# Patient Record
Sex: Male | Born: 1935 | Race: Black or African American | Hispanic: No | Marital: Married | Smoking: Never smoker
Health system: Southern US, Community
[De-identification: ages and names within clinical notes are randomized; demographics above are authoritative.]

## PROBLEM LIST (undated history)

## (undated) DIAGNOSIS — I10 Essential (primary) hypertension: Secondary | ICD-10-CM

## (undated) DIAGNOSIS — I509 Heart failure, unspecified: Secondary | ICD-10-CM

## (undated) DIAGNOSIS — N400 Enlarged prostate without lower urinary tract symptoms: Secondary | ICD-10-CM

## (undated) DIAGNOSIS — E119 Type 2 diabetes mellitus without complications: Secondary | ICD-10-CM

## (undated) DIAGNOSIS — E785 Hyperlipidemia, unspecified: Secondary | ICD-10-CM

## (undated) HISTORY — DX: Essential (primary) hypertension: I10

## (undated) HISTORY — DX: Type 2 diabetes mellitus without complications: E11.9

## (undated) HISTORY — DX: Hyperlipidemia, unspecified: E78.5

## (undated) HISTORY — PX: STOMACH SURGERY: SHX791

## (undated) HISTORY — PX: BACK SURGERY: SHX140

## (undated) HISTORY — PX: CARDIAC SURGERY: SHX584

---

## 2007-02-26 ENCOUNTER — Ambulatory Visit: Payer: Self-pay | Admitting: Internal Medicine

## 2007-03-06 ENCOUNTER — Ambulatory Visit: Payer: Self-pay | Admitting: Family Medicine

## 2007-03-08 ENCOUNTER — Ambulatory Visit: Payer: Self-pay | Admitting: Family Medicine

## 2007-03-12 ENCOUNTER — Ambulatory Visit: Payer: Self-pay | Admitting: Internal Medicine

## 2007-03-13 ENCOUNTER — Ambulatory Visit: Payer: Self-pay | Admitting: Family Medicine

## 2007-03-27 ENCOUNTER — Encounter (INDEPENDENT_AMBULATORY_CARE_PROVIDER_SITE_OTHER): Payer: Self-pay | Admitting: Neurosurgery

## 2007-03-27 ENCOUNTER — Inpatient Hospital Stay (HOSPITAL_COMMUNITY): Admission: RE | Admit: 2007-03-27 | Discharge: 2007-04-01 | Payer: Self-pay | Admitting: Neurosurgery

## 2007-03-29 ENCOUNTER — Ambulatory Visit: Payer: Self-pay | Admitting: Internal Medicine

## 2007-05-06 ENCOUNTER — Ambulatory Visit: Payer: Self-pay | Admitting: Unknown Physician Specialty

## 2007-05-06 ENCOUNTER — Encounter: Payer: Self-pay | Admitting: Gastroenterology

## 2007-06-11 ENCOUNTER — Ambulatory Visit: Payer: Self-pay | Admitting: Gastroenterology

## 2007-06-12 ENCOUNTER — Ambulatory Visit: Payer: Self-pay | Admitting: Neurosurgery

## 2007-06-14 ENCOUNTER — Ambulatory Visit: Payer: Self-pay | Admitting: Neurosurgery

## 2007-06-20 ENCOUNTER — Encounter: Payer: Self-pay | Admitting: Gastroenterology

## 2007-06-20 ENCOUNTER — Ambulatory Visit (HOSPITAL_COMMUNITY): Admission: RE | Admit: 2007-06-20 | Discharge: 2007-06-20 | Payer: Self-pay | Admitting: Gastroenterology

## 2007-06-28 ENCOUNTER — Ambulatory Visit: Payer: Self-pay | Admitting: Gastroenterology

## 2007-07-30 ENCOUNTER — Ambulatory Visit: Payer: Self-pay | Admitting: Gastroenterology

## 2007-09-18 DIAGNOSIS — I219 Acute myocardial infarction, unspecified: Secondary | ICD-10-CM | POA: Insufficient documentation

## 2007-09-18 DIAGNOSIS — I10 Essential (primary) hypertension: Secondary | ICD-10-CM | POA: Insufficient documentation

## 2007-09-18 DIAGNOSIS — D481 Neoplasm of uncertain behavior of connective and other soft tissue: Secondary | ICD-10-CM | POA: Insufficient documentation

## 2007-09-18 DIAGNOSIS — E119 Type 2 diabetes mellitus without complications: Secondary | ICD-10-CM | POA: Insufficient documentation

## 2007-09-27 ENCOUNTER — Inpatient Hospital Stay (HOSPITAL_COMMUNITY): Admission: RE | Admit: 2007-09-27 | Discharge: 2007-10-01 | Payer: Self-pay | Admitting: General Surgery

## 2007-09-27 ENCOUNTER — Encounter (INDEPENDENT_AMBULATORY_CARE_PROVIDER_SITE_OTHER): Payer: Self-pay | Admitting: General Surgery

## 2007-10-26 ENCOUNTER — Emergency Department: Payer: Self-pay | Admitting: Internal Medicine

## 2007-10-26 ENCOUNTER — Inpatient Hospital Stay (HOSPITAL_COMMUNITY): Admission: EM | Admit: 2007-10-26 | Discharge: 2007-11-07 | Payer: Self-pay | Admitting: Emergency Medicine

## 2007-10-30 ENCOUNTER — Encounter (INDEPENDENT_AMBULATORY_CARE_PROVIDER_SITE_OTHER): Payer: Self-pay | Admitting: General Surgery

## 2008-01-06 ENCOUNTER — Ambulatory Visit: Payer: Self-pay | Admitting: Neurosurgery

## 2008-03-26 ENCOUNTER — Ambulatory Visit: Payer: Self-pay

## 2008-05-13 ENCOUNTER — Encounter: Payer: Self-pay | Admitting: Gastroenterology

## 2008-10-05 ENCOUNTER — Ambulatory Visit: Payer: Self-pay

## 2008-10-13 IMAGING — CT CT CHEST-ABD-PELV W/ CM
1 of 3 series · 14 of 32 positions shown, 19 images · non-contrast
Comparison: none

REASON FOR EXAM: lumbar mass at L-3 and L-4  eval for metastatic disease
for chest
COMMENTS:

[Series 2: soft tissue · axial · 0.79mm/px · z∈[-69,+441]mm · 14 of 116 slices shown, 19 images]
[im 7/116  soft-tissue]
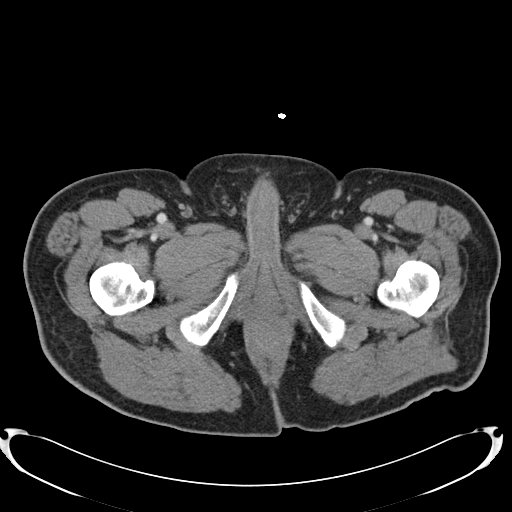
[im 7/116  bone]
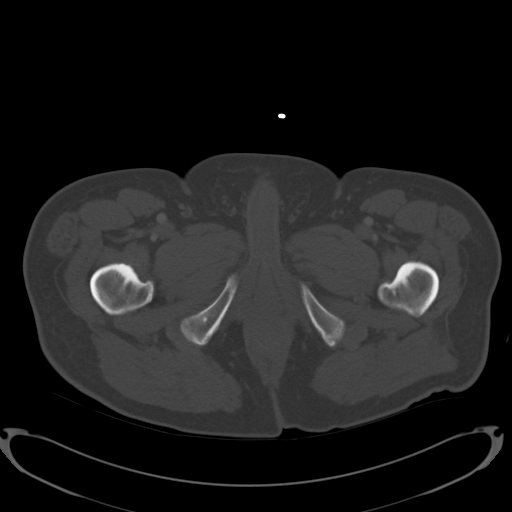
[im 19/116  soft-tissue]
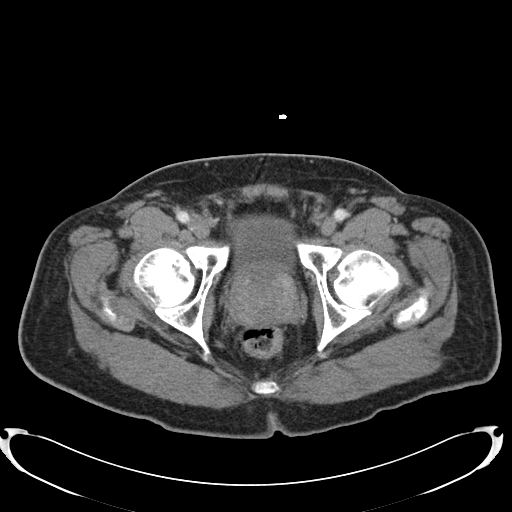
[im 25/116  soft-tissue]
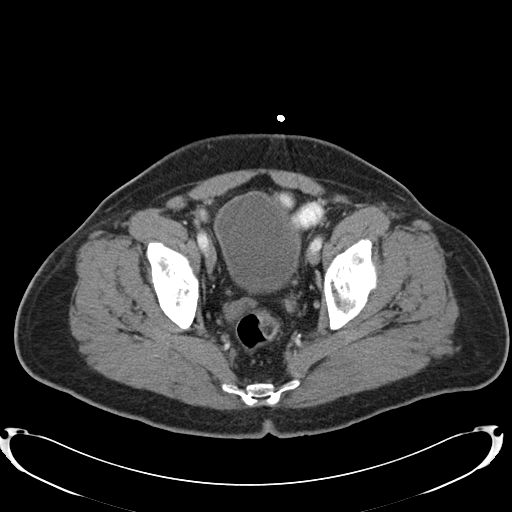
[im 31/116  soft-tissue]
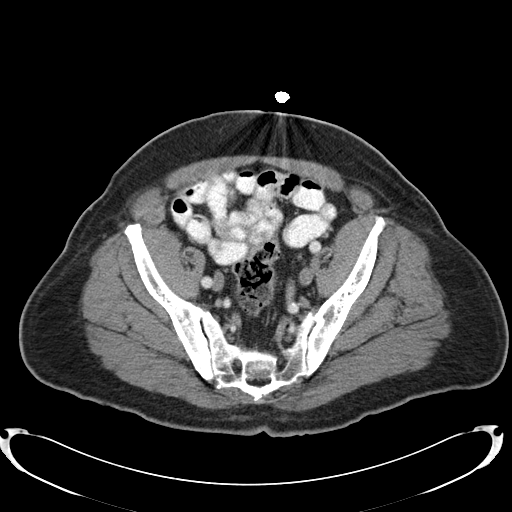
[im 43/116  soft-tissue]
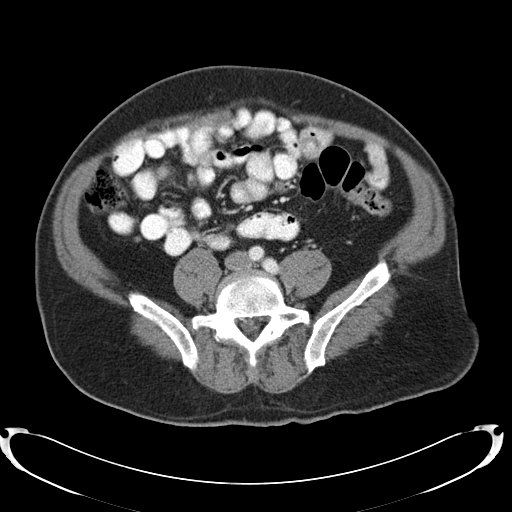
[im 49/116  soft-tissue]
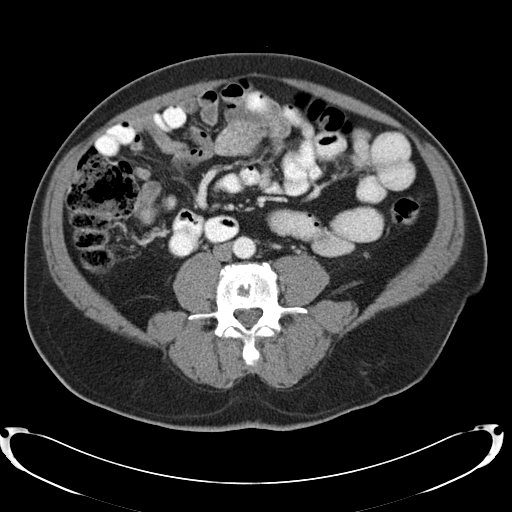
[im 61/116  soft-tissue]
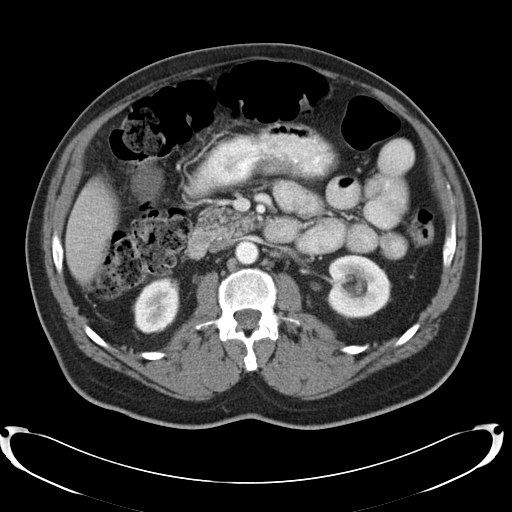
[im 67/116  soft-tissue]
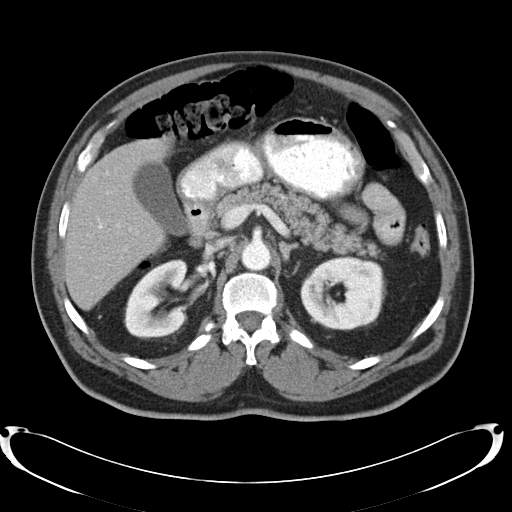
[im 73/116  soft-tissue]
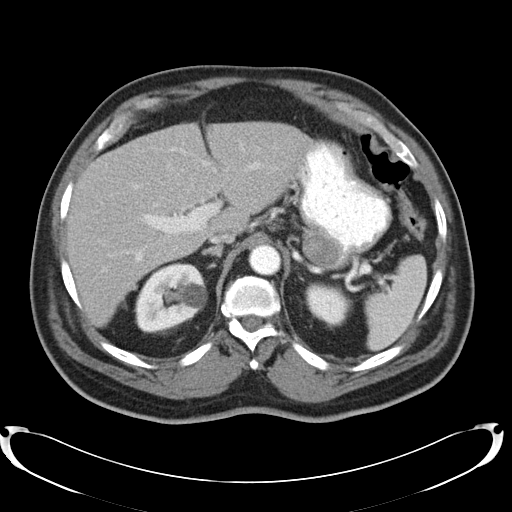
[im 73/116  bone]
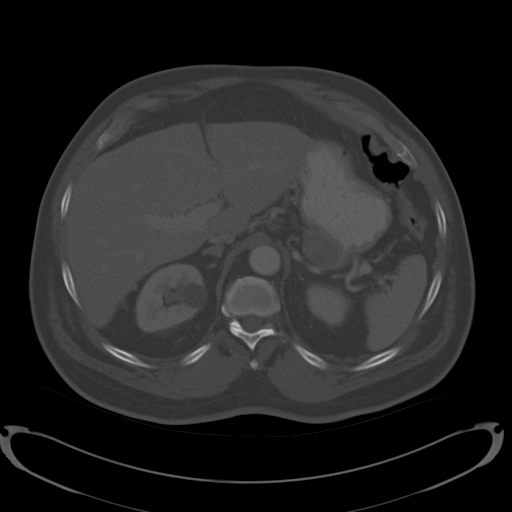
[im 85/116  soft-tissue]
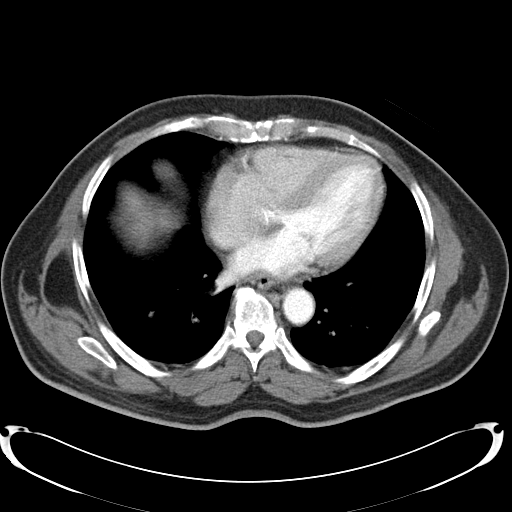
[im 91/116  soft-tissue]
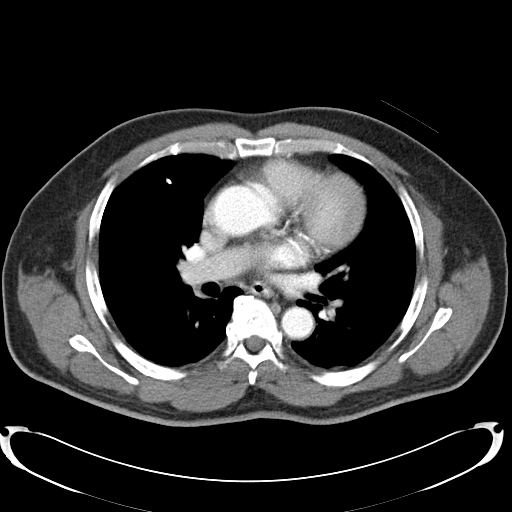
[im 91/116  lung]
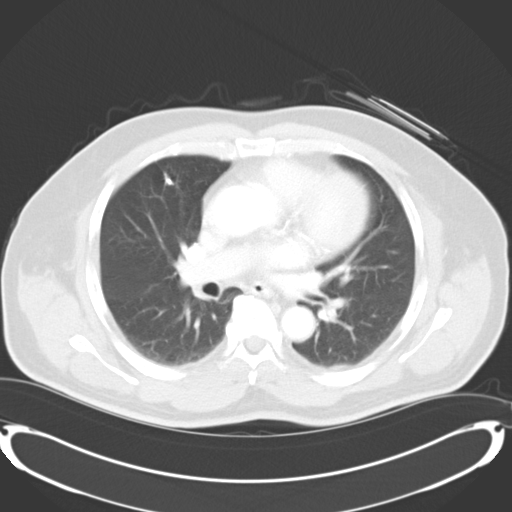
[im 97/116  soft-tissue]
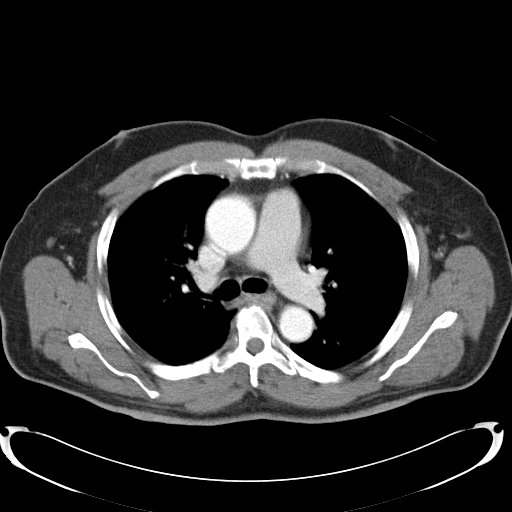
[im 97/116  lung]
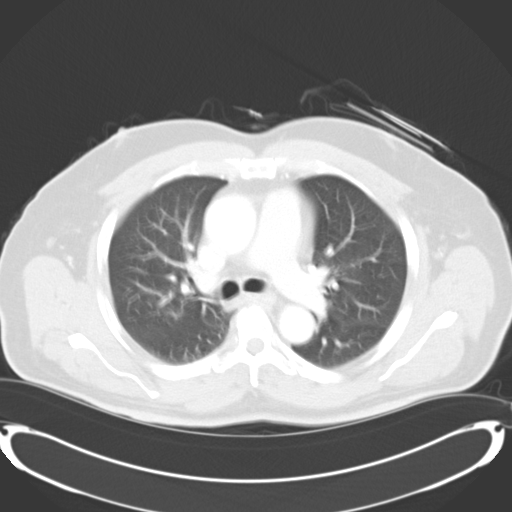
[im 103/116  lung]
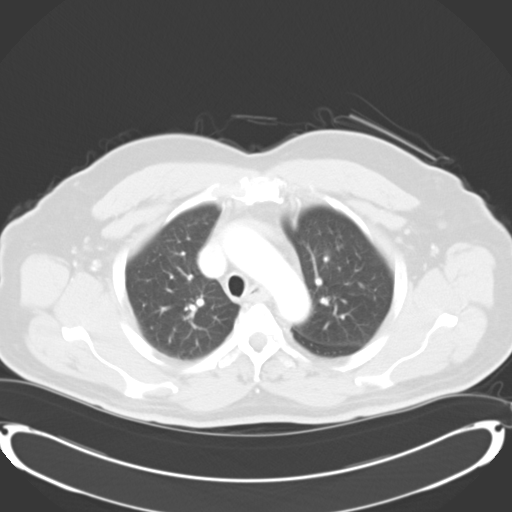
[im 109/116  soft-tissue]
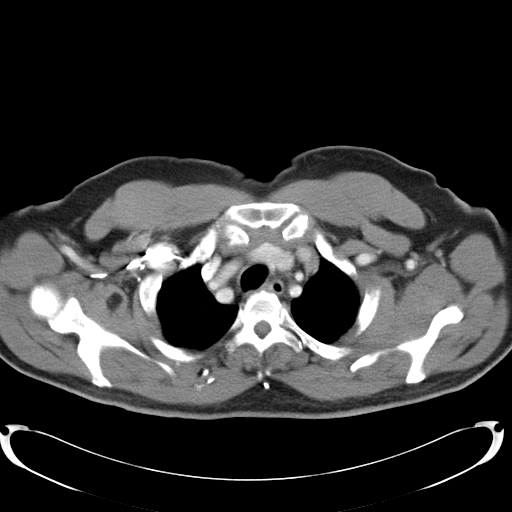
[im 109/116  lung]
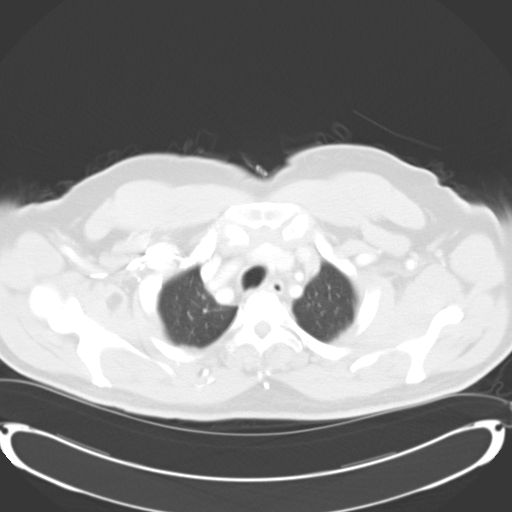

[14 of 32 positions shown; findings below may reference images not displayed]

PROCEDURE:     CT  - CT CHEST ABDOMEN AND PELVIS W  - March 13, 2007  [DATE]

RESULT:     CT of the chest, abdomen and pelvis with oral contrast and
intravenous contrast is performed. The patient has recent abnormal MRI of
the lumbar spine. Images of the chest demonstrate calcification in the RIGHT
upper lobe. There is no additional pulmonary abnormality. There is no
pleural effusion. There is what appears to be a nodular mass in the gastric
region near the fundus measuring 2.54 x 1.86 cm on image 44. Endoscopic
correlation with consideration for biopsy is recommended. This is in the
area of the gastric cardia near the gastroesophageal junction. The pancreas
appears to be normal. The gallbladder is present. No stones are present.
There is a cyst in the upper pole medial aspect of the RIGHT kidney. The
liver and spleen appear to be unremarkable. Degenerative spurring is present
in the thoracic spine. No abnormal bowel distention is seen. There is no
acute inflammatory change evident. The prostate appears to be enlarged.
There is fat within inguinal hernias bilaterally. There is a soft tissue
density over the LEFT hip region which could represent injection granuloma
on image 81. The oral contrast did not reach the colon but again no
obstruction or abnormal bowel distention is present.
IMPRESSION: 1. Granulomatous calcification in the RIGHT lung.
2. RIGHT renal  cyst.
3. Possible mass in the gastric cardia region. Endoscopic correlation is
suggested. No mesenteric or retroperitoneal adenopathy is appreciated.
3. Prostatic enlargement.
4. Fat within inguinal hernias.
5. The evaluation of the lungs is limited by respiratory motion artifact.

## 2008-11-04 ENCOUNTER — Encounter: Payer: Self-pay | Admitting: Gastroenterology

## 2008-11-04 ENCOUNTER — Telehealth (INDEPENDENT_AMBULATORY_CARE_PROVIDER_SITE_OTHER): Payer: Self-pay | Admitting: *Deleted

## 2008-11-11 ENCOUNTER — Ambulatory Visit: Payer: Self-pay | Admitting: Neurosurgery

## 2008-12-04 ENCOUNTER — Ambulatory Visit: Payer: Self-pay | Admitting: Gastroenterology

## 2008-12-11 ENCOUNTER — Encounter: Payer: Self-pay | Admitting: Gastroenterology

## 2008-12-11 ENCOUNTER — Ambulatory Visit: Payer: Self-pay | Admitting: Gastroenterology

## 2008-12-14 ENCOUNTER — Encounter: Payer: Self-pay | Admitting: Gastroenterology

## 2009-05-24 ENCOUNTER — Encounter: Payer: Self-pay | Admitting: Gastroenterology

## 2009-05-28 IMAGING — CT CT ABD-PELV W/ CM
1 of 3 series · 14 of 32 positions shown, 19 images · non-contrast
Comparison: none

REASON FOR EXAM: Abdominal and pelvic pain
COMMENTS:

[Series 2: abdomen · axial · 0.74mm/px · z∈[-520,-136]mm · 14 of 54 slices shown, 19 images]
[im 3/54  soft-tissue]
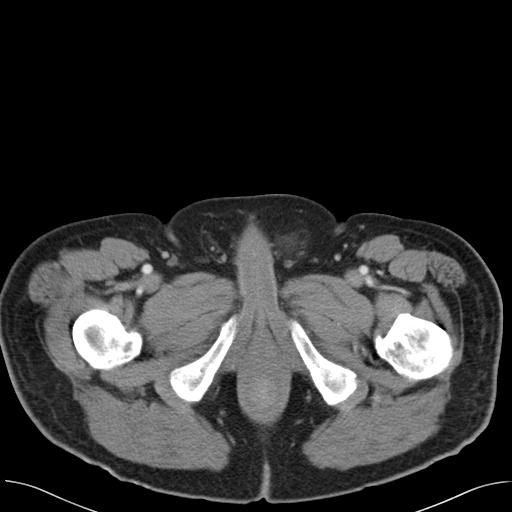
[im 3/54  bone]
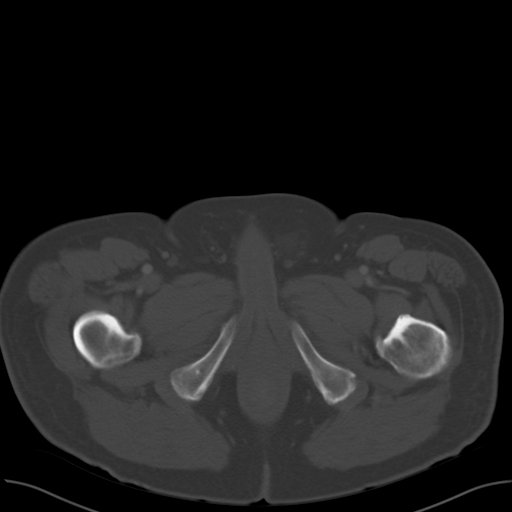
[im 9/54  soft-tissue]
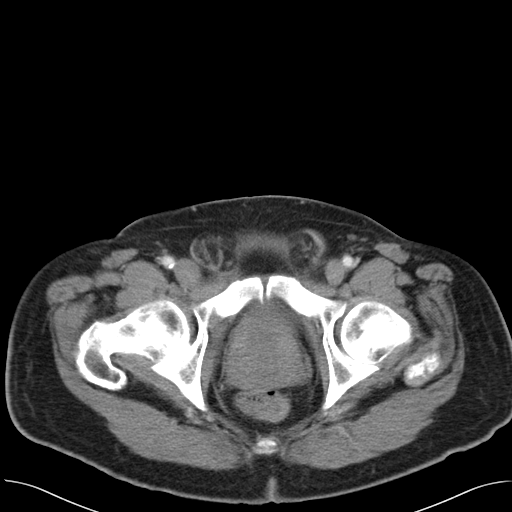
[im 12/54  soft-tissue]
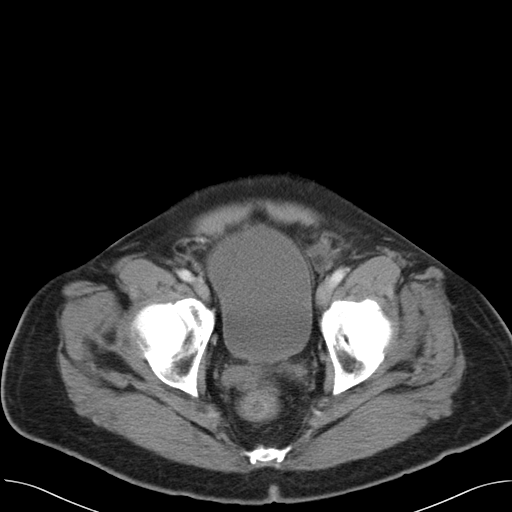
[im 14/54  soft-tissue]
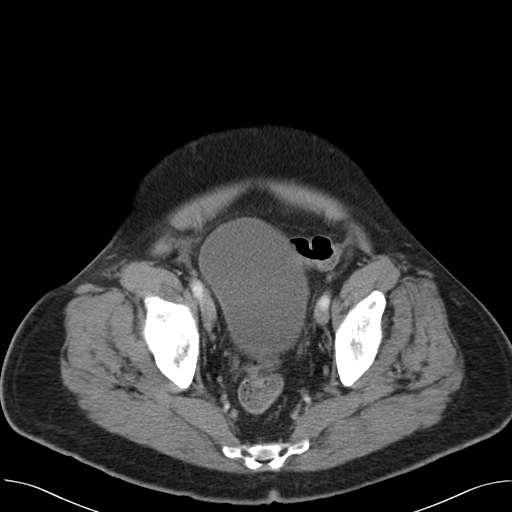
[im 20/54  soft-tissue]
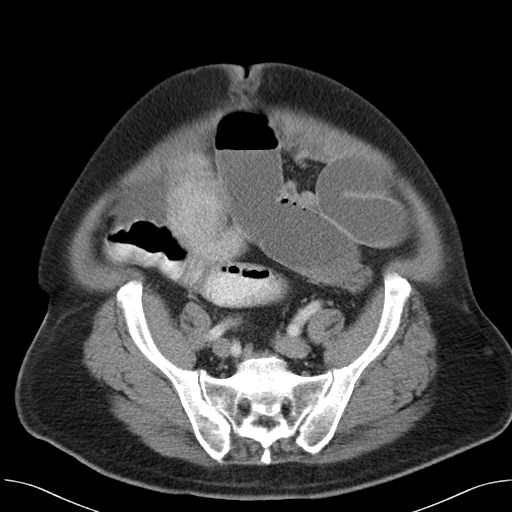
[im 23/54  soft-tissue]
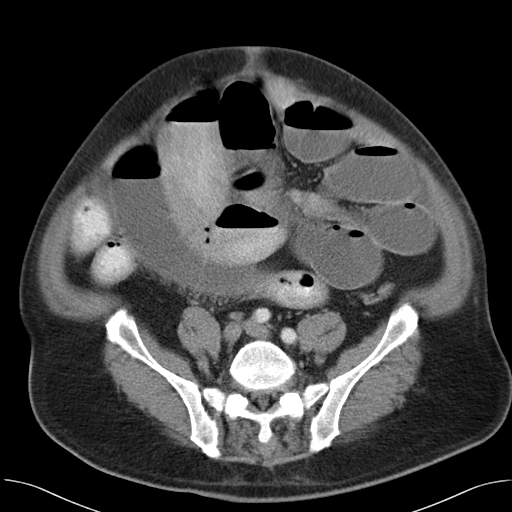
[im 28/54  soft-tissue]
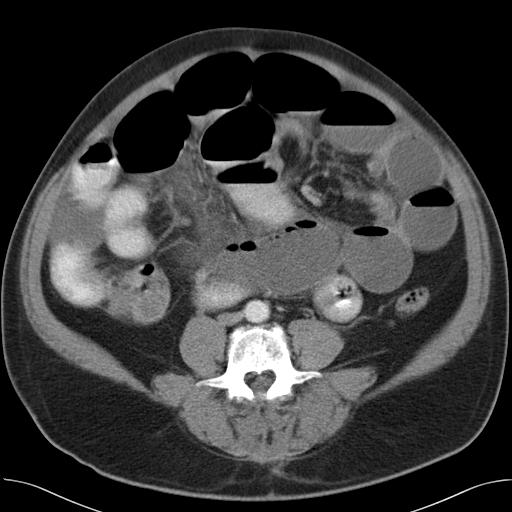
[im 31/54  soft-tissue]
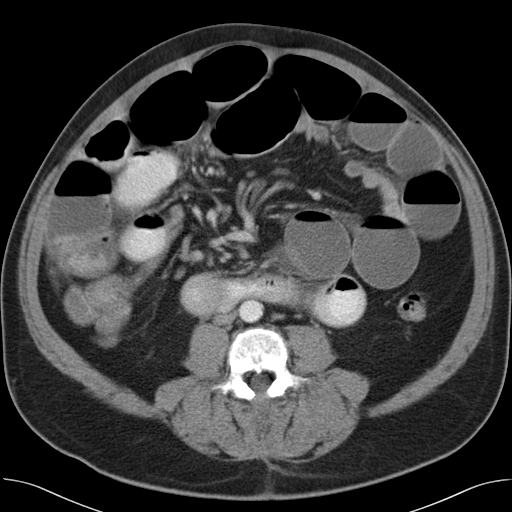
[im 34/54  soft-tissue]
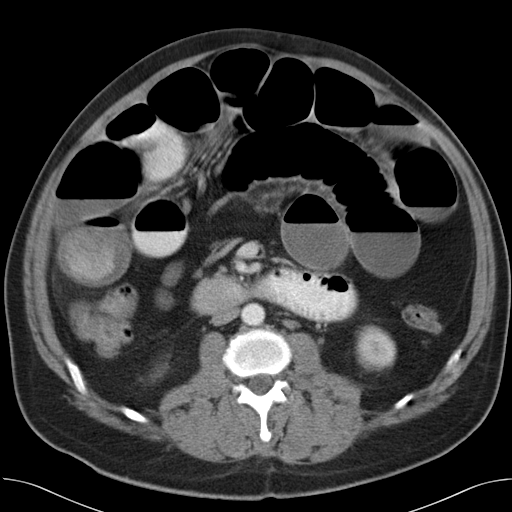
[im 34/54  bone]
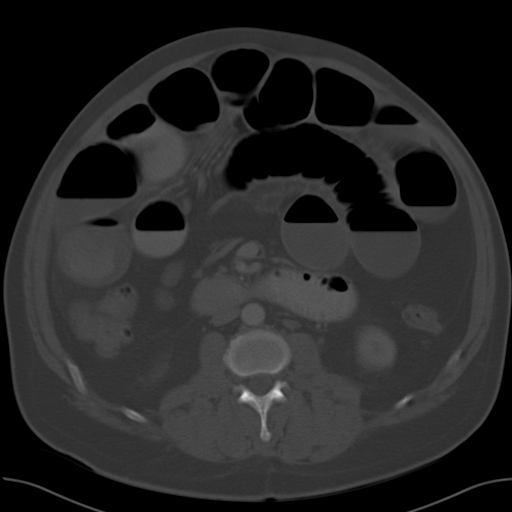
[im 40/54  soft-tissue]
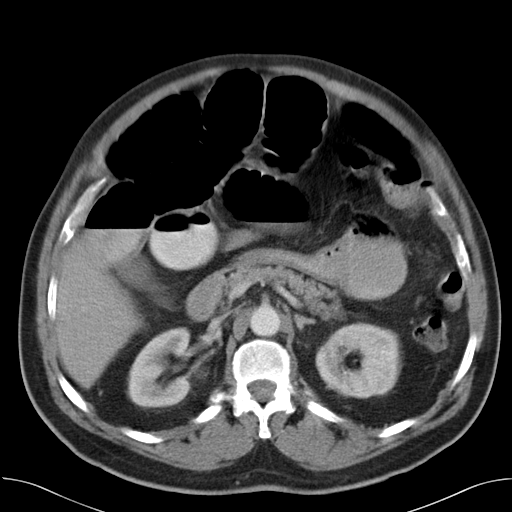
[im 42/54  soft-tissue]
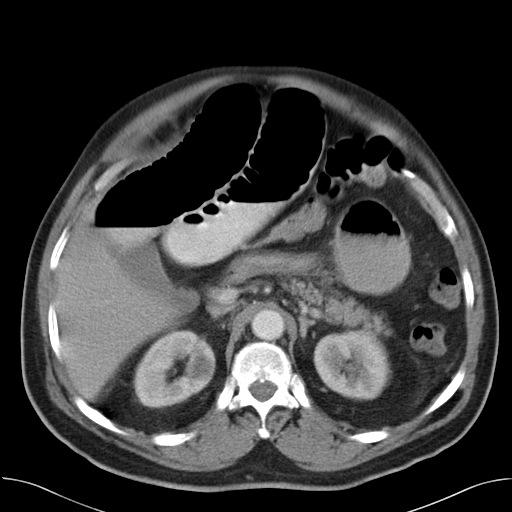
[im 42/54  lung]
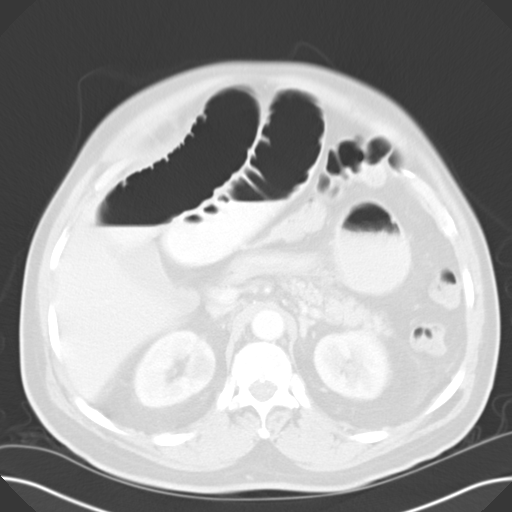
[im 45/54  soft-tissue]
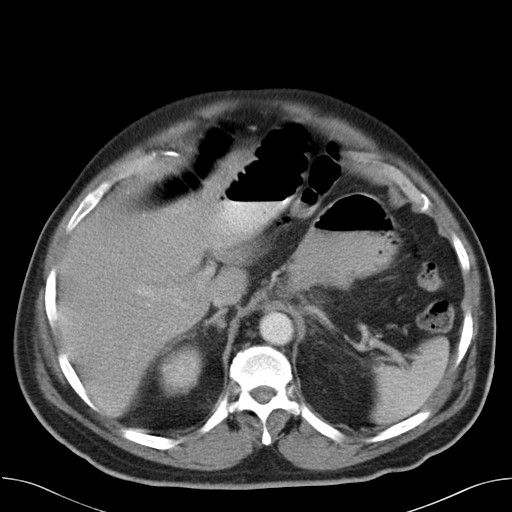
[im 45/54  lung]
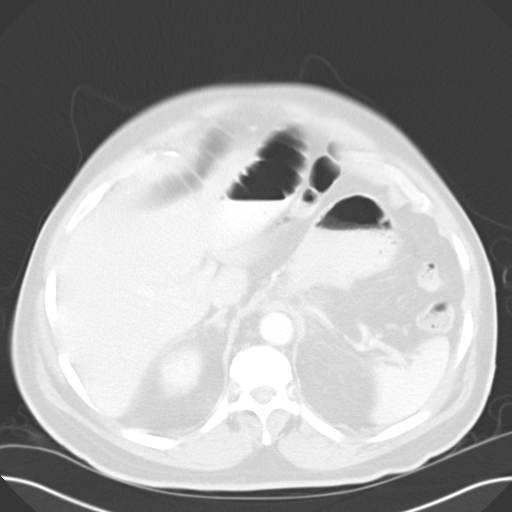
[im 48/54  lung]
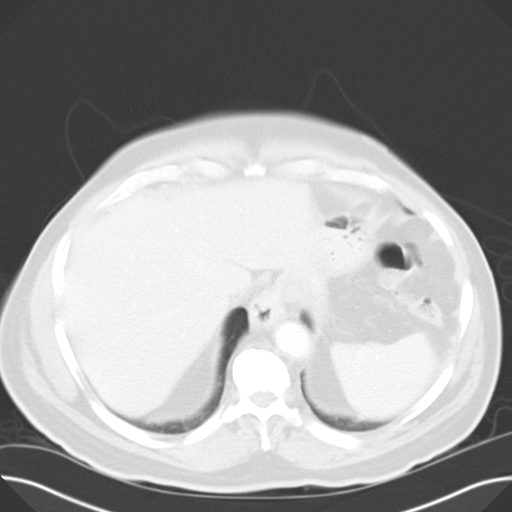
[im 51/54  soft-tissue]
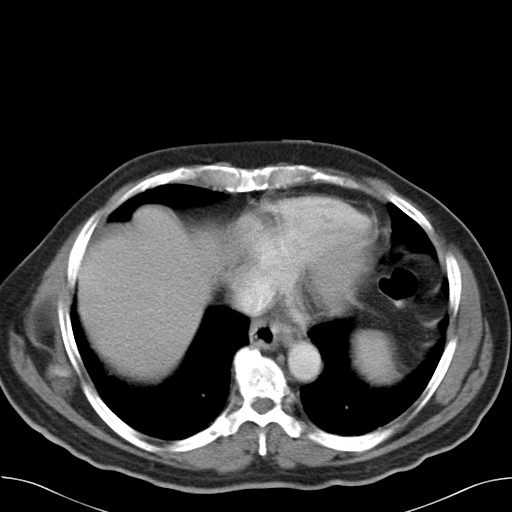
[im 51/54  lung]
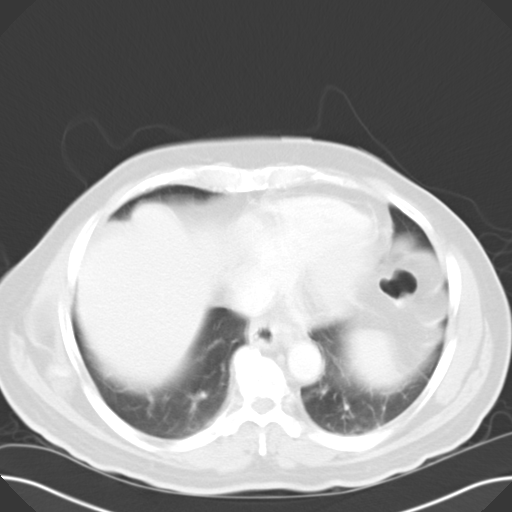

[14 of 32 positions shown; findings below may reference images not displayed]

PROCEDURE:     CT  - CT ABDOMEN / PELVIS  W  - October 26, 2007  [DATE]
RESULT:     Helical, 8.0 mm sections were obtained from the lung bases
through the pubic symphysis status post intravenous administration of 100 ml
of [id number] and oral contrast.

Comparison is made to a prior study dated 03/13/2007.

Evaluation of the lung bases demonstrates mild thickening of the
interstitial markings.

The liver, spleen, adrenals, pancreas and kidneys are unremarkable.

Multiple, dilated fluid filled loops of small bowel are appreciated
throughout the abdomen and pelvis. There are loops of contrast filled bowel
within the proximal portions of the bowel. There appears to be a transition
point within the LEFT lower quadrant. The colon is decompressed.

The celiac, SMA, portal vein, SMV and IMA are opacified. There is no
evidence of abdominal or pelvic masses, free fluid or loculated fluid
collections.
IMPRESSION: 1.     Findings consistent with small bowel obstruction. The transition
point appears to be within the LEFT lower quadrant with the region of the
distal small bowel. There does not appear to be CT evidence of pneumatosis
2.     Dr. Giuliano of the Emergency Department was informed of these
findings at the time of the initial interpretation.
3.     There is no evidence of free air. No evidence of masses, adenopathy,
free fluid or loculated fluid collections is appreciated.
4.     Note, an etiology such as an adhesion is a diagnostic consideration,
particularly if the patient has a history of prior surgery.

## 2009-06-01 ENCOUNTER — Ambulatory Visit: Payer: Self-pay | Admitting: General Surgery

## 2009-12-30 ENCOUNTER — Ambulatory Visit: Payer: Self-pay | Admitting: Internal Medicine

## 2010-01-26 ENCOUNTER — Ambulatory Visit: Payer: Self-pay | Admitting: Internal Medicine

## 2010-02-25 ENCOUNTER — Ambulatory Visit: Payer: Self-pay | Admitting: Internal Medicine

## 2010-05-20 ENCOUNTER — Ambulatory Visit: Payer: Self-pay | Admitting: General Surgery

## 2010-06-01 ENCOUNTER — Encounter: Payer: Self-pay | Admitting: Gastroenterology

## 2010-09-28 NOTE — Letter (Signed)
Summary: [facility]   Imported By: Phillis Knack 06/15/2010 14:54:27  _____________________________________________________________________  External Attachment:    Type:   Image     Comment:   External Document

## 2010-12-07 LAB — GLUCOSE, CAPILLARY
Glucose-Capillary: 154 mg/dL — ABNORMAL HIGH (ref 70–99)
Glucose-Capillary: 172 mg/dL — ABNORMAL HIGH (ref 70–99)

## 2011-01-10 NOTE — Op Note (Signed)
Bill Peterson, Bill Peterson NO.:  [id number]   MEDICAL RECORD NO.:  [id number]          PATIENT TYPE:  INP   LOCATION:  5036                         FACILITY:  [facility]   PHYSICIAN:  Odis Hollingshead, M.D.DATE OF BIRTH:  1934-11-10   DATE OF PROCEDURE:  10/30/2007  DATE OF DISCHARGE:                               OPERATIVE REPORT   PRESENTING PROBLEM:  Small-bowel obstruction.   POSTOPERATIVE DIAGNOSIS:  Small-bowel obstruction, secondary to Richter  type hernia.   PROCEDURE:  Exploratory laparotomy, lysis of adhesions, small bowel  resection.   SURGEON:  Odis Hollingshead, M.D.   ASSISTANT:  Sammuel Hines. Daiva Nakayama, M.D.   ANESTHESIA:  General.   INDICATIONS:  This is a 76 year old male who over a month ago underwent  a laparoscopic excision of a gastric tumor and did well until  approximately one week ago when he developed some abdominal bloating and  distention.  Laxatives helped initially but he then began having a  significant amount of vomiting, went to [facility], was found to have a small bowel obstruction and was admitted to  [facility].  He had some clinical improvement until today when he  developed more discomfort and fever and x-rays were unchanged with small  bowel obstruction.  He is now brought to the operating room because of  this.   TECHNIQUE:  He was brought to the operating room, placed supine on the  operating room.  General anesthetic was administered.  Foley catheter  was inserted.  The abdominal wall sterilely prepped and draped.  I made  a limited midline incision beginning above the umbilicus and extending  it below.  He had a previous supraumbilical incision closed with Vicryl  suture.  I dissected subcutaneous tissue and divided the fascia below  the umbilicus.  As I approached the area of scar above the umbilicus,  the tissue was very firm and indurated.  As I dissected through the  tissue I noted small bowel  mucosa.  This was up into the subcutaneous  tissue.  I could see a disrupted suture next to this.  It appears that  with some postop coughing and movement he disrupted the suture and he  had a small antimesenteric section of the small bowel stuck up into the  incision leading to his hernia.  The bowel proximal to this was dilated,  distal this was decompressed.  I freed this segment up.  I subsequently  then took the GIA stapler and performed a side-to-side anastomosis using  the enterotomy that was made.  I then resected dissection with the  enterotomy using a linear non cutting stapler.  The anastomosis was  patent, viable and under no tension.   I then began running the small bowel proximal and there were adhesions  between small bowel loops.  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  I lysed  some adhesions that appeared to be similar to Ladd's type bands, freeing  this up, allowing it to go  back and then rotating the intestine so it  fell back into the right lower quadrant.  I ran the entire small bowel  and milked some of the small bowel contents back to the stomach and  evacuated them decompressing the small bowel.  No other points of  obstruction were noted.  I made sure that small bowel was placed back  into the abdominal cavity in proper orientation with respect with  respect to the mesentery.   I then irrigated out the abdominal cavity copiously with saline solution  and evacuated.  Hemostasis was adequate.   Following this, the fascia was closed with a running #1 PDS suture with  intermittent #2 nylon suture retention type sutures.  Subcutaneous  tissue was irrigated.  Skin was closed with staples.  I then used a red  rubber catheter to form a bridge and the retention sutures were tied  down, further reinforcing the closure.  Sterile dressing was applied.  After closure, the nurses reported that a right angle  clamp was missing  from their count.  We had not used a right angle clamp the entire  operation.  Subsequently an x-ray has been ordered.  I will dictate an  addendum based on the x-ray findings but we do not remember leaving any  the instruments in the abdomen and again a right angle clamp was not  used during the operation.      Odis Hollingshead, M.D.  Electronically Signed     TJR/MEDQ  D:  10/30/2007  T:  10/31/2007  Job:  [id number]

## 2011-01-10 NOTE — Consult Note (Signed)
Bill Peterson, Bill Peterson NO.:  [id number]   MEDICAL RECORD NO.:  [id number]          PATIENT TYPE:  INP   LOCATION:  3034                         FACILITY:  [facility]   PHYSICIAN:  Vernell Leep, MD     DATE OF BIRTH:  10-31-1934   DATE OF CONSULTATION:  03/29/2007  DATE OF DISCHARGE:                                 CONSULTATION   REQUESTING PHYSICIAN:  Dr. Kary Kos of neurosurgery.   PRIMARY CARE PHYSICIAN:  Dr. Frazier Richards of [facility] in  [place], [place].   CARDIOLOGIST:  Dr. Lujean Amel, [place], [place].   REASON FOR CONSULTATION:  Tachycardia and hypertension postoperatively.   CHIEF COMPLAINT:  Mild intermittent low back pain at operated site and  diarrhea.   HISTORY OF PRESENT ILLNESS:  Mr. Mowbray is a pleasant, 76 year old,  African-American male patient with past medical history as indicated  below. He is status post decompressive lumbar laminectomy L2-3, L3-4,  and L4-5 bilaterally with intradural exploration for resection of  intradural mass and then L4-5 microdiskectomy from the left,  postoperative day #2. The patient had an episode of mild ileus yesterday  with associated abdominal stiffness, nausea with a couple of episodes of  vomiting, poor oral intake, hiccups. For this, the patient was provided  with stool softeners and Fleets enemas. Since this morning, the patient  has had multiple (6) episodes of copious liquid stools. His abdominal  stiffness and nausea and vomiting have resolved. He is tolerating clear  liquids orally. The patient still has persistent hiccups. He complains  of some dry mouth. He also complains of mild intermittent low back pain  on trying to turn. His preoperative symptom of left lower extremity pain  has resolved. For persistent sinus tachycardia in the 130s and elevated  blood pressures, we were asked to assist.   PAST MEDICAL HISTORY:  1. Type 2 diabetes with hemoglobin A1c said to be 7.  2.  Hypertension.  3. Hyperlipidemia.  4. Status post myocardial infarction in 1996 status post angioplasty      in 1996.  5. Negative stress test on March 25, 2007.  6. For elevated PSA, the patient had a prostate biopsy which was said      to be negative for malignancy.   PAST SURGICAL HISTORY:  None prior to this surgery.   ALLERGIES:  No known drug allergies.   HOME MEDICATIONS:  1. Multivitamins 1 p.o. daily.  2. Actos 30 mg p.o. daily.  3. Janumet 50/1000, 1 tablet p.o. twice daily.  4. Glipizide XL 5 mg p.o. one daily.  5. Caduet 10/20mg , one p.o. daily.  6. Aspirin 325 mg p.o. daily.  7. Lisinopril 20 mg p.o. daily.  8. Hydrocodone APAP 5/500, one to two tablets p.o. q.4 h p.r.n.   FAMILY HISTORY:  1. mother died at age 42 from a heart attack.  2. father died at age 40 from diabetes complications.   SOCIAL HISTORY:  The patient is married and has 3 adult kids. 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 There is no history of smoking,  alcohol  or drug abuse.   REVIEW OF SYSTEMS:  Over 10 systems reviewed and is noncontributory.  Specifically, the patient denies any history of fever, chills or rigors,  chest pain, palpitations, dyspnea.   PHYSICAL EXAMINATION:  Mr. Hohman is a moderately built, nourished male  patient lying comfortably supine in bed with no obvious distress.  VITAL SIGNS:  His temperature is 99.3 degrees Fahrenheit, pulse of 135  per minute and regular, respirations 20 per minute, blood pressure  179/92. Oxygen saturation of 95% on room air CBG 238/249/145 mg/DL.  HEENT:  Nontraumatic, normocephalic. Pupils equally reacting to light  and accommodation. Bilateral cataracts. Oral cavity with multiple  missing teeth and other teeth with filling. No oral pharyngeal erythema  or thrush.  NECK:  Without JVD, carotid bruit, lymphadenopathy or goiter. Supple.  RESPIRATORY:  Clear to auscultation.  CARDIOVASCULAR:  First and second heart sounds heard.  Tachycardic,  regular rate and rhythm. No third or fourth heart sounds or murmurs or  rubs or gallops or clicks.  ABDOMEN:  Obese but not tender, no organomegaly or mass appreciated.  Bowel sounds are normally heard. Soft yellow stools.  CENTRAL NERVOUS SYSTEM:  The patient is awake, alert, oriented x3 with  no focal neurological deficits.  EXTREMITIES:  No cyanosis, clubbing or edema. Peripheral pulses are  symmetrically felt. The patient has compression stockings to the thighs.  MUSCULOSKELETAL:  Operative site dressing clean, dry and intact.   LABORATORY DATA:  Complete metabolic panel is remarkable for glucose of  266, BUN 14, creatinine 1.31, albumin of 2.9. CBC is remarkable for a  white cell of 11.3, hemoglobin 13.8, hematocrit 40.8, platelet of 176.   The patient's EKG with sinus tachycardia are 135 beats per minute with  some questionable Q waves in inferior lead 3 and lead aVF which is  probably rate related. There are no other acute changes.   ASSESSMENT/PLAN:  1. Asymptomatic sinus tachycardia - probably secondary to dehydration      secondary to poor oral intake, vomiting on July 31 and diarrhea      today secondary to his medications for ileus. Will hold the      laxatives and stool softeners for today. Will increase his IV fluid      hydration. For completion will obtain a TSH level. Follow also for      adequate pain control.  2. Hypertension. To continue the patient's Norvasc and lisinopril.      Agree with adding low dose metoprolol which is yet to be given.      Monitor blood pressure.  3. Uncontrolled type 2 diabetes. The control is probably worse      secondary to his current postoperative stress, recent use of      steroids. Will continue Actos, Januvia, metformin and his sliding      scale insulin. Will add low dose Lantus. To monitor closely his      CBGs.  4. Dyslipidemia. To continue Zocor.  5. Postoperative ileus which is clinically resolved at this time,  to      hold his Colace, Senokot and Fleets enemas for today.  6. History of coronary artery disease status post myocardial      infarction with no cardiorespiratory symptoms and      stable. To resume the patient's enteric coated aspirin as soon as      possible by the primary team.  7. Mild leukocytosis that is probably related to stress with no  clinical focus of sepsis. Will however check the patient's      urinalysis and a chest x-ray.      Vernell Leep, MD  Electronically Signed     AH/MEDQ  D:  03/29/2007  T:  03/29/2007  Job:  [id number]   cc:   Kary Kos, M.D.  Frazier Richards, MD

## 2011-01-10 NOTE — Assessment & Plan Note (Signed)
[facility] OFFICE NOTE   Bill Peterson, Bill Peterson                        MRN:          [id number]  DATE:06/11/2007                            DOB:          1935-04-14    NEW GI CONSULTATION:  Patient was referred by Dr. Gaylyn Cheers from  the [facility] gastroenterology.   PRIMARY CARE PHYSICIAN:  Dr. Ouida Sills at the [facility].   REASON FOR REFERRAL:  Dr. Vira Agar asked me to evaluate Bill Peterson in  consultation regarding subepithelial gastric mass.   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  As workup for this, he had some scans at [facility].  He tells me one of the CAT scans suggested a mass in his upper GI tract.  This is followed by an EGD by Dr. Verta Ellen. This EGD found an  approximately 3 cm subepithelial bulge in his gastric body.  Biopsies of  the overlying mucosa were normal.   Bill Peterson seemed to have no symptoms from this mass.  No nausea.  No  vomiting.  No specific abdominal pain.  He has had no GI bleeding.   REVIEW OF SYSTEMS:  Notable for a weight loss since his back surgery,  otherwise is essentially normal and is available on his nursing intake  sheet.   PAST MEDICAL HISTORY:  1. Hypertension.  2. Status post acute MI in 1996.  3. Diabetes.  4. Status post angioplasty, I believe, in 1996.  5. Back surgery in July, 2008, as above.  6. Sub-epithelial mass, as above.   CURRENT MEDICATIONS:  Actos, aspirin, Caduet, Glucotrol, Janamet,  lisinopril, multivitamin, and omeprazole.   ALLERGIES:  PRANDIN.   SOCIAL HISTORY:  Married with three children.  Nonsmoker, nondrinker.   FAMILY HISTORY:  No colon cancer or colon polyps in the family.   PHYSICAL EXAMINATION:  Height 5 feet 6 inches.   Weight 192 pounds.  Blood pressure 148/82, pulse 76.  CONSTITUTIONAL:  Generally well-appearing.  ABDOMEN:  Soft, nontender, nondistended.  Normal bowel sounds.  EXTREMITIES:  No lower extremity edema.  EYES:  Extraocular movements are intact.  MOUTH:  Oropharynx moist.  No lesions.  NECK:  Supple.  No lymphadenopathy.  HEART:  Regular rate and rhythm.  LUNGS:  Clear to auscultation bilaterally.  SKIN:  No rash or lesions on visible extremities.   ASSESSMENT/PLAN:  A 76 year old man with subepithelial gastric mass,  appears to be incidentally found at during a workup for back problems.  I reviewed the EGD images myself, and there is certainly a large bulging  inward of his gastric mucosa.  I will arrange for him to have an EGD  with endoscopic ultrasound and fine needle aspiration of this lesion in  the next 1-2 weeks.  Possibilities include leiomyoma, leiomyosarcoma,  gist tumor.  Given its size of probably 3 cm, I suspect he is going to  need surgical  resection.  I see no reason for any further blood tests or  imaging studies.  Bill Peterson will bring the CT scan images with him on  disk when he comes for his procedure.     Milus Banister, MD  Electronically Signed    DPJ/MedQ  DD: 06/11/2007  DT: 06/11/2007  Job #: [id number]   cc:   Gaylyn Cheers

## 2011-01-10 NOTE — Op Note (Signed)
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   POSTOPERATIVE DIAGNOSIS:  Gastric gastrointestinal stromal tumor.   PROCEDURE:  Laparoscopic resection of gastric gastrointestinal stromal  tumor (GIST).   SURGEON:  Odis Hollingshead, M.D.   ASSISTANT:  Darene Lamer. Hoxworth, M.D.   ANESTHESIA:  General.   INDICATIONS:  Mr. Clement is a 76 year old male undergoing evaluation  for back pain and to have back surgery.  Incidental note was made of a  subepithelial gastric mass on part of his radiologic evaluation from a  CT scan.  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  He now presents for  elective resection.   TECHNIQUE:  He was seen in the holding area and brought to the operating  room, placed supine on the operating table, and general anesthetic was  administered.  Foley catheter was placed in the bladder.  Nasogastric  tube was inserted.  The abdominal wall was sterilely prepped and draped.  A small supraumbilical incision was made through the skin, subcutaneous  tissue, fascia and peritoneum.  A pursestring suture of 0 Vicryl was  placed around the fascial edges.  A Hasson trocar was introduced into  the peritoneal cavity and pneumoperitoneum was created by insufflation  of CO2 gas.   Next the laparoscope was introduced.  I then placed two 5-mm trocars in  the right upper quadrant and two in the left upper quadrant.  I  approached the body and fundus of the gallbladder.   This tumor was  posteriorly in the fundal area.  I began at the body-fundus junction and  divided the short gastrics entering the lesser sac up toward the left  crus.  We were then able to identify the tumor.  I rotated the stomach  to the right, exposing the posterior wall, and dissected posterior  tissue away from the tumor.  I divided some of the short gastrics on the  body of the stomach to allow for me to rotate it more toward the right.   Once I had adequate exposure, I then used the harmonic scalpel to make a  gastrotomy.  I then used the harmonic scalpel to excise the tumor with a  rim of  normal gastric tissuearound it and then placed the tumor in an  Endopouch bag.   This left me with a gastric defect.  I closed this gastric defect with a  running 2-0 silk suture.  The suture line was solid and viable.  I then  applied Tisseel to the suture line.  Following this I made sure that the  NG tube was in  adequate position.   The area was inspected.  Before Tisseel was used, I did perform  irrigation and evacuate the fluid.  There was no damage to the liver or  other viscera.   Following this I then removed the specimen through the supraumbilical  port and closed the supraumbilical fascial defect under laparoscopic  vision by tightening up and tying down the pursestring suture.  The  remaining trocars were removed and the pneumoperitoneum was released.  The skin incisions were all closed with 4-0 Monocryl subcuticular  stitches, followed by Steri-Strips and sterile dressings.   He tolerated the procedure well without any apparent complications and  was taken to recovery in satisfactory condition.      Odis Hollingshead, M.D.  Electronically Signed     TJR/MEDQ  D:  09/27/2007  T:  09/27/2007  Job:  [id number]   cc:   Milus Banister, MD  Frazier Richards, MD  Gaylyn Cheers, MD

## 2011-01-10 NOTE — Op Note (Signed)
NAMEJOHNSON, NELLESSEN NO.:  [id number]   MEDICAL RECORD NO.:  [id number]          PATIENT TYPE:  INP   LOCATION:  5036                         FACILITY:  [facility]   PHYSICIAN:  Odis Hollingshead, M.D.DATE OF BIRTH:  16-Dec-1934   DATE OF PROCEDURE:  10/30/2007  DATE OF DISCHARGE:                               OPERATIVE REPORT   ADDENDUM:  Mr. Stukes had the abdominal x-ray in the operating room because of  incorrect instrument count, an instrument that we did not use.  On my  review there is no evidence of retained instrument.      Odis Hollingshead, M.D.  Electronically Signed     TJR/MEDQ  D:  10/30/2007  T:  10/30/2007  Job:  [id number]

## 2011-01-10 NOTE — Assessment & Plan Note (Signed)
[facility]                         GASTROENTEROLOGY OFFICE NOTE   ZYMARI, BAYER                        MRN:          [id number]  DATE:07/30/2007                            DOB:          1934-09-12    PRIMARY CARE PHYSICIAN:  Dr. Ouida Sills at the [facility].   PRIMARY GASTROENTEROLOGIST:  Dr. Sumner Boast at [facility].   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  This was noted incidentally to work  up some back pains by CT scan.  Followed up with an EGD by Dr. Sumner Boast who saw a submucosa bulging in his proximal gastric mucosa,  referred for endoscopic ultrasound which I performed June 20, 2007,  finding a 2.9 cm hypoechoic round mass sampled by FNA.  Aspirate showed  spindle cell neoplasm, CK positive, CD 34 positive with a GIST.   INTERVAL HISTORY:  I last saw Mr. Shackley at the time of his EUS  procedure about six weeks ago.  I spoke with him on the phone about  getting him referred to a surgeon to consider likely resection.  He  deferred at that point that he had a lot of other personal family and  health issues to deal with.  He presents now to arrange that referral.  He feels fine.  He is even more recovered from his back surgery that was  in July 2008.  His spinal surgeon recommended that he could set up with  Dr. Jackolyn Confer at [facility].   CURRENT MEDICATIONS:  Actos, aspirin, Caduet, Glucotrol, Janumet,  Lisinopril, multivitamin, omeprazole.   PHYSICAL EXAMINATION:  VITAL SIGNS:  Blood pressure 168/88, pulse 90.  CONSTITUTIONAL:  Generally, well-appearing.  ABDOMEN:  Soft, nontender, nondistended.  Normal bowel sounds.   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   This GIST tumor is greater than 2 cm which is usually the threshold for  recommending surgical resection.  He does have diabetes and hypertension  and an acute MI in 1996, but he is actually  in fairly good health.  I  think he would tolerate a likely wedge resection of this gastric mass  fairly well.  He has been recommended to Dr. Jackolyn Confer, which I am  happy to arrange for  him.  The patient knows to bring the CD copy of his CT scan, and we will  forward all our notes about this GIST.     Milus Banister, MD  Electronically Signed    DPJ/MedQ  DD: 07/30/2007  DT: 07/30/2007  Job #: [id number]   cc:   Odis Hollingshead, M.D.  Gaylyn Cheers  Dr. Ouida Sills, [facility]

## 2011-01-10 NOTE — H&P (Signed)
NAMESEIKO, Bill Peterson NO.:  [id number]   MEDICAL RECORD NO.:  [id number]          PATIENT TYPE:  INP   LOCATION:  5036                         FACILITY:  [place]   PHYSICIAN:  Marland Kitchen T. Peterson, M.D.DATE OF BIRTH:  [birth year]   DATE OF ADMISSION:  10/26/2007  DATE OF DISCHARGE:                              HISTORY & PHYSICAL   CHIEF COMPLAINT:  Abdominal swelling, nausea, vomiting.   HISTORY OF PRESENT ILLNESS:  Bill Peterson Is a 76 year old African-  American male who is just 1 month status post laparoscopic resection of  a gastric GIST tumor by Dr. Zella Richer.  This surgery was initially  uncomplicated.  Over the last couple of weeks, he has had some  intermittent bloating, abdominal swelling and constipation.  He took  some magnesium citrate about 4 days ago. He had a bowel movement and  felt quite a bit better.  However, he now has developed 48 hours of  increasing abdominal distention, nausea and vomiting yesterday and  today.  He has not had a bowel movement in 3 days.  He denies any  specific abdominal pain, just tightness and swelling.  He was seen at  [facility] earlier today and found to have apparent bowel  obstruction as described below and was transferred here for further  care.  He has no history of any other abdominal surgery.   PAST MEDICAL HISTORY:  Surgery is as above.  He has also had a tumor removed from his spine by  Dr. Saintclair Halsted last year.   Medically,  he is followed for adult onset diabetes mellitus,  hypertension, coronary artery disease with a history of MI.  He  apparently had a stent placed at [facility].   CURRENT MEDICATIONS:  1. Actos 30 mg daily.  2. Aspirin 325 mg daily.  3. Caduet 10/20 b.i.d.  4. Janumet 50 mg daily.  5. Lisinopril 20 daily.  6. Omeprazole 20 daily.   ALLERGIES:  None.   SOCIAL HISTORY:  Married.  No cigarettes or alcohol.   FAMILY HISTORY:  Noncontributory.   REVIEW OF SYSTEMS:  GENERAL:  No fever  or chills.  HEENT: No vision,  hearing, swallowing problems.  RESPIRATORY:  Denies shortness of breath,  cough, wheezing, history of lung disease.  CARDIAC:  No recent chest  pain, palpitations, swelling.  ABDOMEN:  GI as above.  GU: No urinary  burning, frequency.   PHYSICAL EXAMINATION:  VITAL SIGNS:  Temperature is 97.4, pulse 116,  respirations 16, blood pressure 165/84.  GENERAL:  Alert African-American male in no acute stress.  SKIN:  Warm and dry.  No rash or infection.  HEENT AND NECK:  No palpable mass or thyromegaly.  Sclerae nonicteric.  Oropharynx clear.  LUNGS:  Clear without wheezing or increased work of breathing.  CARDIAC:  Regular mild tachycardia.  No murmurs.  No edema.  ABDOMEN:  Rather marked abdominal distention.  Abdomen is tympanitic.  There are a few bowel sounds.  No tenderness elicited or guarding.  Trocar sites are well healed.  No trocar site or groin hernias  identified.  EXTREMITIES:  No swelling or deformity.  NEUROLOGIC:  Alert, oriented.  Motor and sensory exams grossly normal.   LABORATORY:  Electrolytes normal.  BUN elevated at 37, creatinine 1.74,  glucose 417.  White count 6.9 without left shift.  Hemoglobin 15.4. LFTs  normal.  Urinalysis negative.   CT scan of the pelvis done at [facility] reviewed here with her  radiologist.  There are distended loops of small bowel down to about the  mid small bowel where there appears to be a transition point intra-  abdominally.  No trocar hernias seen.  No other abnormalities.   ASSESSMENT/PLAN:  Apparent a postoperative small-bowel obstruction.  This would be usual after gastric laparoscopic surgery but certainly  possibly secondary to adhesions.  Patient has previous history of other  abdominal surgery.  He will be admitted. NG tube is being placed.  We  will plan close observation.  He has diabetes with significant  hyperglycemia, and insulin drip is being started.  He has some  dehydration and is  receiving IV fluids.  We will need to watch renal  function closely.      Darene Lamer. Peterson, M.D.  Electronically Signed     BTH/MEDQ  D:  10/27/2007  T:  10/27/2007  Job:  [id number]

## 2011-01-10 NOTE — Discharge Summary (Signed)
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   SECONDARY DIAGNOSES:  1. Diabetes mellitus type 2.  2. Coronary artery disease.  3. Hypercholesterolemia.  4. Hypertension.  5. Mild right chronic renal insufficiency.   PROCEDURE:  Laparoscopic excision of GIS tumor of the stomach, September 27, 2007.   INDICATION:  Mr. Brancato is a 76 year old male who had had some back  pains and had imaging studies including CT scans, and he was  incidentally noted to have a subepithelial gastric mass.  He had an  upper endoscopy done, showing normal line over mucosa.  A endoscopic  ultrasound was performed with the biopsy.  This was consistent with his  GIST tumor, and now he is admitted for excision.   HOSPITAL COURSE:  He was brought to the operating room September 27, 2007,  underwent a wedge excision of the tumor, which he tolerated well.  Postoperatively, he was on sliding scale insulin and had to have  increasing coverage for that.  I kept an NG tube in, and then post-op  day #3, I did an upper GI that demonstrated no leak, the tube was  removed, and liquid diet was started.  He tolerated this well, is having  flatus and BM.  His diet was advanced.  Medicines were started.  He is  able to be discharged to October 01, 2007.   DISPOSITION:  Discharged to home October 01, 2007 in satisfactory  condition.  I told him to avoid overeating and to continue his  medications, like  he was taking preoperatively.  He was given Vicodin  for pain.  He will follow up with me in the office in about 3 weeks or  sooner, if he has any problems.      Odis Hollingshead, M.D.  Electronically Signed     TJR/MEDQ  D:  10/29/2007  T:  10/29/2007  Job:  [id number]   cc:   Milus Banister, MD  fax:  [phone] Dr. Will Bonnet

## 2011-01-10 NOTE — Op Note (Signed)
NAMENOELL, FRESCH NO.:  [id number]   MEDICAL RECORD NO.:  [id number]          PATIENT TYPE:  INP   LOCATION:  2899                         FACILITY:  [place]   PHYSICIAN:  Kary Kos, M.D.        DATE OF BIRTH:  August 02, 1935   DATE OF PROCEDURE:  03/27/2007  DATE OF DISCHARGE:                               OPERATIVE REPORT   PREOPERATIVE DIAGNOSIS:  Intradural mass L3 and left-sided L4-5  herniated nucleus pulposus with left sided L5 radiculopathy.   PROCEDURE:  Decompressive lumbar laminectomy L2-3, L3-4, and L4-5  bilaterally.  Intradural exploration for resection of intradural mass.  Then L4-5 microdiskectomy from the left.   SURGEON:  Kary Kos, M.D.   ASSISTANT:  Eustace Moore, MD   ANESTHESIA:  General tracheal.   FROZEN PATHOLOGY:  Myxopapillary ependymoma.   HISTORY OF PRESENT ILLNESS:  The patient is a very pleasant 76 year old  gentleman who presents with back and left hip and leg pain that radiates  down the top of the foot and big toe with weakness in his great toe on  preoperative exam.  MRI findings showed a homogeneously enhancing mass  at the L3-4 disk space level that was intradural mass at the L3-4 disk  space.  In addition the patient had a large ruptured disk at L4-5 on the  left and due to the patient's clinical exam, with predominantly L5  radiculopathy with an unknown mass in the intradural space patient was  recommended decompressive laminectomy for resection of intradural mass  and also diskectomy for alleviation of his L5 radicular symptoms from  ruptured disk.  The risks and benefits of both those explained to  patient. He understands and agreed to proceed forward.  The patient was brought to the operating room, was induced under general  anesthesia, positioned prone on Wilson frame.  Back prepped and draped  usual fashion.  Preoperative x-ray localized the L4 spinous process and  after infiltration 10 mL lidocaine with epi  midline incision was made  and Bovie electrocautery was used to take down subcutaneous tissue.  Subperiosteal dissection was carried out lamina of L2, 3, 4 and top of  L5.  The self-retaining retractor was placed.  Intraoperative x-ray  confirmed localization appropriate level.  Spinous processes L3 and L4  completely removed well as superior aspect L5 and inferior aspect lamina  of L2.  Central decompression was completed exposing the dura, was noted  be markedly tense.  At this point the operative microscope draped,  brought to field, microscopic examination a Nurolon stitch was placed in  the dura and 11 blade was made a small durotomy in the midline.  This  was then extended cephalocaudally.  The nerve roots were noted to be  displaced dorsally over a ventral mass as the dura was tacked up to  expose the nerve roots and the arachnoid was dissected free using Rhoton  dissectors and sharp dissection.  The arachnoid was split freeing up the  nerve roots.  Then we dissected off a very hyperemic and vascular mass  that appeared  to be coming off the filum.  This was marsupialized around  the nerve roots and further investigated.  It was definitely arising  from filum itself both inferiorly and superiorly.  A couple large blood  vessels coming into the top of the tumor itself.  There were no nerve  roots coming in and around the tumor.  The ones that were adherent to  the capsule was dissected free with sharp microdissection and after  careful inspection confirmed no nerve roots were further adherent to the  capsule the filum was incised approximately 1 cm distal to the tumor and  a centimeter proximal.  Then the tumor was sent to Pathology, frozen  pathology confirmed myxopapillary ependymoma.  At this point the end of  the filum were coagulated.  The nerve roots were copiously irrigated.  The dura was then reapproximated with running Nurolon.  Then attention  was taken to the L4-5 disk  space.  The L5 nerve room was identified and  the L5 pedicle, disk space was identified.  There was a large ruptured  disk still contained with the ligament.  Epidural veins coagulated.  Disk space was incised and radically cleaned out with several large  fragments immediately removed and expressed from the disk space.  At end  the diskectomy was no further stenosis of thecal sac or L5 nerve root.  There was L5 nerve root was widely patent.  Then meticulous hemostasis  was maintained along both sides and all along dura and the diskectomy  with copious irrigation, Gelfoam, Surgifoam in multiple applications.  Then a strip of Duragen was laid over the durotomy and Tisseel was  overlaid on top of this.  Then more Gelfoam was overlaid top of the  Tisseel as well as overlying the disk space level.  The muscle then was  reapproximated with interrupted Vicryl and the remainder of the wound  was closed in layers interrupted Vicryl running locking nylon in the  skin.  At the end of the case all needle counts sponge counts correct.           ______________________________  Kary Kos, M.D.     GC/MEDQ  D:  03/27/2007  T:  03/28/2007  Job:  [id number]

## 2011-01-13 NOTE — Discharge Summary (Signed)
NAMEABDIFATAH, TARWATER NO.:  [id number]   MEDICAL RECORD NO.:  [id number]           PATIENT TYPE:   LOCATION:                                 FACILITY:   PHYSICIAN:  Odis Hollingshead, M.D.DATE OF BIRTH:  05/20/1935   DATE OF ADMISSION:  10/18/2007  DATE OF DISCHARGE:  11/07/2007                               DISCHARGE SUMMARY   PRINCIPAL DISCHARGE DIAGNOSIS:  Small bowel obstruction secondary to an  incarcerated ventral incisional hernia.   SECONDARY DIAGNOSES:  1. Type 2 diabetes.  2. Thrombocytopenia with a question of being related to Lovenox.  3. Mild hypernatremia.   PROCEDURE:  Exploratory laparotomy, lysis of adhesions, and small-bowel  resection in October 30, 2007.   REASON FOR ADMISSION:  This is a 76 year old male who had a laparoscopic  resection of a gastric GIST 1 month prior to admission.  His surgery was  uncomplicated, but he began having progressive swelling, distention,  nausea and vomiting for 48 hours prior to the day of admission.  He had  no bowel movement for 3 days.  He had had some constipation which was  treated with adequate laxatives.  He subsequently presented to the  emergency department and had a CT scan which appeared to show a small-  bowel obstruction, but no obvious reason for this other than adhesions  and he was admitted.   He was admitted to the hospital and decompressed.  He initially had some  chronic renal insufficiency with hydration, this improved.  Some enemas  were tried with minimal results.  He subsequently was taken to the  operating room on October 30, 2007 for failure to progress and was found to  have a Richter's type hernia and epigastric incision where he had suture  disruption plus an incarcerated and partially strangulated hernia.  Postoperatively, he was maintained on intravenous antibiotics and had  some mild volume depletion which was treated with intravenous fluids.  He had a protein calorie  malnutrition and mild pulmonary edema which was  treated with gentle diuresis, he responded to nicely.  He tolerated TNA  well and had sliding scale insulin to help control his type 2 diabetes  as well as Lantus.  He is noted to have some thrombocytopenia and after  stopping Lovenox, this improved.  He had a chest x-ray because of some  fever which demonstrated a possible left lower lobe pneumonia and he was  continued on intravenous antibiotics.  His ileus started to improve and  he started on liquid diet on sixth postoperative day as well as  restarting his antihypertensives.  He continued on intravenous  antibiotics for his left lower lobe pneumonia and his TNA was stopped  after his seventh postop day.  By his eighth postoperative day his  pneumonia clinically resolved.  He is tolerating a diet.  He had good  bowel function and he was able to be discharged.  His staples removed.   DISPOSITION:  Discharged home in satisfactory condition on November 07, 2007.  He was given specific discharge instructions as well as Vicodin  for pain and was told to continue all of his home medications.  He will  follow up with me in the office in approximately 2 weeks.      Odis Hollingshead, M.D.  Electronically Signed     TJR/MEDQ  D:  12/10/2007  T:  12/11/2007  Job:  [id number]

## 2011-01-13 NOTE — Letter (Signed)
June 27, 2007    Bill Peterson  Bill  Peterson, Bill Peterson   RE:  Bill, Peterson  MRN:  [id number]  /  DOB:  Dec 25, 1934   Dr. Vira Agar:   As you know, I performed an endoscopic ultrasound on Bill Peterson 1 week  ago.  The ultrasound showed an approximately 3 cm mass in his gastric  body, subepithelial.  FNA from this proves this to be a gastrointestinal  stromal tumor (GIST).  I spoke with Bill Peterson about these results and  I recommended that he undergo surgical evaluation to consider resection  of this.  He has deferred for now.  I did explain to him that this was a  low grade cancerous type lesion and he will consider surgery, but for  now he just wants to get some time recovering from his recent spinal  procedure.  I offered him a referral to surgeons here at [facility].  He  took their name and number, but did not want me to set up a definite  appointment.  I will try to get him back into my office in the next  couple weeks to discuss referral again.    Sincerely,      Milus Banister, MD  Electronically Signed    DPJ/MedQ  DD: 06/27/2007  DT: 06/27/2007  Job #: [id number]   CC:    Kirk Ruths, M.D.

## 2011-01-13 NOTE — Discharge Summary (Signed)
NAMEMELDRICK, VANDENBUSH NO.:  [id number]   MEDICAL RECORD NO.:  [id number]          PATIENT TYPE:  INP   LOCATION:  3034                         FACILITY:  [facility]   PHYSICIAN:  Kary Kos, M.D.        DATE OF BIRTH:  20-Apr-1935   DATE OF ADMISSION:  03/27/2007  DATE OF DISCHARGE:  04/01/2007                               DISCHARGE SUMMARY   HISTORY OF PRESENT ILLNESS:  The patient is a very pleasant 76 year old  gentleman who was admitted with right lower extremity pain and numbness,  consistent with an L5 nerve root pattern.  MRI scan showed an intradural  mass felt to be consistent with schwannoma or ependymoma, as well as a  ruptured disc at L4-5 on the symptomatic side.  The patient was  recommended laminectomy and diskectomy as well as intradural excision of  intradural mass.   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   PROCEDURE:  Decompressive laminectomy from L2 to L5 for resection of the  ependymoma as well as an L4-5 on the left microdiskectomy.   The patient was admitted.  He went to the operating room and underwent  the aforementioned procedure, which he tolerated. The patient did very  well.  He went from the recovery room to the floor.  On the floor, the  patient was doing very well.  Initially, in the first 24 hours  postoperatively, the patient developed some abdominal pain felt to be  consistent with an ileus.  He was having some shortness of breath.  The  patient was evaluated.  Plain films showed a bowel gas pattern  consistent with ileus.  The patient was afebrile, with stable vital  signs, slightly hypertensive and tachycardic.  Medicine was consulted  and evaluated the patient and helped tune up his blood pressure and his  diabetes.  The patient was maintained on clears and was slowly advanced  to a diet until his constipation resolved and his ileus resolved.  The  patient was subsequently able to be discharged  home on hospital day 5.  Ileus had resolved.  The patient was ambulating and voiding  spontaneously, tolerating a regular diet, and pain was well controlled.  Preoperative pain had resolved.           ______________________________  Kary Kos, M.D.     GC/MEDQ  D:  05/15/2007  T:  05/15/2007  Job:  [id number]

## 2011-01-18 ENCOUNTER — Encounter (INDEPENDENT_AMBULATORY_CARE_PROVIDER_SITE_OTHER): Payer: Self-pay | Admitting: General Surgery

## 2011-05-18 LAB — BASIC METABOLIC PANEL
Calcium: 8.6
Creatinine, Ser: 1.33
GFR calc Af Amer: >60
GFR calc non Af Amer: 53 — ABNORMAL LOW
Sodium: 137

## 2011-05-18 LAB — CBC
HCT: 42.2
Hemoglobin: 13.1
MCV: 91.1
Platelets: 157
RBC: 4.15 — ABNORMAL LOW
WBC: 4.3

## 2011-05-18 LAB — COMPREHENSIVE METABOLIC PANEL
Albumin: 3.8
BUN: 13
CO2: 25
Chloride: 104
Creatinine, Ser: 1.21
GFR calc non Af Amer: 59 — ABNORMAL LOW
Glucose, Bld: 161 — ABNORMAL HIGH
Total Bilirubin: 0.7

## 2011-05-18 LAB — DIFFERENTIAL
Basophils Absolute: 0
Lymphocytes Relative: 24
Neutro Abs: 2.8
Neutrophils Relative %: 65

## 2011-05-18 LAB — PROTIME-INR
INR: 0.9
Prothrombin Time: 12.7

## 2011-05-19 LAB — BASIC METABOLIC PANEL
CO2: 27
GFR calc Af Amer: >60
GFR calc non Af Amer: 56 — ABNORMAL LOW
Glucose, Bld: 184 — ABNORMAL HIGH
Potassium: 4
Sodium: 138

## 2011-05-19 LAB — URINE MICROSCOPIC-ADD ON

## 2011-05-19 LAB — URINALYSIS, ROUTINE W REFLEX MICROSCOPIC
Bilirubin Urine: NEGATIVE
Glucose, UA: NEGATIVE
Specific Gravity, Urine: 1.015
Urobilinogen, UA: 1

## 2011-05-19 LAB — CBC
HCT: 40.6
Hemoglobin: 13.8
MCHC: 33.9
RBC: 4.44
RDW: 13.9

## 2011-05-22 LAB — BASIC METABOLIC PANEL
BUN: 11
BUN: 12
BUN: 4 — ABNORMAL LOW
BUN: 8
CO2: 22
CO2: 26
CO2: 27
CO2: 29
Calcium: 7.7 — ABNORMAL LOW
Calcium: 7.7 — ABNORMAL LOW
Calcium: 8.3 — ABNORMAL LOW
Calcium: 8.5
Calcium: 8.6
Calcium: 8.6
Calcium: 8.8
Chloride: 105
Chloride: 108
Chloride: 114 — ABNORMAL HIGH
Creatinine, Ser: 1.08
Creatinine, Ser: 1.15
Creatinine, Ser: 1.75 — ABNORMAL HIGH
Creatinine, Ser: 2.05 — ABNORMAL HIGH
GFR calc Af Amer: 39 — ABNORMAL LOW
GFR calc Af Amer: 58 — ABNORMAL LOW
GFR calc Af Amer: >60
GFR calc Af Amer: >60
GFR calc non Af Amer: 32 — ABNORMAL LOW
GFR calc non Af Amer: 39 — ABNORMAL LOW
GFR calc non Af Amer: 48 — ABNORMAL LOW
GFR calc non Af Amer: 49 — ABNORMAL LOW
GFR calc non Af Amer: >60
GFR calc non Af Amer: >60
GFR calc non Af Amer: >60
Glucose, Bld: 122 — ABNORMAL HIGH
Glucose, Bld: 140 — ABNORMAL HIGH
Glucose, Bld: 171 — ABNORMAL HIGH
Glucose, Bld: 230 — ABNORMAL HIGH
Glucose, Bld: 240 — ABNORMAL HIGH
Glucose, Bld: 93
Potassium: 3.8
Potassium: 3.9
Potassium: 4.1
Sodium: 137
Sodium: 140
Sodium: 141
Sodium: 143
Sodium: 146 — ABNORMAL HIGH

## 2011-05-22 LAB — COMPREHENSIVE METABOLIC PANEL
ALT: 13
ALT: 23
AST: 30
Albumin: 2 — ABNORMAL LOW
Alkaline Phosphatase: 44
Alkaline Phosphatase: 46
CO2: 26
GFR calc Af Amer: >60
GFR calc non Af Amer: >60
Glucose, Bld: 135 — ABNORMAL HIGH
Glucose, Bld: 246 — ABNORMAL HIGH
Potassium: 3.6
Potassium: 3.7
Sodium: 136
Sodium: 144
Total Bilirubin: 0.6
Total Protein: 5.7 — ABNORMAL LOW

## 2011-05-22 LAB — CBC
HCT: 33 — ABNORMAL LOW
HCT: 33.4 — ABNORMAL LOW
HCT: 35.2 — ABNORMAL LOW
HCT: 41.4
Hemoglobin: 11.3 — ABNORMAL LOW
Hemoglobin: 11.7 — ABNORMAL LOW
Hemoglobin: 11.7 — ABNORMAL LOW
Hemoglobin: 11.9 — ABNORMAL LOW
Hemoglobin: 13.9
Hemoglobin: 14
MCHC: 33.2
MCHC: 33.5
MCHC: 33.9
MCV: 92.7
Platelets: 114 — ABNORMAL LOW
Platelets: 144 — ABNORMAL LOW
Platelets: 173
RBC: 3.64 — ABNORMAL LOW
RBC: 3.78 — ABNORMAL LOW
RBC: 3.81 — ABNORMAL LOW
RBC: 4.49
RBC: 4.54
RDW: 14.2
RDW: 14.3
RDW: 14.5
RDW: 14.5
RDW: 14.8
RDW: 15.1
WBC: 11.6 — ABNORMAL HIGH
WBC: 8.1

## 2011-05-22 LAB — MAGNESIUM: Magnesium: 1.7

## 2011-05-22 LAB — DIFFERENTIAL
Basophils Relative: 0
Basophils Relative: 1
Eosinophils Absolute: 0.1
Eosinophils Absolute: 0.1
Eosinophils Relative: 2
Monocytes Absolute: 0.7
Monocytes Relative: 12
Neutrophils Relative %: 77

## 2011-05-22 LAB — HEPARIN ANTIBODY SCREEN: Heparin Antibody Screen: NEGATIVE

## 2011-05-22 LAB — TRIGLYCERIDES: Triglycerides: 115

## 2011-05-22 LAB — PREALBUMIN: Prealbumin: 4.1 — ABNORMAL LOW

## 2011-06-12 LAB — T3, FREE: T3, Free: 2.5 (ref 2.3–4.2)

## 2011-06-12 LAB — CBC
Hemoglobin: 13.8
MCHC: 33.8
MCHC: 34.2
Platelets: 176
Platelets: 187
RBC: 4.1 — ABNORMAL LOW
RDW: 13.4
RDW: 13.6
WBC: 6

## 2011-06-12 LAB — T4, FREE: Free T4: 1.39

## 2011-06-12 LAB — URINE MICROSCOPIC-ADD ON

## 2011-06-12 LAB — DIFFERENTIAL
Basophils Absolute: 0.1
Basophils Relative: 1
Monocytes Absolute: 0.9 — ABNORMAL HIGH
Neutro Abs: 9.6 — ABNORMAL HIGH

## 2011-06-12 LAB — URINALYSIS, ROUTINE W REFLEX MICROSCOPIC
Leukocytes, UA: NEGATIVE
Protein, ur: NEGATIVE
Urobilinogen, UA: 1

## 2011-06-12 LAB — COMPREHENSIVE METABOLIC PANEL
ALT: 24
BUN: 14
Calcium: 9.1
Glucose, Bld: 266 — ABNORMAL HIGH
Sodium: 141
Total Protein: 6

## 2011-06-12 LAB — BASIC METABOLIC PANEL
BUN: 13
CO2: 26
Calcium: 8.9
Calcium: 9.5
Creatinine, Ser: 1.02
Creatinine, Ser: 1.28
GFR calc Af Amer: >60
GFR calc non Af Amer: 56 — ABNORMAL LOW
Glucose, Bld: 186 — ABNORMAL HIGH

## 2011-06-12 LAB — HEMOGLOBIN A1C
Hgb A1c MFr Bld: 10.1 — ABNORMAL HIGH
Mean Plasma Glucose: 282

## 2011-06-12 LAB — PHOSPHORUS: Phosphorus: 4.4

## 2011-06-13 ENCOUNTER — Encounter (INDEPENDENT_AMBULATORY_CARE_PROVIDER_SITE_OTHER): Payer: Self-pay | Admitting: General Surgery

## 2012-07-08 ENCOUNTER — Ambulatory Visit: Payer: Self-pay | Admitting: Ophthalmology

## 2012-07-22 ENCOUNTER — Ambulatory Visit: Payer: Self-pay | Admitting: Ophthalmology

## 2014-01-18 DIAGNOSIS — I2510 Atherosclerotic heart disease of native coronary artery without angina pectoris: Secondary | ICD-10-CM | POA: Insufficient documentation

## 2014-03-04 DIAGNOSIS — C49A2 Gastrointestinal stromal tumor of stomach: Secondary | ICD-10-CM | POA: Insufficient documentation

## 2014-03-27 ENCOUNTER — Ambulatory Visit: Payer: Self-pay | Admitting: Unknown Physician Specialty

## 2014-03-30 LAB — PATHOLOGY REPORT

## 2014-08-07 DIAGNOSIS — Z8639 Personal history of other endocrine, nutritional and metabolic disease: Secondary | ICD-10-CM | POA: Insufficient documentation

## 2014-12-15 NOTE — Op Note (Signed)
PATIENT NAME:  Bill Peterson, Bill Peterson MR#:  [id number] DATE OF BIRTH:  [birth year]  DATE OF PROCEDURE:  07/22/2012  PREOPERATIVE DIAGNOSIS:  Cataract, right eye.   POSTOPERATIVE DIAGNOSIS:  Cataract, right eye.  PROCEDURE PERFORMED:  Extracapsular cataract extraction using phacoemulsification with placement of an Alcon [id number], 18.0-diopter posterior chamber lens, serial [id number].   SURGEON:  Loura Back. Shanieka Blea, MD  ASSISTANT:  None.  ANESTHESIA:  4% lidocaine and 0.75% Marcaine in a 50/50 mixture with 10 units/mL of Hylenex added, given as a peribulbar.  ANESTHESIOLOGIST:  Elyse Hsu, MD  COMPLICATIONS:  None.  ESTIMATED BLOOD LOSS:  Less than 1 mL.  DESCRIPTION OF PROCEDURE:  The patient was brought to the operating room and given a peribulbar block.  The patient was then prepped and draped in the usual fashion.  The vertical rectus muscles were imbricated using 5-0 silk sutures.  These sutures were then clamped to the sterile drapes as bridle sutures.  A limbal peritomy was performed extending two clock hours and hemostasis was obtained with cautery.  A partial thickness scleral groove was made at the surgical limbus and dissected anteriorly in a lamellar dissection using an Alcon crescent knife.  The anterior chamber was entered superonasally with a Superblade and through the lamellar dissection with a 2.6 mm keratome.  DisCoVisc was used to replace the aqueous and a continuous tear capsulorrhexis was carried out.  Hydrodissection and hydrodelineation were carried out with balanced salt and a 27 gauge canula.  The nucleus was rotated to confirm the effectiveness of the hydrodissection.  Phacoemulsification was carried out using a divide-and-conquer technique.  Total ultrasound time was one minutes and 11 seconds with an average power of 20.5 percent.  CDE 27.83.  Irrigation/aspiration was used to remove the residual cortex.  DisCoVisc was used to inflate the capsule and the internal incision  was enlarged to 3 mm with the crescent knife.  The intraocular lens was folded and inserted into the capsular bag using the AcrySert Delivery System.   Irrigation/aspiration was used to remove the residual DisCoVisc.  Miostat was injected into the anterior chamber through the paracentesis track to inflate the anterior chamber and induce miosis.  The wound was checked for leaks and none were found. The conjunctiva was closed with cautery and the bridle sutures were removed.  Two drops of 0.3% Vigamox were placed on the eye.   An eye shield was placed on the eye.  The patient was discharged to the recovery room in good condition.  ____________________________ Loura Back [name], MD sad:cbb D: 07/22/2012 14:38:53 ET T: 07/22/2012 16:42:19 ET JOB#: [id number]  cc: Remo Lipps A. Nastacia Raybuck, MD, <Dictator> Martie Lee MD ELECTRONICALLY SIGNED 07/29/2012 13:11

## 2015-02-03 ENCOUNTER — Encounter: Payer: Self-pay | Admitting: Gastroenterology

## 2015-04-02 DIAGNOSIS — Z0000 Encounter for general adult medical examination without abnormal findings: Secondary | ICD-10-CM | POA: Insufficient documentation

## 2017-08-08 ENCOUNTER — Ambulatory Visit: Payer: Self-pay | Admitting: Urology

## 2017-08-15 ENCOUNTER — Ambulatory Visit (INDEPENDENT_AMBULATORY_CARE_PROVIDER_SITE_OTHER): Payer: Medicare Other | Admitting: Urology

## 2017-08-15 ENCOUNTER — Encounter: Payer: Self-pay | Admitting: Emergency Medicine

## 2017-08-15 ENCOUNTER — Encounter: Payer: Self-pay | Admitting: Urology

## 2017-08-15 ENCOUNTER — Inpatient Hospital Stay
Admission: EM | Admit: 2017-08-15 | Discharge: 2017-08-19 | DRG: 291 | Disposition: A | Discharge status: Home / Self Care | Payer: Medicare Other | Attending: Internal Medicine | Admitting: Internal Medicine

## 2017-08-15 ENCOUNTER — Other Ambulatory Visit: Payer: Self-pay

## 2017-08-15 ENCOUNTER — Emergency Department: Payer: Medicare Other

## 2017-08-15 VITALS — BP 146/71 | HR 97 | Ht 66.0 in | Wt 200.2 lb

## 2017-08-15 DIAGNOSIS — D72829 Elevated white blood cell count, unspecified: Secondary | ICD-10-CM | POA: Diagnosis present

## 2017-08-15 DIAGNOSIS — I252 Old myocardial infarction: Secondary | ICD-10-CM

## 2017-08-15 DIAGNOSIS — R011 Cardiac murmur, unspecified: Secondary | ICD-10-CM | POA: Diagnosis present

## 2017-08-15 DIAGNOSIS — N183 Chronic kidney disease, stage 3 unspecified: Secondary | ICD-10-CM

## 2017-08-15 DIAGNOSIS — Z8249 Family history of ischemic heart disease and other diseases of the circulatory system: Secondary | ICD-10-CM

## 2017-08-15 DIAGNOSIS — J45909 Unspecified asthma, uncomplicated: Secondary | ICD-10-CM | POA: Diagnosis present

## 2017-08-15 DIAGNOSIS — Z955 Presence of coronary angioplasty implant and graft: Secondary | ICD-10-CM | POA: Diagnosis not present

## 2017-08-15 DIAGNOSIS — I130 Hypertensive heart and chronic kidney disease with heart failure and stage 1 through stage 4 chronic kidney disease, or unspecified chronic kidney disease: Principal | ICD-10-CM | POA: Diagnosis present

## 2017-08-15 DIAGNOSIS — I350 Nonrheumatic aortic (valve) stenosis: Secondary | ICD-10-CM | POA: Diagnosis present

## 2017-08-15 DIAGNOSIS — N179 Acute kidney failure, unspecified: Secondary | ICD-10-CM

## 2017-08-15 DIAGNOSIS — E785 Hyperlipidemia, unspecified: Secondary | ICD-10-CM | POA: Diagnosis present

## 2017-08-15 DIAGNOSIS — Z833 Family history of diabetes mellitus: Secondary | ICD-10-CM | POA: Diagnosis not present

## 2017-08-15 DIAGNOSIS — I5031 Acute diastolic (congestive) heart failure: Secondary | ICD-10-CM | POA: Diagnosis present

## 2017-08-15 DIAGNOSIS — N401 Enlarged prostate with lower urinary tract symptoms: Secondary | ICD-10-CM | POA: Diagnosis present

## 2017-08-15 DIAGNOSIS — Z7984 Long term (current) use of oral hypoglycemic drugs: Secondary | ICD-10-CM | POA: Diagnosis not present

## 2017-08-15 DIAGNOSIS — R338 Other retention of urine: Secondary | ICD-10-CM | POA: Diagnosis present

## 2017-08-15 DIAGNOSIS — I248 Other forms of acute ischemic heart disease: Secondary | ICD-10-CM | POA: Diagnosis present

## 2017-08-15 DIAGNOSIS — J811 Chronic pulmonary edema: Secondary | ICD-10-CM | POA: Diagnosis present

## 2017-08-15 DIAGNOSIS — D631 Anemia in chronic kidney disease: Secondary | ICD-10-CM | POA: Diagnosis present

## 2017-08-15 DIAGNOSIS — R0902 Hypoxemia: Secondary | ICD-10-CM

## 2017-08-15 DIAGNOSIS — I509 Heart failure, unspecified: Secondary | ICD-10-CM

## 2017-08-15 DIAGNOSIS — E1122 Type 2 diabetes mellitus with diabetic chronic kidney disease: Secondary | ICD-10-CM | POA: Diagnosis present

## 2017-08-15 DIAGNOSIS — I2510 Atherosclerotic heart disease of native coronary artery without angina pectoris: Secondary | ICD-10-CM | POA: Diagnosis present

## 2017-08-15 DIAGNOSIS — Z888 Allergy status to other drugs, medicaments and biological substances status: Secondary | ICD-10-CM

## 2017-08-15 DIAGNOSIS — Z79899 Other long term (current) drug therapy: Secondary | ICD-10-CM

## 2017-08-15 DIAGNOSIS — J9601 Acute respiratory failure with hypoxia: Secondary | ICD-10-CM | POA: Diagnosis present

## 2017-08-15 DIAGNOSIS — E669 Obesity, unspecified: Secondary | ICD-10-CM | POA: Diagnosis present

## 2017-08-15 DIAGNOSIS — J810 Acute pulmonary edema: Secondary | ICD-10-CM | POA: Diagnosis present

## 2017-08-15 DIAGNOSIS — Z6832 Body mass index (BMI) 32.0-32.9, adult: Secondary | ICD-10-CM | POA: Diagnosis not present

## 2017-08-15 DIAGNOSIS — R972 Elevated prostate specific antigen [PSA]: Secondary | ICD-10-CM | POA: Diagnosis not present

## 2017-08-15 DIAGNOSIS — R319 Hematuria, unspecified: Secondary | ICD-10-CM | POA: Diagnosis present

## 2017-08-15 DIAGNOSIS — R531 Weakness: Secondary | ICD-10-CM | POA: Diagnosis present

## 2017-08-15 DIAGNOSIS — Z7982 Long term (current) use of aspirin: Secondary | ICD-10-CM | POA: Diagnosis not present

## 2017-08-15 LAB — BASIC METABOLIC PANEL
ANION GAP: 10 (ref 5–15)
BUN: 22 mg/dL — ABNORMAL HIGH (ref 6–20)
CHLORIDE: 102 mmol/L (ref 101–111)
CO2: 27 mmol/L (ref 22–32)
Calcium: 9.2 mg/dL (ref 8.9–10.3)
Creatinine, Ser: 1.84 mg/dL — ABNORMAL HIGH (ref 0.61–1.24)
GFR calc Af Amer: 38 mL/min — ABNORMAL LOW (ref >60)
GFR calc non Af Amer: 33 mL/min — ABNORMAL LOW (ref >60)
GLUCOSE: 172 mg/dL — AB (ref 65–99)
POTASSIUM: 3.7 mmol/L (ref 3.5–5.1)
Sodium: 139 mmol/L (ref 135–145)

## 2017-08-15 LAB — CBC WITH DIFFERENTIAL/PLATELET
Basophils Absolute: 0 10*3/uL (ref 0–0.1)
Basophils Relative: 0 %
Eosinophils Absolute: 0.1 10*3/uL (ref 0–0.7)
Eosinophils Relative: 1 %
HEMATOCRIT: 39.8 % — AB (ref 40.0–52.0)
Hemoglobin: 13.1 g/dL (ref 13.0–18.0)
Lymphocytes Relative: 41 %
Lymphs Abs: 4.6 10*3/uL — ABNORMAL HIGH (ref 1.0–3.6)
MCH: 31.1 pg (ref 26.0–34.0)
MCHC: 32.9 g/dL (ref 32.0–36.0)
MCV: 94.5 fL (ref 80.0–100.0)
MONO ABS: 1.2 10*3/uL — AB (ref 0.2–1.0)
MONOS PCT: 11 %
NEUTROS ABS: 5.3 10*3/uL (ref 1.4–6.5)
Neutrophils Relative %: 47 %
Platelets: 321 10*3/uL (ref 150–440)
RBC: 4.21 MIL/uL — ABNORMAL LOW (ref 4.40–5.90)
RDW: 15.3 % — ABNORMAL HIGH (ref 11.5–14.5)
WBC: 11.2 10*3/uL — ABNORMAL HIGH (ref 3.8–10.6)

## 2017-08-15 LAB — MRSA PCR SCREENING: MRSA BY PCR: NEGATIVE

## 2017-08-15 LAB — TROPONIN I
Troponin I: <0.03 ng/mL (ref <0.03)
Troponin I: 0.04 ng/mL (ref <0.03)

## 2017-08-15 LAB — GLUCOSE, CAPILLARY
Glucose-Capillary: 160 mg/dL — ABNORMAL HIGH (ref 65–99)
Glucose-Capillary: 154 mg/dL — ABNORMAL HIGH (ref 65–99)

## 2017-08-15 LAB — BRAIN NATRIURETIC PEPTIDE: B Natriuretic Peptide: 229 pg/mL — ABNORMAL HIGH (ref 0.0–100.0)

## 2017-08-15 MED ORDER — GLIMEPIRIDE 1 MG PO TABS
1.0000 mg | ORAL_TABLET | Freq: Every day | ORAL | Status: DC
  Filled 2017-08-15: qty 1

## 2017-08-15 MED ORDER — METFORMIN HCL 500 MG PO TABS: 1000.0000 mg | ORAL_TABLET | Freq: Two times a day (BID) | ORAL | Status: DC

## 2017-08-15 MED ORDER — FUROSEMIDE 10 MG/ML IJ SOLN
40.0000 mg | Freq: Two times a day (BID) | INTRAMUSCULAR | Status: DC
  Administered 2017-08-15 – 2017-08-17 (×5): 40 mg via INTRAVENOUS
  Filled 2017-08-15 (×5): qty 4

## 2017-08-15 MED ORDER — ASPIRIN 325 MG PO TABS
325.0000 mg | ORAL_TABLET | Freq: Every day | ORAL | Status: DC
  Administered 2017-08-16 – 2017-08-19 (×4): 325 mg via ORAL
  Filled 2017-08-15 (×4): qty 1

## 2017-08-15 MED ORDER — INSULIN ASPART 100 UNIT/ML ~~LOC~~ SOLN
0.0000 [IU] | Freq: Three times a day (TID) | SUBCUTANEOUS | Status: DC
Start: 2017-08-16 — End: 2017-08-19
  Administered 2017-08-16: 2 [IU] via SUBCUTANEOUS
  Administered 2017-08-16: 5 [IU] via SUBCUTANEOUS
  Administered 2017-08-16 – 2017-08-17 (×2): 1 [IU] via SUBCUTANEOUS
  Administered 2017-08-17: 3 [IU] via SUBCUTANEOUS
  Administered 2017-08-18 (×2): 2 [IU] via SUBCUTANEOUS
  Filled 2017-08-15 (×7): qty 1

## 2017-08-15 MED ORDER — ATORVASTATIN CALCIUM 20 MG PO TABS
20.0000 mg | ORAL_TABLET | Freq: Every day | ORAL | Status: DC
  Administered 2017-08-15 – 2017-08-19 (×5): 20 mg via ORAL
  Filled 2017-08-15 (×6): qty 1

## 2017-08-15 MED ORDER — NITROGLYCERIN IN D5W 200-5 MCG/ML-% IV SOLN
0.0000 ug/min | INTRAVENOUS | Status: DC
  Administered 2017-08-15: 40 ug/min via INTRAVENOUS
  Filled 2017-08-15: qty 250

## 2017-08-15 MED ORDER — ONDANSETRON HCL 4 MG PO TABS: 4.0000 mg | ORAL_TABLET | Freq: Four times a day (QID) | ORAL | Status: DC | PRN

## 2017-08-15 MED ORDER — MULTIVITAMINS PO CAPS: 1.0000 | ORAL_CAPSULE | Freq: Every day | ORAL | Status: DC

## 2017-08-15 MED ORDER — ONDANSETRON HCL 4 MG/2ML IJ SOLN: 4.0000 mg | Freq: Four times a day (QID) | INTRAMUSCULAR | Status: DC | PRN

## 2017-08-15 MED ORDER — METFORMIN HCL 500 MG PO TABS: 1000.0000 mg | ORAL_TABLET | Freq: Every day | ORAL | Status: DC

## 2017-08-15 MED ORDER — IPRATROPIUM-ALBUTEROL 0.5-2.5 (3) MG/3ML IN SOLN
3.0000 mL | RESPIRATORY_TRACT | Status: DC
  Administered 2017-08-16 (×3): 3 mL via RESPIRATORY_TRACT
  Filled 2017-08-15 (×3): qty 3

## 2017-08-15 MED ORDER — HYDRALAZINE HCL 20 MG/ML IJ SOLN: 10.0000 mg | Freq: Four times a day (QID) | INTRAMUSCULAR | Status: DC | PRN

## 2017-08-15 MED ORDER — INSULIN ASPART 100 UNIT/ML ~~LOC~~ SOLN
0.0000 [IU] | Freq: Every day | SUBCUTANEOUS | Status: DC
  Administered 2017-08-18: 2 [IU] via SUBCUTANEOUS
  Filled 2017-08-15 (×2): qty 1

## 2017-08-15 MED ORDER — ADULT MULTIVITAMIN W/MINERALS CH
1.0000 | ORAL_TABLET | Freq: Every day | ORAL | Status: DC
  Administered 2017-08-16 – 2017-08-19 (×4): 1 via ORAL
  Filled 2017-08-15 (×4): qty 1

## 2017-08-15 MED ORDER — ACETAMINOPHEN 325 MG PO TABS: 650.0000 mg | ORAL_TABLET | Freq: Four times a day (QID) | ORAL | Status: DC | PRN

## 2017-08-15 MED ORDER — ACETAMINOPHEN 650 MG RE SUPP: 650.0000 mg | Freq: Four times a day (QID) | RECTAL | Status: DC | PRN

## 2017-08-15 MED ORDER — FUROSEMIDE 10 MG/ML IJ SOLN
60.0000 mg | Freq: Once | INTRAMUSCULAR | Status: AC
  Administered 2017-08-15: 60 mg via INTRAVENOUS
  Filled 2017-08-15: qty 8

## 2017-08-15 MED ORDER — LISINOPRIL 20 MG PO TABS
20.0000 mg | ORAL_TABLET | Freq: Every day | ORAL | Status: DC
  Administered 2017-08-16 – 2017-08-19 (×4): 20 mg via ORAL
  Filled 2017-08-15 (×4): qty 1

## 2017-08-15 MED ORDER — AMLODIPINE BESYLATE 10 MG PO TABS
10.0000 mg | ORAL_TABLET | Freq: Every day | ORAL | Status: DC
  Administered 2017-08-15 – 2017-08-19 (×5): 10 mg via ORAL
  Filled 2017-08-15 (×5): qty 1

## 2017-08-15 NOTE — ED Triage Notes (Signed)
Pt arrived via POV, initial SpO2 38% on 4L via [place]. Pt with noted dyspnea on arrival. Pt wife reports increasing SOB for approximately one week.

## 2017-08-15 NOTE — ED Notes (Signed)
BiPap was removed by admitting provider. Called to pt room for shortness of breath and dyspnea. Dr. Clearnce Hasten at bedside. Pt placed back on BiPap. RT at bedside.

## 2017-08-15 NOTE — Progress Notes (Signed)
Patient's oxygen saturation is 100% on 80% FIO2 at this time, decreased to 50%.

## 2017-08-15 NOTE — H&P (Signed)
[facility] NAME: Bill Peterson    MR#:  [id number]  DATE OF BIRTH:  1934-11-24  DATE OF ADMISSION:  08/15/2017  PRIMARY CARE PHYSICIAN: Kirk Ruths, MD   REQUESTING/REFERRING PHYSICIAN: Dr. Larae Grooms  CHIEF COMPLAINT:   Chief Complaint  Patient presents with  . Respiratory Distress    HISTORY OF PRESENT ILLNESS:  Bill Peterson  is a 82 y.o. male with a known history of hypertension, non-insulin-dependent diabetes mellitus, hyperlipidemia brought in from echo lab secondary to worsening shortness of breath. Patient is currently on BiPAP and most of the history is obtained from his wife at bedside. Or the last couple of weeks he's been slowly having worsening shortness of breath and cough. He saw his PCP and had a chest x-ray done which showed congestion and was started on oral Lasix daily. He had an echocardiogram scheduled for today. As soon as he laid flat to get his echo done, he had increased shortness of breath, hypoxia saturations in the 70s and was brought to the emergency room. Also complains of cough with whitish frothy phlegm. Not using home oxygen at baseline. Currently on BiPAP. Chest x-ray with diffuse pulmonary edema  PAST MEDICAL HISTORY:   Past Medical History:  Diagnosis Date  . Diabetes mellitus without complication ([place])   . Hyperlipidemia   . Hypertension     PAST SURGICAL HISTORY:   Past Surgical History:  Procedure Laterality Date  . BACK SURGERY    . CARDIAC SURGERY    . STOMACH SURGERY      SOCIAL HISTORY:   Social History   Tobacco Use  . Smoking status: Never Smoker  . Smokeless tobacco: Never Used  Substance Use Topics  . Alcohol use: No    FAMILY HISTORY:   Family History  Problem Relation Age of Onset  . Heart disease Mother   . Diabetes Father   . Prostate cancer Neg Hx   . Kidney cancer Neg Hx   . Bladder Cancer Neg Hx     DRUG ALLERGIES:   Allergies    Allergen Reactions  . Heparin     REACTION: Unspecified  . Repaglinide Other (See Comments)    Increased appetite Increased appetite Increased appetite     REVIEW OF SYSTEMS:   Review of Systems  Constitutional: Negative for chills, fever, malaise/fatigue and weight loss.  HENT: Negative for ear discharge, ear pain, hearing loss, nosebleeds and tinnitus.   Eyes: Negative for blurred vision, double vision and photophobia.  Respiratory: Positive for cough, shortness of breath and wheezing. Negative for hemoptysis.   Cardiovascular: Positive for leg swelling. Negative for chest pain, palpitations and orthopnea.  Gastrointestinal: Negative for abdominal pain, constipation, diarrhea, heartburn, melena, nausea and vomiting.  Genitourinary: Negative for dysuria, frequency, hematuria and urgency.  Musculoskeletal: Negative for back pain, myalgias and neck pain.  Skin: Negative for rash.  Neurological: Negative for dizziness, tingling, tremors, sensory change, speech change, focal weakness and headaches.  Endo/Heme/Allergies: Does not bruise/bleed easily.  Psychiatric/Behavioral: Negative for depression.    MEDICATIONS AT HOME:   Prior to Admission medications   Medication Sig Start Date End Date Taking? Authorizing Provider  amLODipine (NORVASC) 10 MG tablet Take 10 mg by mouth daily.   Yes [provider]  Apple Cider Vinegar 300 MG TABS Take by mouth.   Yes [provider]  aspirin 325 MG tablet Take 325 mg by mouth daily.     Yes  [provider]  atorvastatin (LIPITOR) 20 MG tablet Take 20 mg by mouth daily.   Yes [provider]  furosemide (LASIX) 20 MG tablet Take 20 mg by mouth.   Yes [provider]  glimepiride (AMARYL) 1 MG tablet Take 1 mg by mouth daily with breakfast.   Yes [provider]  lisinopril (PRINIVIL,ZESTRIL) 20 MG tablet Take 20 mg by mouth daily.   Yes [provider]   lisinopril-hydrochlorothiazide (PRINZIDE,ZESTORETIC) 20-12.5 MG tablet Take 1 tablet by mouth daily.   Yes [provider]  metFORMIN (GLUCOPHAGE) 1000 MG tablet Take 1,000 mg by mouth 2 (two) times daily with a meal.   Yes [provider]  Multiple Vitamin (MULTIVITAMIN) capsule Take 1 capsule by mouth daily.     Yes [provider]  pioglitazone (ACTOS) 30 MG tablet Take 30 mg by mouth daily.   Yes [provider]  Vitamins C E (VITAMIN C & E COMBINATION PO) Take by mouth.     Yes [provider]      VITAL SIGNS:  Blood pressure 117/66, pulse (!) 102, resp. rate (!) 23, SpO2 97 %.  PHYSICAL EXAMINATION:   Physical Exam  GENERAL:  82 y.o.-year-old patient sitting in bed and appears to be in acute respiratory distress.Marland Kitchen  EYES: Pupils equal, round, reactive to light and accommodation. No scleral icterus. Extraocular muscles intact.  HEENT: Head atraumatic, normocephalic. Oropharynx and nasopharynx clear.  NECK:  Supple, no jugular venous distention. No thyroid enlargement, no tenderness.  LUNGS: Coarse breath sounds bilaterally, worsened crackles at the bases. Using accessory muscles to breathe with minimal exertion. No wheezing heard.  CARDIOVASCULAR: S1, S2 normal. No murmurs, rubs, or gallops.  ABDOMEN: Soft, nontender, nondistended. Bowel sounds present. No organomegaly or mass.  EXTREMITIES: No pedal edema, cyanosis, or clubbing.  NEUROLOGIC: Cranial nerves II through XII are intact. Muscle strength 5/5 in all extremities. Sensation intact. Gait not checked.  PSYCHIATRIC: The patient is alert and oriented x 3.  SKIN: No obvious rash, lesion, or ulcer.   LABORATORY PANEL:   CBC Recent Labs  Lab 08/15/17 1620  WBC 11.2*  HGB 13.1  HCT 39.8*  PLT 321   ------------------------------------------------------------------------------------------------------------------  Chemistries  Recent Labs  Lab 08/15/17 1620  NA 139  K 3.7   CL 102  CO2 27  GLUCOSE 172*  BUN 22*  CREATININE 1.84*  CALCIUM 9.2   ------------------------------------------------------------------------------------------------------------------  Cardiac Enzymes Recent Labs  Lab 08/15/17 1620  TROPONINI <0.03   ------------------------------------------------------------------------------------------------------------------  RADIOLOGY:  Dg Chest 1 View  Result Date: 08/15/2017 CLINICAL DATA:  Severe shortness of breath, suspect pulmonary edema. History of previous MI, diabetes. EXAM: CHEST 1 VIEW COMPARISON:  Chest x-ray of November 03, 2017 FINDINGS: The lungs are well-expanded. The interstitial markings are diffusely increased. The pulmonary vascularity is engorged and indistinct. The cardiac silhouette is top-normal in size but not greatly changed. There is no pleural effusion. There is multilevel degenerative disc disease of the thoracic spine. IMPRESSION: Diffusely increased pulmonary interstitial markings likely reflects pulmonary edema. Widespread interstitial pneumonia could produce similar findings. Electronically Signed   By: David  Martinique M.D.   On: 08/15/2017 16:41    EKG:   Orders placed or performed during the hospital encounter of 10/26/07  . EKG 12-Lead  . EKG 12-Lead    IMPRESSION AND PLAN:   Bill Peterson  is a 82 y.o. male with a known history of hypertension, non-insulin-dependent diabetes mellitus, hyperlipidemia brought in from echo lab secondary to  worsening shortness of breath.  1. Acute hypoxic respiratory failure-currently on BiPAP -Secondary to acute pulmonary edema -We will admit to ICU. Discussed with Dr. Jamal Collin -On FiO2 50%, IV Lasix twice a day - Wean oxygen as tolerated. -Follow up echocardiogram -Cardiology consulted  2. CAD-status post stent in 1996. Stable. Continue medications-on aspirin  3.diabetes mellitus-on glimepiride and metformin. Sliding scale insulin added -hold Actos due to his congestive  heart failure  4. Hypertension-on lisinopril and Norvasc. Also on IV Lasix  5. DVT prophylaxis-Lovenox    All the records are reviewed and case discussed with ED provider. Management plans discussed with the patient, family and they are in agreement.  CODE STATUS: Full code  TOTAL CRITICAL CARE TIME SPENT IN TAKING CARE OF THIS PATIENT: 55 minutes.    Bill Peterson M.D on 08/15/2017 at 6:00 PM  Between 7am to 6pm - Pager - [phone]  After 6pm go to [email or web address] - password [email or web address] [facility]  Office  [phone]  CC: Primary care physician; Kirk Ruths, MD

## 2017-08-15 NOTE — ED Notes (Signed)
Called report to ICU Dell RN, call to RT to transport pt

## 2017-08-15 NOTE — Progress Notes (Signed)
08/15/2017 12:17 PM   Bill Peterson 03/25/1935 [id number]  Referring provider: No referring provider defined for this encounter.  Chief Complaint  Patient presents with  . Elevated PSA    HPI: 82 year old male presents for follow-up of an elevated PSA.  A prostate biopsy was performed in March 2007 for a PSA of 4.2 with benign pathology.  He has no bothersome lower urinary tract symptoms.  Denies dysuria or gross hematuria.  He has no flank, abdominal, pelvic or scrotal pain.  His PSA has slowly risen over the past several years and he has elected active surveillance.  His last PSA December 2017 was 5.92.  It has ranged in the low 5-mid 6 range.   PMH: Past Medical History:  Diagnosis Date  . Diabetes mellitus without complication ([place])   . Hyperlipidemia   . Hypertension     Surgical History: Past Surgical History:  Procedure Laterality Date  . BACK SURGERY    . CARDIAC SURGERY    . STOMACH SURGERY      Home Medications:  Allergies as of 08/15/2017      Reactions   Heparin    REACTION: Unspecified   Repaglinide Other (See Comments)   Increased appetite Increased appetite Increased appetite      Medication List        Accurate as of 08/15/17 12:17 PM. Always use your most recent med list.          amLODipine 10 MG tablet Commonly known as:  NORVASC Take 10 mg by mouth daily.   Apple Cider Vinegar 300 MG Tabs Take by mouth.   aspirin 325 MG tablet Take 325 mg by mouth daily.   atorvastatin 20 MG tablet Commonly known as:  LIPITOR Take 20 mg by mouth daily.   furosemide 20 MG tablet Commonly known as:  LASIX Take 20 mg by mouth.   glimepiride 1 MG tablet Commonly known as:  AMARYL Take 1 mg by mouth daily with breakfast.   lisinopril 20 MG tablet Commonly known as:  PRINIVIL,ZESTRIL Take 20 mg by mouth daily.   lisinopril-hydrochlorothiazide 20-12.5 MG tablet Commonly known as:  PRINZIDE,ZESTORETIC Take 1 tablet by mouth daily.     metFORMIN 1000 MG tablet Commonly known as:  GLUCOPHAGE Take 1,000 mg by mouth 2 (two) times daily with a meal.   multivitamin capsule Take 1 capsule by mouth daily.   pioglitazone 30 MG tablet Commonly known as:  ACTOS Take 30 mg by mouth daily.   VITAMIN C & E COMBINATION PO Take by mouth.       Allergies:  Allergies  Allergen Reactions  . Heparin     REACTION: Unspecified  . Repaglinide Other (See Comments)    Increased appetite Increased appetite Increased appetite     Family History: Family History  Problem Relation Age of Onset  . Heart disease Mother   . Diabetes Father   . Prostate cancer Neg Hx   . Kidney cancer Neg Hx   . Bladder Cancer Neg Hx     Social History:  reports that  has never smoked. he has never used smokeless tobacco. He reports that he does not drink alcohol or use drugs.  ROS: UROLOGY Frequent Urination?: Yes Hard to postpone urination?: No Burning/pain with urination?: No Get up at night to urinate?: Yes Leakage of urine?: No Urine stream starts and stops?: No Trouble starting stream?: No Do you have to strain to urinate?: No Blood in urine?: No Urinary tract infection?:  No Sexually transmitted disease?: No Injury to kidneys or bladder?: No Painful intercourse?: No Weak stream?: No Erection problems?: No Penile pain?: No  Gastrointestinal Nausea?: No Vomiting?: No Indigestion/heartburn?: No Diarrhea?: No Constipation?: No  Constitutional Fever: No Night sweats?: No Weight loss?: No Fatigue?: No  Skin Skin rash/lesions?: No Itching?: No  Eyes Blurred vision?: Yes Double vision?: No  Ears/Nose/Throat Sore throat?: No Sinus problems?: No  Hematologic/Lymphatic Swollen glands?: No Easy bruising?: No  Cardiovascular Leg swelling?: Yes Chest pain?: No  Respiratory Cough?: No Shortness of breath?: Yes  Endocrine Excessive thirst?: No  Musculoskeletal Back pain?: No Joint pain?:  Yes  Neurological Headaches?: No Dizziness?: No  Psychologic Depression?: No Anxiety?: No  Physical Exam: BP (!) 146/71 (BP Location: Right Arm, Patient Position: Sitting, Cuff Size: Normal)   Pulse 97   Ht 5\' 6"  (1.676 m)   Wt 200 lb 3.2 oz (90.8 kg)   BMI 32.31 kg/m   Constitutional:  Alert and oriented, No acute distress. HEENT: [place] AT, moist mucus membranes.  Trachea midline, no masses. Cardiovascular: No clubbing, cyanosis, or edema. Respiratory: Normal respiratory effort, no increased work of breathing. GI: Abdomen is soft, nontender, nondistended, no abdominal masses GU: No CVA tenderness.  Prostate 50 g, smooth without nodules. Skin: No rashes, bruises or suspicious lesions. Lymph: No cervical or inguinal adenopathy. Neurologic: Grossly intact, no focal deficits, moving all 4 extremities. Psychiatric: Normal mood and affect.  Laboratory Data: Lab Results  Component Value Date   WBC 6.3 11/04/2007   HGB 11.9 (L) 11/04/2007   HCT 34.9 (L) 11/04/2007   MCV 91.1 11/04/2007   PLT 222 11/04/2007    Lab Results  Component Value Date   CREATININE 1.08 11/06/2007    Lab Results  Component Value Date   HGBA1C (H) 03/29/2007    10.1 (NOTE)   The ADA recommends the following therapeutic goals for glycemic   control related to Hgb A1C measurement:   Goal of Therapy:   < 7.0% Hgb A1C   Action Suggested:  > 8.0% Hgb A1C   Ref:  Diabetes Care, 22, Suppl. 1, 1999      Assessment & Plan:    1. Elevated PSA He has elected to continue surveillance.  PSA was drawn today and if stable he will continue annual follow-up.  - PSA   Return in about 1 year (around 08/15/2018) for Recheck, PSA.  Abbie Sons, [facility] [address], [place], [place] [address] [phone]
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
## 2017-08-15 NOTE — ED Notes (Signed)
Receiving RN unable to take report at this time. This RN asked to call back in ten minutes.

## 2017-08-15 NOTE — Consult Note (Signed)
Name: Bill Peterson MRN: [id number] DOB: [birth year]    ADMISSION DATE:  08/15/2017  CONSULTATION DATE:  08/15/17  REFERRING MD :  Dr. Tressia Miners  CHIEF COMPLAINT:  Respiratory Distress  BRIEF PATIENT DESCRIPTION:82 -year-old male with acute respiratory failure secondary to pulmonary edema, requiring BiPAP  SIGNIFICANT EVENTS  12/19 Patient admitted to the SDU  With shortness of breath requiring BiPAP  STUDIES:  None   HISTORY OF PRESENT ILLNESS:  Bill Peterson is a 82 year old male with known history of hypertension, diabetes mellitus and hyperlipidemia.  On 12/19 patient was brought from echo lab secondary to worsening shortness of breath.  Patient had shortness of breath and cough for last couple of weeks.  He saw his PCP and was started on Lasix.  PCP recommended echo however the patient was not able to lay flat on the table and became hypoxic with sats down to 70s.  Patient was brought to the ED and was placed on BiPAP.  Chest x-ray was concerning for pulmonary edema.  Patient was sent to the stepdown unit for closer monitoring on BiPAP.  PAST MEDICAL HISTORY :   has a past medical history of Diabetes mellitus without complication ([place]), Hyperlipidemia, and Hypertension.  has a past surgical history that includes Stomach surgery; Back surgery; and Cardiac surgery. Prior to Admission medications   Medication Sig Start Date End Date Taking? Authorizing Provider  amLODipine (NORVASC) 10 MG tablet Take 10 mg by mouth daily.   Yes [provider]  Apple Cider Vinegar 300 MG TABS Take by mouth.   Yes [provider]  aspirin 325 MG tablet Take 325 mg by mouth daily.     Yes [provider]  atorvastatin (LIPITOR) 20 MG tablet Take 20 mg by mouth daily.   Yes [provider]  furosemide (LASIX) 20 MG tablet Take 20 mg by mouth.   Yes [provider]  glimepiride (AMARYL) 1 MG tablet Take 1 mg by mouth daily with breakfast.   Yes [provider]  lisinopril (PRINIVIL,ZESTRIL) 20 MG tablet Take 20 mg by mouth daily.   Yes [provider]  lisinopril-hydrochlorothiazide (PRINZIDE,ZESTORETIC) 20-12.5 MG tablet Take 1 tablet by mouth daily.   Yes [provider]  metFORMIN (GLUCOPHAGE) 1000 MG tablet Take 1,000 mg by mouth 2 (two) times daily with a meal.   Yes [provider]  Multiple Vitamin (MULTIVITAMIN) capsule Take 1 capsule by mouth daily.     Yes [provider]  pioglitazone (ACTOS) 30 MG tablet Take 30 mg by mouth daily.   Yes [provider]  Vitamins C E (VITAMIN C & E COMBINATION PO) Take by mouth.     Yes [provider]   Allergies  Allergen Reactions  . Heparin     REACTION: Unspecified  . Repaglinide Other (See Comments)    Increased appetite Increased appetite Increased appetite     FAMILY HISTORY:  family history includes Diabetes in his father; Heart disease in his mother. SOCIAL HISTORY:  reports that  has never smoked. he has never used smokeless tobacco. He reports that he does not drink alcohol or use drugs.   SUBJECTIVE: Patient is on BiPAP, in no distress  VITAL SIGNS: Pulse Rate:  [90-120] 90 (12/19 1830) Resp:  [18-38] 19 (12/19 1830) BP: (105-193)/(65-129) 120/65 (12/19 1830) SpO2:  [91 %-100 %] 100 % (12/19 1830) Weight:  [200 lb 3.2 oz (90.8 kg)] 200 lb 3.2 oz (90.8 kg) (12/19 1045)  PHYSICAL EXAMINATION:  General: 82 year old male, on BiPAP, in no acute distress Neuro:  Awake,alert and oriented HEENT:  AT,[place],NO jvd Cardiovascular: s1s2,regular, no m/r/g Lungs:  Diminished bibasilar, no wheezes,crackles and rhonchi Abdomen: firm, NT Musculoskeletal:  +3 edema, no cyanosis or clubbing noted Skin:  Warm,dry and intact  Recent Labs  Lab 08/15/17 1620  NA 139  K 3.7  CL 102  CO2 27  BUN 22*  CREATININE 1.84*  GLUCOSE 172*   Recent Labs  Lab 08/15/17 1620  HGB 13.1  HCT 39.8*  WBC 11.2*  PLT 321   Dg Chest  1 View  Result Date: 08/15/2017 CLINICAL DATA:  Severe shortness of breath, suspect pulmonary edema. History of previous MI, diabetes. EXAM: CHEST 1 VIEW COMPARISON:  Chest x-ray of November 03, 2017 FINDINGS: The lungs are well-expanded. The interstitial markings are diffusely increased. The pulmonary vascularity is engorged and indistinct. The cardiac silhouette is top-normal in size but not greatly changed. There is no pleural effusion. There is multilevel degenerative disc disease of the thoracic spine. IMPRESSION: Diffusely increased pulmonary interstitial markings likely reflects pulmonary edema. Widespread interstitial pneumonia could produce similar findings. Electronically Signed   By: David  Martinique M.D.   On: 08/15/2017 16:41    ASSESSMENT / PLAN:  Acute hypoxic respiratory failure related to pulmonary edema Hx of hypertension/Hyperlipidemia Hx of DM Acute Kidney Injury Mild leukocytosis possibly related to stress response   Plan Continue BiPAP, wean as tolerated F/u ECHO Blood glucose checks with SSI coverage Continue Aspirin Continue Diuresing with Lasix Continue Amlodipine/Lisinopril Cardiology consulted Follow BMP  Bill Peterson,AG-ACNP [facility] [place]   08/15/2017, 7:17 PM

## 2017-08-16 ENCOUNTER — Inpatient Hospital Stay: Payer: Medicare Other

## 2017-08-16 ENCOUNTER — Inpatient Hospital Stay
Admit: 2017-08-16 | Discharge: 2017-08-16 | Disposition: A | Discharge status: Home / Self Care | Payer: Medicare Other | Attending: Internal Medicine | Admitting: Internal Medicine

## 2017-08-16 DIAGNOSIS — J810 Acute pulmonary edema: Secondary | ICD-10-CM

## 2017-08-16 DIAGNOSIS — J9601 Acute respiratory failure with hypoxia: Secondary | ICD-10-CM

## 2017-08-16 LAB — BASIC METABOLIC PANEL
Anion gap: 13 (ref 5–15)
BUN: 27 mg/dL — AB (ref 6–20)
CALCIUM: 9.3 mg/dL (ref 8.9–10.3)
CO2: 25 mmol/L (ref 22–32)
CREATININE: 2.18 mg/dL — AB (ref 0.61–1.24)
Chloride: 104 mmol/L (ref 101–111)
GFR calc non Af Amer: 27 mL/min — ABNORMAL LOW (ref >60)
GFR, EST AFRICAN AMERICAN: 31 mL/min — AB (ref >60)
Glucose, Bld: 214 mg/dL — ABNORMAL HIGH (ref 65–99)
Potassium: 3.6 mmol/L (ref 3.5–5.1)
SODIUM: 142 mmol/L (ref 135–145)

## 2017-08-16 LAB — CBC
HCT: 39.1 % — ABNORMAL LOW (ref 40.0–52.0)
Hemoglobin: 13 g/dL (ref 13.0–18.0)
MCH: 31.1 pg (ref 26.0–34.0)
MCHC: 33.3 g/dL (ref 32.0–36.0)
MCV: 93.4 fL (ref 80.0–100.0)
PLATELETS: 265 10*3/uL (ref 150–440)
RBC: 4.19 MIL/uL — AB (ref 4.40–5.90)
RDW: 15 % — AB (ref 11.5–14.5)
WBC: 10.4 10*3/uL (ref 3.8–10.6)

## 2017-08-16 LAB — TROPONIN I
TROPONIN I: 0.04 ng/mL — AB (ref <0.03)
Troponin I: 0.08 ng/mL (ref <0.03)

## 2017-08-16 LAB — GLUCOSE, CAPILLARY
GLUCOSE-CAPILLARY: 179 mg/dL — AB (ref 65–99)
GLUCOSE-CAPILLARY: 65 mg/dL (ref 65–99)
Glucose-Capillary: 253 mg/dL — ABNORMAL HIGH (ref 65–99)
Glucose-Capillary: 147 mg/dL — ABNORMAL HIGH (ref 65–99)

## 2017-08-16 LAB — PSA: PROSTATE SPECIFIC AG, SERUM: 8.3 ng/mL — AB (ref 0.0–4.0)

## 2017-08-16 LAB — HEMOGLOBIN A1C
HEMOGLOBIN A1C: 6.9 % — AB (ref 4.8–5.6)
MEAN PLASMA GLUCOSE: 151.33 mg/dL

## 2017-08-16 MED ORDER — CARVEDILOL 6.25 MG PO TABS
6.2500 mg | ORAL_TABLET | Freq: Two times a day (BID) | ORAL | Status: DC
  Administered 2017-08-16 – 2017-08-19 (×5): 6.25 mg via ORAL
  Filled 2017-08-16 (×6): qty 1

## 2017-08-16 MED ORDER — ISOSORBIDE MONONITRATE ER 30 MG PO TB24
30.0000 mg | ORAL_TABLET | Freq: Every day | ORAL | Status: DC
  Administered 2017-08-16 – 2017-08-19 (×4): 30 mg via ORAL
  Filled 2017-08-16 (×4): qty 1

## 2017-08-16 MED ORDER — POTASSIUM CHLORIDE CRYS ER 20 MEQ PO TBCR
40.0000 meq | EXTENDED_RELEASE_TABLET | Freq: Two times a day (BID) | ORAL | Status: AC
  Administered 2017-08-16 – 2017-08-17 (×3): 40 meq via ORAL
  Filled 2017-08-16 (×3): qty 2

## 2017-08-16 MED ORDER — INSULIN ASPART 100 UNIT/ML ~~LOC~~ SOLN
3.0000 [IU] | Freq: Three times a day (TID) | SUBCUTANEOUS | Status: DC
  Administered 2017-08-16 – 2017-08-18 (×5): 3 [IU] via SUBCUTANEOUS
  Filled 2017-08-16 (×5): qty 1

## 2017-08-16 MED ORDER — IPRATROPIUM-ALBUTEROL 0.5-2.5 (3) MG/3ML IN SOLN: 3.0000 mL | RESPIRATORY_TRACT | Status: DC | PRN

## 2017-08-16 NOTE — Progress Notes (Signed)
*  PRELIMINARY RESULTS* Echocardiogram 2D Echocardiogram has been performed.  Bill Peterson 08/16/2017, 3:02 PM

## 2017-08-16 NOTE — Progress Notes (Signed)
[facility] [place] at [place] NAME: Bill Peterson    MR#:  [id number]  DATE OF BIRTH:  02/05/1935  SUBJECTIVE:   Patient off of BiPAP. Shortness of breath is improving.  REVIEW OF SYSTEMS:     Review of Systems  Constitutional: Negative for fever, chills weight loss HENT: Negative for ear pain, nosebleeds, congestion, facial swelling, rhinorrhea, neck pain, neck stiffness and ear discharge.   Respiratory: Negative for cough, shortness of breath, wheezing  Cardiovascular: Negative for chest pain, palpitations and leg swelling.  Gastrointestinal: Negative for heartburn, abdominal pain, vomiting, diarrhea or consitpation Genitourinary: Negative for dysuria, urgency, frequency, hematuria Musculoskeletal: Negative for back pain or joint pain Neurological: Negative for dizziness, seizures, syncope, focal weakness,  numbness and headaches.  Hematological: Does not bruise/bleed easily.  Psychiatric/Behavioral: Negative for hallucinations, confusion, dysphoric mood    Tolerating Diet:yes      DRUG ALLERGIES:   Allergies  Allergen Reactions  . Heparin     REACTION: Unspecified  . Repaglinide Other (See Comments)    Increased appetite Increased appetite Increased appetite     VITALS:  Blood pressure (!) 163/78, pulse (!) 112, temperature 98 F (36.7 C), temperature source Axillary, resp. rate (!) 24, height 5\' 6"  (1.676 m), weight 90 kg (198 lb 6.6 oz), SpO2 95 %.  PHYSICAL EXAMINATION:  Constitutional: Appears well-developed and well-nourished. No distress. HENT: Normocephalic. Marland Kitchen Oropharynx is clear and moist.  Eyes: Conjunctivae and EOM are normal. PERRLA, no scleral icterus.  Neck: Normal ROM. Neck supple. No JVD. No tracheal deviation. CVS: RRR, S1/S2 +, no murmurs, no gallops, no carotid bruit.  Pulmonary: Effort and breath sounds normal, no stridor, rhonchi, wheezes, rales.  Abdominal: Soft. BS +,  no distension, tenderness, rebound  or guarding.  Musculoskeletal: Normal range of motion. No edema and no tenderness.  Neuro: Alert. CN 2-12 grossly intact. No focal deficits. Skin: Skin is warm and dry. No rash noted. Psychiatric: Normal mood and affect.      LABORATORY PANEL:   CBC Recent Labs  Lab 08/16/17 0901  WBC 10.4  HGB 13.0  HCT 39.1*  PLT 265   ------------------------------------------------------------------------------------------------------------------  Chemistries  Recent Labs  Lab 08/16/17 0901  NA 142  K 3.6  CL 104  CO2 25  GLUCOSE 214*  BUN 27*  CREATININE 2.18*  CALCIUM 9.3   ------------------------------------------------------------------------------------------------------------------  Cardiac Enzymes Recent Labs  Lab 08/15/17 2012 08/16/17 0244 08/16/17 0901  TROPONINI 0.04* 0.04* 0.08*   ------------------------------------------------------------------------------------------------------------------  RADIOLOGY:  Dg Chest 1 View  Result Date: 08/15/2017 CLINICAL DATA:  Severe shortness of breath, suspect pulmonary edema. History of previous MI, diabetes. EXAM: CHEST 1 VIEW COMPARISON:  Chest x-ray of November 03, 2017 FINDINGS: The lungs are well-expanded. The interstitial markings are diffusely increased. The pulmonary vascularity is engorged and indistinct. The cardiac silhouette is top-normal in size but not greatly changed. There is no pleural effusion. There is multilevel degenerative disc disease of the thoracic spine. IMPRESSION: Diffusely increased pulmonary interstitial markings likely reflects pulmonary edema. Widespread interstitial pneumonia could produce similar findings. Electronically Signed   By: David  Martinique M.D.   On: 08/15/2017 16:41   Dg Chest Port 1 View  Result Date: 08/16/2017 CLINICAL DATA:  Congestive heart failure EXAM: PORTABLE CHEST 1 VIEW COMPARISON:  08/15/2017 FINDINGS: Persistence and worsening of diffuse interstitial and alveolar edema  pattern. Developing pleural effusions. No other change. IMPRESSION: Persistence and worsening of diffuse interstitial and alveolar edema. Developing pleural effusions. Electronically Signed  By: Nelson Chimes M.D.   On: 08/16/2017 06:37     ASSESSMENT AND PLAN:   82 year old male with history of diabetes, essential hypertension who presents with shortness of breath.  1. Acute hypoxic respiratory failure in the setting of new onset congestive heart failure/pulmonary edema Patient will continue to be weaned off of oxygen as tolerated  2. Acute congestive heart failure: Follow up on echocardiogram Follow up on cardiology consultation Monitor intake and output and daily weight Continue Lasix  3. Diabetes: Continue sliding scale Hemoglobin A1c 6.9 4. Essential hypertension: Continue Norvasc, Coreg, isosorbide and lisinopril  5. History of CAD: Continue Coreg, isosorbide, lisinopril, aspirin and statin 6. Acute kidney injury: Patient would benefit from nephrology consultation 7. Elevated troponin: This is due to demand ischemia from CHF exacerbation. Patient has ruled out for ACS.    Management plans discussed with the patient and he is in agreement.  CODE STATUS: full  TOTAL TIME TAKING CARE OF THIS PATIENT: 30 minutes.     POSSIBLE D/C 1-2 days, DEPENDING ON CLINICAL CONDITION.   [name] M.D on 08/16/2017 at 11:05 AM  Between 7am to 6pm - Pager - [phone] After 6pm go to [email or web address] - password [email or web address] [facility]  Office  [phone]  CC: Primary care physician; Kirk Ruths, MD  Note: This dictation was prepared with Dragon dictation along with smaller phrase technology. Any transcriptional errors that result from this process are unintentional.

## 2017-08-16 NOTE — Progress Notes (Signed)
Inpatient Diabetes Program Recommendations  AACE/ADA: New Consensus Statement on Inpatient Glycemic Control (2015)  Target Ranges:  Prepandial:   less than 140 mg/dL      Peak postprandial:   less than 180 mg/dL (1-2 hours)      Critically ill patients:  140 - 180 mg/dL   Lab Results  Component Value Date   GLUCAP 253 (H) 08/16/2017   HGBA1C 6.9 (H) 08/15/2017    Review of Glycemic Control   Results for Bill Peterson, Bill Peterson (MRN [id number]) as of 08/16/2017 15:05  Ref. Range 08/15/2017 20:01 08/15/2017 21:34 08/16/2017 07:26 08/16/2017 11:40  Glucose-Capillary Latest Ref Range: 65 - 99 mg/dL 160 (H) 154 (H) 179 (H) 253 (H)   Diabetes history: Type 2 Outpatient Diabetes medications: Amaryl 1mg  qam, Metformin 1000mg  bid, Actos 30 mg qday  Current orders for Inpatient glycemic control: Novolog 0-9 units tid, Novolog 0-5 units qhs and Novolog 3 units tid  Inpatient Diabetes Program Recommendations:   Agree with current medications for blood sugar management.   Gentry Fitz, RN, BA, MHA, CDE Diabetes Coordinator Inpatient Diabetes Program  [phone] (Team Pager) [phone] ([place]) 08/16/2017 3:07 PM

## 2017-08-16 NOTE — Progress Notes (Signed)
Now comfortable on [place] O2 No new complaints. Denies CP  Vitals:   08/16/17 1100 08/16/17 1200 08/16/17 1228 08/16/17 1300  BP: (!) 160/78 (!) 117/58  124/65  Pulse: (!) 118 94  (!) 103  Resp: (!) 29 18  (!) 22  Temp:   98.2 F (36.8 C)   TempSrc:   Oral   SpO2: 92% 96%  96%  Weight:      Height:       AND @ rest JVP not visualized Bibasilar crackles, no wheezes Tachycardia, regular, summation gallop, II/VI systolic murmur radiating to neck NABS, soft 2-3+ symmetric LE edema No focal neurologic deficits  BMP Latest Ref Rng & Units 08/16/2017 08/15/2017 11/06/2007  Glucose 65 - 99 mg/dL 214(H) 172(H) 122(H)  BUN 6 - 20 mg/dL 27(H) 22(H) 12  Creatinine 0.61 - 1.24 mg/dL 2.18(H) 1.84(H) 1.08  Sodium 135 - 145 mmol/L 142 139 137  Potassium 3.5 - 5.1 mmol/L 3.6 3.7 4.3  Chloride 101 - 111 mmol/L 104 102 105  CO2 22 - 32 mmol/L 25 27 28   Calcium 8.9 - 10.3 mg/dL 9.3 9.2 8.6    CBC Latest Ref Rng & Units 08/16/2017 08/15/2017 11/04/2007  WBC 3.8 - 10.6 K/uL 10.4 11.2(H) 6.3  Hemoglobin 13.0 - 18.0 g/dL 13.0 13.1 11.9(L)  Hematocrit 40.0 - 52.0 % 39.1(L) 39.8(L) 34.9(L)  Platelets 150 - 440 K/uL 265 321 222    CXR: Moderate to severe edema pattern  IMPRESSION: Acute hypoxemic respiratory failure Pulmonary edema Suspect severe cardiomyopathy Sinus tachycardia Acute kidney injury Hypervolemia  PLAN/REC: Continue diuresis as permitted by BP and renal function Initiate low-dose carvedilol Follow-up on echocardiogram results Cardiology and nephrology consultations have been requested We will keep in SDU through today in case he needs further noninvasive ventilatory support  Merton Border, MD PCCM service Mobile [phone] Pager [phone] 08/16/2017 3:20 PM

## 2017-08-17 ENCOUNTER — Inpatient Hospital Stay: Payer: Medicare Other

## 2017-08-17 ENCOUNTER — Telehealth: Payer: Self-pay

## 2017-08-17 ENCOUNTER — Other Ambulatory Visit: Payer: Self-pay | Admitting: Urology

## 2017-08-17 DIAGNOSIS — R972 Elevated prostate specific antigen [PSA]: Secondary | ICD-10-CM

## 2017-08-17 LAB — BASIC METABOLIC PANEL
Anion gap: 7 (ref 5–15)
BUN: 38 mg/dL — AB (ref 6–20)
CHLORIDE: 101 mmol/L (ref 101–111)
CO2: 32 mmol/L (ref 22–32)
CREATININE: 2.22 mg/dL — AB (ref 0.61–1.24)
Calcium: 8.9 mg/dL (ref 8.9–10.3)
GFR calc Af Amer: 30 mL/min — ABNORMAL LOW (ref >60)
GFR calc non Af Amer: 26 mL/min — ABNORMAL LOW (ref >60)
GLUCOSE: 244 mg/dL — AB (ref 65–99)
Potassium: 4.6 mmol/L (ref 3.5–5.1)
SODIUM: 140 mmol/L (ref 135–145)

## 2017-08-17 LAB — ECHOCARDIOGRAM COMPLETE
Height: 66 in
WEIGHTICAEL: 3174.62 [oz_av]

## 2017-08-17 LAB — GLUCOSE, CAPILLARY
GLUCOSE-CAPILLARY: 121 mg/dL — AB (ref 65–99)
GLUCOSE-CAPILLARY: 84 mg/dL (ref 65–99)
Glucose-Capillary: 117 mg/dL — ABNORMAL HIGH (ref 65–99)
Glucose-Capillary: 212 mg/dL — ABNORMAL HIGH (ref 65–99)
Glucose-Capillary: 148 mg/dL — ABNORMAL HIGH (ref 65–99)
Glucose-Capillary: 59 mg/dL — ABNORMAL LOW (ref 65–99)

## 2017-08-17 NOTE — Progress Notes (Signed)
Inpatient Diabetes Program Recommendations  AACE/ADA: New Consensus Statement on Inpatient Glycemic Control (2015)  Target Ranges:  Prepandial:   less than 140 mg/dL      Peak postprandial:   less than 180 mg/dL (1-2 hours)      Critically ill patients:  140 - 180 mg/dL   Lab Results  Component Value Date   GLUCAP 117 (H) 08/17/2017   HGBA1C 6.9 (H) 08/15/2017    Review of Glycemic Control  Results for EARVIN, BLAZIER (MRN [id number]) as of 08/17/2017 10:34  Ref. Range 08/16/2017 11:40 08/16/2017 16:25 08/16/2017 22:15 08/17/2017 06:07 08/17/2017 07:30  Glucose-Capillary Latest Ref Range: 65 - 99 mg/dL 253 (H) 147 (H) 65 121 (H) 117 (H)    Diabetes history: Type 2 Outpatient Diabetes medications: Amaryl 1mg  qam, Metformin 1000mg  bid, Actos 30 mg qday  Current orders for Inpatient glycemic control: Novolog 0-9 units tid, Novolog 0-5 units qhs and Novolog 3 units tid  Inpatient Diabetes Program Recommendations:  Please decrease Novolog mealtime insulin to 2 units tid * please ensure mealtime Novolog is given ONLY if the patient eats more than 50%  Gentry Fitz, RN, [place], [place], CDE Diabetes Coordinator Inpatient Diabetes Program  [phone] (Team Pager) [phone] ([place]) 08/17/2017 10:40 AM

## 2017-08-17 NOTE — Progress Notes (Signed)
[facility] at [place] NAME: Bill Peterson    MR#:  [id number]  DATE OF BIRTH:  1935-04-09  SUBJECTIVE:   Patient on nasal cannula and off of BiPAP. Shortness of breath is improved. He still has PND and orthopnea. He still has lower extremity edema.Marland Kitchen  REVIEW OF SYSTEMS:     Review of Systems  Constitutional: Negative for fever, chills weight loss HENT: Negative for ear pain, nosebleeds, congestion, facial swelling, rhinorrhea, neck pain, neck stiffness and ear discharge.   Respiratory: Negative for cough, shortness of breath, wheezing  Cardiovascular: Denies chest pain. Positive lower extremity edema positive PND and orthopnea  Gastrointestinal: Negative for heartburn, abdominal pain, vomiting, diarrhea or consitpation Genitourinary: Negative for dysuria, urgency, frequency, hematuria Musculoskeletal: Negative for back pain or joint pain Neurological: Negative for dizziness, seizures, syncope, focal weakness,  numbness and headaches.  Hematological: Does not bruise/bleed easily.  Psychiatric/Behavioral: Negative for hallucinations, confusion, dysphoric mood    Tolerating Diet:yes      DRUG ALLERGIES:   Allergies  Allergen Reactions  . Heparin     REACTION: Unspecified  . Repaglinide Other (See Comments)    Increased appetite Increased appetite Increased appetite     VITALS:  Blood pressure (!) 95/53, pulse 73, temperature 97.9 F (36.6 C), temperature source Axillary, resp. rate 15, height 5\' 6"  (1.676 m), weight 91.6 kg (201 lb 15.1 oz), SpO2 92 %.  PHYSICAL EXAMINATION:  Constitutional: Appears well-developed and well-nourished. No distress. HENT: Normocephalic. Marland Kitchen Oropharynx is clear and moist.  Eyes: Conjunctivae and EOM are normal. PERRLA, no scleral icterus.  Neck: Normal ROM. Neck supple. Positive JVD. No tracheal deviation. CVS: RRR, S1/S2 +, no murmurs, no gallops, no carotid bruit.  Pulmonary: Normal effort with  bilateral crackles at bases  Abdominal: Soft. BS +,  no distension, tenderness, rebound or guarding.  Musculoskeletal: Normal range of motion. 1+ LEE and no tenderness.  Neuro: Alert. CN 2-12 grossly intact. No focal deficits. Skin: Skin is warm and dry. No rash noted. Psychiatric: Normal mood and affect.      LABORATORY PANEL:   CBC Recent Labs  Lab 08/16/17 0901  WBC 10.4  HGB 13.0  HCT 39.1*  PLT 265   ------------------------------------------------------------------------------------------------------------------  Chemistries  Recent Labs  Lab 08/16/17 0901  NA 142  K 3.6  CL 104  CO2 25  GLUCOSE 214*  BUN 27*  CREATININE 2.18*  CALCIUM 9.3   ------------------------------------------------------------------------------------------------------------------  Cardiac Enzymes Recent Labs  Lab 08/15/17 2012 08/16/17 0244 08/16/17 0901  TROPONINI 0.04* 0.04* 0.08*   ------------------------------------------------------------------------------------------------------------------  RADIOLOGY:  Dg Chest 1 View  Result Date: 08/15/2017 CLINICAL DATA:  Severe shortness of breath, suspect pulmonary edema. History of previous MI, diabetes. EXAM: CHEST 1 VIEW COMPARISON:  Chest x-ray of November 03, 2017 FINDINGS: The lungs are well-expanded. The interstitial markings are diffusely increased. The pulmonary vascularity is engorged and indistinct. The cardiac silhouette is top-normal in size but not greatly changed. There is no pleural effusion. There is multilevel degenerative disc disease of the thoracic spine. IMPRESSION: Diffusely increased pulmonary interstitial markings likely reflects pulmonary edema. Widespread interstitial pneumonia could produce similar findings. Electronically Signed   By: David  Martinique M.D.   On: 08/15/2017 16:41   Dg Chest Port 1 View  Result Date: 08/17/2017 CLINICAL DATA:  Shortness of breath.  Pulmonary edema. EXAM: PORTABLE CHEST 1 VIEW  COMPARISON:  08/16/2017.  08/15/2017. FINDINGS: Cardiomegaly with bilateral pulmonary infiltrates/edema, partially cleared from prior exam. Small bilateral  pleural effusions. No pneumothorax. No acute bony abnormality . IMPRESSION: Cardiomegaly with bilateral pulmonary infiltrates/edema, partially cleared from prior exams. Small bilateral pleural effusions again noted. Electronically Signed   By: Marcello Moores  Register   On: 08/17/2017 10:17   Dg Chest Port 1 View  Result Date: 08/16/2017 CLINICAL DATA:  Congestive heart failure EXAM: PORTABLE CHEST 1 VIEW COMPARISON:  08/15/2017 FINDINGS: Persistence and worsening of diffuse interstitial and alveolar edema pattern. Developing pleural effusions. No other change. IMPRESSION: Persistence and worsening of diffuse interstitial and alveolar edema. Developing pleural effusions. Electronically Signed   By: Nelson Chimes M.D.   On: 08/16/2017 06:37     ASSESSMENT AND PLAN:   82 year old male with history of diabetes, essential hypertension who presents with shortness of breath.  1. Acute hypoxic respiratory failure in the setting of new onset congestive heart failure/pulmonary edema Patient will continue to be weaned off of oxygen as tolerated  2. Acute congestive heart failure: Follow up on echocardiogram Follow up on cardiology consultation Monitor intake and output and daily weight Continue Lasix CHF referral upon discharge  3. Diabetes: Continue sliding scale Hemoglobin A1c 6.9  4. Essential hypertension: Continue Norvasc, Coreg, isosorbide and lisinopril  5. History of CAD: Continue Coreg, isosorbide, lisinopril, aspirin and statin 6. Acute kidney injury: This is in the setting of CHF as well as diabetes Management as per nephrology   7. Elevated troponin: This is due to demand ischemia from CHF exacerbation. Patient has ruled out for ACS.  Discuss with nephrology today.  Management plans discussed with the patient and wife and he is in  agreement.  CODE STATUS: full  TOTAL TIME TAKING CARE OF THIS PATIENT: 24 minutes.     POSSIBLE D/C 1-2 days, DEPENDING ON CLINICAL CONDITION.   Bill Peterson M.D on 08/17/2017 at 10:53 AM  Between 7am to 6pm - Pager - [phone] After 6pm go to [email or web address] - password [email or web address] [facility]  Office  [phone]  CC: Primary care physician; Kirk Ruths, MD  Note: This dictation was prepared with Dragon dictation along with smaller phrase technology. Any transcriptional errors that result from this process are unintentional.

## 2017-08-17 NOTE — Progress Notes (Signed)
[facility]) Care Management  08/17/2017  TILAK OAKLEY 10-03-1934 [id number]   Spoke with RN Adam regarding lunchtime blood sugar today- the high number of 212mg /dl at 1208pm is likely a result of a late breakfast.  Gentry Fitz, RN, BA, MHA, CDE Diabetes Coordinator Inpatient Diabetes Program  [phone] (Team Pager) [phone] ([place]) 08/17/2017 1:35 PM

## 2017-08-17 NOTE — Consult Note (Signed)
[facility]  CONSULT NOTE    Date: 08/17/2017                  Patient Name:  Bill Peterson  MRN: [id number]  DOB: 1935-07-06  Age / Sex: 82 y.o., male         PCP: Kirk Ruths, MD                 Service Requesting Consult: Dr. Benjie Karvonen                 Reason for Consult: Acute Renal Failure            History of Present Illness: Mr. Bill Peterson is a 82 y.o. black male with diabetes mellitus type II noninsulin dependent, hypertension, coronary artery disease, BPH, history of hematuria , who was admitted to [facility] on 08/15/2017 for Flash pulmonary edema ([place]) [J81.0] Hypoxia [R09.02]  Wife at bedside and assists with history taking. Patient developed shortness of breath that has been progressive with peripheral edema for last few weeks. Went to see his urologist on 12/19 but then developed shortness of breath and came back to the hospital for evaluation of shortness of breath.   Furosemide IV given. Creatinine of 1.8 and jumped to 2.18. Nephrology consulted. UOP 2061mL.    Medications: Outpatient medications: Medications Prior to Admission  Medication Sig Dispense Refill Last Dose  . amLODipine (NORVASC) 10 MG tablet Take 10 mg by mouth daily.   08/15/2017 at AM  . Apple Cider Vinegar 300 MG TABS Take by mouth.   08/15/2017 at AM  . aspirin 325 MG tablet Take 325 mg by mouth daily.     08/15/2017 at AM  . atorvastatin (LIPITOR) 20 MG tablet Take 20 mg by mouth daily.   08/14/2017 at PM  . furosemide (LASIX) 20 MG tablet Take 20 mg by mouth.   08/15/2017 at AM  . glimepiride (AMARYL) 1 MG tablet Take 1 mg by mouth daily with breakfast.   08/15/2017 at AM  . lisinopril (PRINIVIL,ZESTRIL) 20 MG tablet Take 20 mg by mouth daily.   08/15/2017 at AM  . lisinopril-hydrochlorothiazide (PRINZIDE,ZESTORETIC) 20-12.5 MG tablet Take 1 tablet by mouth daily.   08/15/2017 at AM  . metFORMIN (GLUCOPHAGE) 1000 MG tablet Take 1,000 mg by mouth 2 (two) times daily  with a meal.   08/15/2017 at AM  . Multiple Vitamin (MULTIVITAMIN) capsule Take 1 capsule by mouth daily.     08/15/2017 at AM  . pioglitazone (ACTOS) 30 MG tablet Take 30 mg by mouth daily.   08/15/2017 at AM  . Vitamins C E (VITAMIN C & E COMBINATION PO) Take by mouth.     08/15/2017 at AM    Current medications: Current Facility-Administered Medications  Medication Dose Route Frequency Provider Last Rate Last Dose  . acetaminophen (TYLENOL) tablet 650 mg  650 mg Oral Q6H PRN Gladstone Lighter, MD       Or  . acetaminophen (TYLENOL) suppository 650 mg  650 mg Rectal Q6H PRN Gladstone Lighter, MD      . amLODipine (NORVASC) tablet 10 mg  10 mg Oral Daily Gladstone Lighter, MD   10 mg at 08/16/17 1127  . aspirin tablet 325 mg  325 mg Oral Daily Gladstone Lighter, MD   325 mg at 08/16/17 1128  . atorvastatin (LIPITOR) tablet 20 mg  20 mg Oral Daily Gladstone Lighter, MD   20 mg at 08/16/17 1128  . carvedilol (COREG)  tablet 6.25 mg  6.25 mg Oral BID WC Wilhelmina Mcardle, MD   6.25 mg at 08/17/17 4665  . furosemide (LASIX) injection 40 mg  40 mg Intravenous Q12H Gladstone Lighter, MD   40 mg at 08/17/17 9935  . hydrALAZINE (APRESOLINE) injection 10 mg  10 mg Intravenous Q6H PRN Gladstone Lighter, MD      . insulin aspart (novoLOG) injection 0-5 Units  0-5 Units Subcutaneous QHS Gladstone Lighter, MD      . insulin aspart (novoLOG) injection 0-9 Units  0-9 Units Subcutaneous TID WC Gladstone Lighter, MD   1 Units at 08/16/17 1703  . insulin aspart (novoLOG) injection 3 Units  3 Units Subcutaneous TID WC [name], NP   3 Units at 08/17/17 [phone]  . ipratropium-albuterol (DUONEB) 0.5-2.5 (3) MG/3ML nebulizer solution 3 mL  3 mL Nebulization Q4H PRN Wilhelmina Mcardle, MD      . isosorbide mononitrate (IMDUR) 24 hr tablet 30 mg  30 mg Oral Daily Wilhelmina Mcardle, MD   30 mg at 08/16/17 1130  . lisinopril (PRINIVIL,ZESTRIL) tablet 20 mg  20 mg Oral Daily Gladstone Lighter, MD   20 mg at  08/16/17 1128  . multivitamin with minerals tablet 1 tablet  1 tablet Oral Daily Gladstone Lighter, MD   1 tablet at 08/16/17 1128  . ondansetron (ZOFRAN) tablet 4 mg  4 mg Oral Q6H PRN Gladstone Lighter, MD       Or  . ondansetron (ZOFRAN) injection 4 mg  4 mg Intravenous Q6H PRN Gladstone Lighter, MD      . potassium chloride SA (K-DUR,KLOR-CON) CR tablet 40 mEq  40 mEq Oral BID Wilhelmina Mcardle, MD   40 mEq at 08/16/17 2222      Allergies: Allergies  Allergen Reactions  . Heparin     REACTION: Unspecified  . Repaglinide Other (See Comments)    Increased appetite Increased appetite Increased appetite       Past Medical History: Past Medical History:  Diagnosis Date  . Diabetes mellitus without complication ([name])   . Hyperlipidemia   . Hypertension      Past Surgical History: Past Surgical History:  Procedure Laterality Date  . BACK SURGERY    . CARDIAC SURGERY    . STOMACH SURGERY       Family History: Family History  Problem Relation Age of Onset  . Heart disease Mother   . Diabetes Father   . Prostate cancer Neg Hx   . Kidney cancer Neg Hx   . Bladder Cancer Neg Hx      Social History: Social History   Socioeconomic History  . Marital status: Married    Spouse name: Not on file  . Number of children: Not on file  . Years of education: Not on file  . Highest education level: Not on file  Social Needs  . Financial resource strain: Not on file  . Food insecurity - worry: Not on file  . Food insecurity - inability: Not on file  . Transportation needs - medical: Not on file  . Transportation needs - non-medical: Not on file  Occupational History  . Not on file  Tobacco Use  . Smoking status: Never Smoker  . Smokeless tobacco: Never Used  Substance and Sexual Activity  . Alcohol use: No  . Drug use: No  . Sexual activity: Not on file  Other Topics Concern  . Not on file  Social History Narrative   Lives at home with  his wife. Active at  baseline     Review of Systems: Review of Systems  Constitutional: Negative.  Negative for chills, diaphoresis, fever, malaise/fatigue and weight loss.  HENT: Negative.  Negative for congestion, ear discharge, ear pain, hearing loss, nosebleeds, sinus pain, sore throat and tinnitus.   Eyes: Negative.  Negative for blurred vision, double vision, photophobia, pain, discharge and redness.  Respiratory: Positive for shortness of breath and wheezing. Negative for cough, hemoptysis, sputum production and stridor.   Cardiovascular: Positive for leg swelling. Negative for chest pain, palpitations, orthopnea, claudication and PND.  Gastrointestinal: Negative.  Negative for abdominal pain, blood in stool, constipation, diarrhea, heartburn, melena, nausea and vomiting.  Genitourinary: Negative.  Negative for dysuria, flank pain, frequency, hematuria and urgency.  Musculoskeletal: Negative.  Negative for back pain, falls, joint pain, myalgias and neck pain.  Skin: Negative.  Negative for itching and rash.  Neurological: Negative.  Negative for dizziness, tingling, tremors, sensory change, speech change, focal weakness, seizures, loss of consciousness, weakness and headaches.  Endo/Heme/Allergies: Negative.  Negative for environmental allergies and polydipsia. Does not bruise/bleed easily.  Psychiatric/Behavioral: Negative.  Negative for depression, hallucinations, memory loss, substance abuse and suicidal ideas. The patient is not nervous/anxious and does not have insomnia.     Vital Signs: Blood pressure (!) 95/53, pulse 73, temperature 97.9 F (36.6 C), temperature source Axillary, resp. rate 15, height 5\' 6"  (1.676 m), weight 91.6 kg (201 lb 15.1 oz), SpO2 92 %.  Weight trends: Filed Weights   08/15/17 2000 08/16/17 2200  Weight: 90 kg (198 lb 6.6 oz) 91.6 kg (201 lb 15.1 oz)    Physical Exam: General: NAD, laying in bed  Head: Normocephalic, atraumatic. Moist oral mucosal membranes  Eyes:  Anicteric, PERRL  Neck: Supple, trachea midline  Lungs:  Clear to auscultation  Heart: Regular rate and rhythm  Abdomen:  Soft, nontender,   Extremities: 1+ peripheral edema.  Neurologic: Nonfocal, moving all four extremities  Skin: No lesions        Lab results: Basic Metabolic Panel: Recent Labs  Lab 08/15/17 1620 08/16/17 0901  NA 139 142  K 3.7 3.6  CL 102 104  CO2 27 25  GLUCOSE 172* 214*  BUN 22* 27*  CREATININE 1.84* 2.18*  CALCIUM 9.2 9.3    Liver Function Tests: No results for input(s): AST, ALT, ALKPHOS, BILITOT, PROT, ALBUMIN in the last 168 hours. No results for input(s): LIPASE, AMYLASE in the last 168 hours. No results for input(s): AMMONIA in the last 168 hours.  CBC: Recent Labs  Lab 08/15/17 1620 08/16/17 0901  WBC 11.2* 10.4  NEUTROABS 5.3  --   HGB 13.1 13.0  HCT 39.8* 39.1*  MCV 94.5 93.4  PLT 321 265    Cardiac Enzymes: Recent Labs  Lab 08/15/17 1620 08/15/17 2012 08/16/17 0244 08/16/17 0901  TROPONINI <0.03 0.04* 0.04* 0.08*    BNP: Invalid input(s): POCBNP  CBG: Recent Labs  Lab 08/16/17 1140 08/16/17 1625 08/16/17 2215 08/17/17 0607 08/17/17 0730  GLUCAP 253* 147* 64 121* 117*    Microbiology: Results for orders placed or performed during the hospital encounter of 08/15/17  MRSA PCR Screening     Status: None   Collection Time: 08/15/17  7:58 PM  Result Value Ref Range Status   MRSA by PCR NEGATIVE NEGATIVE Final    Comment:        The GeneXpert MRSA Assay (FDA approved for NASAL specimens only), is one component of a comprehensive MRSA colonization surveillance  program. It is not intended to diagnose MRSA infection nor to guide or monitor treatment for MRSA infections.     Coagulation Studies: No results for input(s): LABPROT, INR in the last 72 hours.  Urinalysis: No results for input(s): COLORURINE, LABSPEC, PHURINE, GLUCOSEU, HGBUR, BILIRUBINUR, KETONESUR, PROTEINUR, UROBILINOGEN, NITRITE,  LEUKOCYTESUR in the last 72 hours.  Invalid input(s): APPERANCEUR    Imaging: Dg Chest 1 View  Result Date: 08/15/2017 CLINICAL DATA:  Severe shortness of breath, suspect pulmonary edema. History of previous MI, diabetes. EXAM: CHEST 1 VIEW COMPARISON:  Chest x-ray of November 03, 2017 FINDINGS: The lungs are well-expanded. The interstitial markings are diffusely increased. The pulmonary vascularity is engorged and indistinct. The cardiac silhouette is top-normal in size but not greatly changed. There is no pleural effusion. There is multilevel degenerative disc disease of the thoracic spine. IMPRESSION: Diffusely increased pulmonary interstitial markings likely reflects pulmonary edema. Widespread interstitial pneumonia could produce similar findings. Electronically Signed   By: David  Martinique M.D.   On: 08/15/2017 16:41   Dg Chest Port 1 View  Result Date: 08/16/2017 CLINICAL DATA:  Congestive heart failure EXAM: PORTABLE CHEST 1 VIEW COMPARISON:  08/15/2017 FINDINGS: Persistence and worsening of diffuse interstitial and alveolar edema pattern. Developing pleural effusions. No other change. IMPRESSION: Persistence and worsening of diffuse interstitial and alveolar edema. Developing pleural effusions. Electronically Signed   By: Nelson Chimes M.D.   On: 08/16/2017 06:37      Assessment & Plan: Mr. GREGGORY SAFRANEK is a 82 y.o. black male with diabetes mellitus type II noninsulin dependent, hypertension, coronary artery disease, BPH, history of hematuria , who was admitted to [facility] on 08/15/2017 for Flash pulmonary edema ([place]) [J81.0] Hypoxia [R09.02]  1. Acute renal failure on chronic kidney disease stage III: baseline creatinine of 1.8, GFR of 37 on 07/30/17.  Chronic kidney disease most likely secondary to diabetes and hypertension Acute renal failure from acute cardiorenal syndrome most likely - Check renal ultrasound - Check urine studies - Continue furosemide - Continue lisinopril  2.  Hypertension with acute congestive heart failure: echocardiogram pending.  - IV furosemide - amlodipine, carvedilol, lisinopril, isosorbide mononitrate  3. Diabetes mellitus type II with chronic kidney disease: noninsulin dependent. On metformin as outpatient. Holding metformin.  Hemoglobin A1c of 6.9%  4. Hematuria: history of prostate biopsy and negative urology work up  - Check urinalysis  LOS: [place], [place] 12/21/20189:20 AM

## 2017-08-17 NOTE — Telephone Encounter (Signed)
Left message for the patient to call back to discuss the MRI prostate instructions. I have mailed a copy to him. [facility] [facility] will be calling him to schedule.  Sharyn Lull

## 2017-08-17 NOTE — Telephone Encounter (Signed)
Left pt mess to call/SW  Please schedule and contact patient with date and time, thanks

## 2017-08-17 NOTE — Telephone Encounter (Signed)
-----   Message from Abbie Sons, MD sent at 08/17/2017  7:31 AM EST ----- PSA has risen to 8.3.  Recommend scheduling prostate MRI.

## 2017-08-17 NOTE — Progress Notes (Signed)
Sitting in chair no c/o shortness of breath currently on [place].  Attempted to transition pt to RA , however O2 sats decreased to 87% No new complaints. Denies CP  Vitals:   08/17/17 0757 08/17/17 0800 08/17/17 0900 08/17/17 1000  BP:  113/63 (!) 109/54 (!) 116/59  Pulse:  92 81 86  Resp:  17 18 19   Temp:      TempSrc:      SpO2: 92% 95% 94% 93%  Weight:      Height:       Well developed, well nourished, NAD  Supple, no JVD Clear throughout, even, non labored; no wheezes, rales, or rhonchi present  Sinus rhthym, regular, summation gallop, II/VI systolic murmur radiating to neck NABS, soft 2-3+ symmetric LE edema No focal neurologic deficits  BMP Latest Ref Rng & Units 08/16/2017 08/15/2017 11/06/2007  Glucose 65 - 99 mg/dL 214(H) 172(H) 122(H)  BUN 6 - 20 mg/dL 27(H) 22(H) 12  Creatinine 0.61 - 1.24 mg/dL 2.18(H) 1.84(H) 1.08  Sodium 135 - 145 mmol/L 142 139 137  Potassium 3.5 - 5.1 mmol/L 3.6 3.7 4.3  Chloride 101 - 111 mmol/L 104 102 105  CO2 22 - 32 mmol/L 25 27 28   Calcium 8.9 - 10.3 mg/dL 9.3 9.2 8.6    CBC Latest Ref Rng & Units 08/16/2017 08/15/2017 11/04/2007  WBC 3.8 - 10.6 K/uL 10.4 11.2(H) 6.3  Hemoglobin 13.0 - 18.0 g/dL 13.0 13.1 11.9(L)  Hematocrit 40.0 - 52.0 % 39.1(L) 39.8(L) 34.9(L)  Platelets 150 - 440 K/uL 265 321 222    CXR: Moderate to severe edema pattern  IMPRESSION: Acute hypoxemic respiratory failure Pulmonary edema Suspect severe cardiomyopathy Sinus tachycardia Acute kidney injury Hypervolemia  PLAN/REC: Continue diuresis as permitted by BP and renal function Continue low-dose carvedilol Follow-up on echocardiogram results Cardiology and nephrology consultations have been requested Will continue to monitor in [place] [facility] for now, will assess later this evening regarding transfer to medsurg unit   Marda Stalker, [place] Pager [phone] (please enter 7 digits) PCCM Consult Pager [phone] (please enter 7 digits)

## 2017-08-17 NOTE — Progress Notes (Signed)
Pt's bedtime blood sugar was 59. Given juice, crackers and peanut butter. Now 84. Will monitor

## 2017-08-18 LAB — GLUCOSE, CAPILLARY
GLUCOSE-CAPILLARY: 174 mg/dL — AB (ref 65–99)
GLUCOSE-CAPILLARY: 123 mg/dL — AB (ref 65–99)
GLUCOSE-CAPILLARY: 213 mg/dL — AB (ref 65–99)
Glucose-Capillary: 155 mg/dL — ABNORMAL HIGH (ref 65–99)

## 2017-08-18 LAB — CBC WITH DIFFERENTIAL/PLATELET
Basophils Absolute: 0 10*3/uL (ref 0–0.1)
Basophils Relative: 1 %
EOS ABS: 0.1 10*3/uL (ref 0–0.7)
Eosinophils Relative: 2 %
HEMATOCRIT: 31.4 % — AB (ref 40.0–52.0)
HEMOGLOBIN: 10.7 g/dL — AB (ref 13.0–18.0)
LYMPHS ABS: 0.8 10*3/uL — AB (ref 1.0–3.6)
LYMPHS PCT: 15 %
MCH: 31.5 pg (ref 26.0–34.0)
MCHC: 33.9 g/dL (ref 32.0–36.0)
MCV: 92.9 fL (ref 80.0–100.0)
MONOS PCT: 13 %
Monocytes Absolute: 0.7 10*3/uL (ref 0.2–1.0)
NEUTROS PCT: 69 %
Neutro Abs: 3.9 10*3/uL (ref 1.4–6.5)
Platelets: 181 10*3/uL (ref 150–440)
RBC: 3.38 MIL/uL — AB (ref 4.40–5.90)
RDW: 14.8 % — ABNORMAL HIGH (ref 11.5–14.5)
WBC: 5.5 10*3/uL (ref 3.8–10.6)

## 2017-08-18 LAB — MAGNESIUM: MAGNESIUM: 1.9 mg/dL (ref 1.7–2.4)

## 2017-08-18 LAB — URIC ACID: URIC ACID, SERUM: 9.6 mg/dL — AB (ref 4.4–7.6)

## 2017-08-18 LAB — RENAL FUNCTION PANEL
Albumin: 3 g/dL — ABNORMAL LOW (ref 3.5–5.0)
Anion gap: 8 (ref 5–15)
BUN: 41 mg/dL — ABNORMAL HIGH (ref 6–20)
CHLORIDE: 102 mmol/L (ref 101–111)
CO2: 28 mmol/L (ref 22–32)
CREATININE: 2.01 mg/dL — AB (ref 0.61–1.24)
Calcium: 8.8 mg/dL — ABNORMAL LOW (ref 8.9–10.3)
GFR calc non Af Amer: 30 mL/min — ABNORMAL LOW (ref >60)
GFR, EST AFRICAN AMERICAN: 34 mL/min — AB (ref >60)
Glucose, Bld: 158 mg/dL — ABNORMAL HIGH (ref 65–99)
POTASSIUM: 3.9 mmol/L (ref 3.5–5.1)
Phosphorus: 3.6 mg/dL (ref 2.5–4.6)
Sodium: 138 mmol/L (ref 135–145)

## 2017-08-18 LAB — PHOSPHORUS: Phosphorus: 3.7 mg/dL (ref 2.5–4.6)

## 2017-08-18 MED ORDER — FUROSEMIDE 40 MG PO TABS
40.0000 mg | ORAL_TABLET | Freq: Two times a day (BID) | ORAL | Status: DC
  Administered 2017-08-18 – 2017-08-19 (×2): 40 mg via ORAL
  Filled 2017-08-18 (×2): qty 1
  Filled 2017-08-18: qty 2

## 2017-08-18 NOTE — Progress Notes (Signed)
[place] at [place] NAME: Bill Peterson    MR#:  [id number]  DATE OF BIRTH:  1934/11/14  SUBJECTIVE:   Patient on nasal cannula 3L and off of BiPAP.  Mil Shortness of breath and cough.  REVIEW OF SYSTEMS:     Review of Systems  Constitutional: Negative for fever, chills weight loss.  Generalized weakness. HENT: Negative for ear pain, nosebleeds, congestion, facial swelling, rhinorrhea, neck pain, neck stiffness and ear discharge.   Respiratory: mild cough, shortness of breath, no wheezing  Cardiovascular: Denies chest pain. no lower extremity edema or PND and orthopnea  Gastrointestinal: Negative for heartburn, abdominal pain, vomiting, diarrhea or consitpation Genitourinary: Negative for dysuria, urgency, frequency, hematuria Musculoskeletal: Negative for back pain or joint pain Neurological: Negative for dizziness, seizures, syncope, focal weakness,  numbness and headaches.  Hematological: Does not bruise/bleed easily.   DRUG ALLERGIES:   Allergies  Allergen Reactions  . Heparin     REACTION: Unspecified  . Repaglinide Other (See Comments)    Increased appetite Increased appetite Increased appetite     VITALS:  Blood pressure (!) 106/52, pulse 79, temperature 98.4 F (36.9 C), temperature source Oral, resp. rate 14, height 5\' 6"  (1.676 m), weight 182 lb 12.8 oz (82.9 kg), SpO2 98 %.  PHYSICAL EXAMINATION:  Constitutional: Appears well-developed and well-nourished. No distress. HENT: Normocephalic. Marland Kitchen Oropharynx is clear and moist.  Eyes: Conjunctivae and EOM are normal. PERRLA, no scleral icterus.  Neck: Normal ROM. Neck supple. No JVD. No tracheal deviation. CVS: RRR, S1/S2 +, no murmurs, no gallops, no carotid bruit.  Pulmonary: Normal effort with bilateral mild rales at bases  Abdominal: Soft. BS +,  no distension, tenderness, rebound or guarding.  Musculoskeletal: Normal range of motion. No leg edema and no tenderness.   Neuro: Alert. CN 2-12 grossly intact. No focal deficits. Skin: Skin is warm and dry. No rash noted. Psychiatric: Normal mood and affect.  LABORATORY PANEL:   CBC Recent Labs  Lab 08/18/17 0500  WBC 5.5  HGB 10.7*  HCT 31.4*  PLT 181   ------------------------------------------------------------------------------------------------------------------  Chemistries  Recent Labs  Lab 08/18/17 0500  NA 138  K 3.9  CL 102  CO2 28  GLUCOSE 158*  BUN 41*  CREATININE 2.01*  CALCIUM 8.8*  MG 1.9   ------------------------------------------------------------------------------------------------------------------  Cardiac Enzymes Recent Labs  Lab 08/15/17 2012 08/16/17 0244 08/16/17 0901  TROPONINI 0.04* 0.04* 0.08*   ------------------------------------------------------------------------------------------------------------------  RADIOLOGY:  US Renal  Result Date: 08/17/2017 CLINICAL DATA:  Acute kidney failure EXAM: RENAL / URINARY TRACT ULTRASOUND COMPLETE COMPARISON:  CT 05/20/2010 FINDINGS: Right Kidney: Length: 9.6 cm. 3 cm benign-appearing simple cyst in the upper pole. No hydronephrosis. Normal echotexture. Left Kidney: Length: 9.3 cm.  Normal echotexture.  No mass or hydronephrosis. Bladder: Appears normal for degree of bladder distention. IMPRESSION: No acute findings.  No hydronephrosis. Electronically Signed   By: Rolm Baptise M.D.   On: 08/17/2017 11:10   Dg Chest Port 1 View  Result Date: 08/17/2017 CLINICAL DATA:  Shortness of breath.  Pulmonary edema. EXAM: PORTABLE CHEST 1 VIEW COMPARISON:  08/16/2017.  08/15/2017. FINDINGS: Cardiomegaly with bilateral pulmonary infiltrates/edema, partially cleared from prior exam. Small bilateral pleural effusions. No pneumothorax. No acute bony abnormality . IMPRESSION: Cardiomegaly with bilateral pulmonary infiltrates/edema, partially cleared from prior exams. Small bilateral pleural effusions again noted. Electronically  Signed   By: Marcello Moores  Register   On: 08/17/2017 10:17     ASSESSMENT AND PLAN:  82 year old male with history of diabetes, essential hypertension who presents with shortness of breath.  1. Acute hypoxic respiratory failure in the setting of new onset congestive heart failure/pulmonary edema Try to wean off of oxygen as tolerated  2. Acute diastolic congestive heart failure LV EF: 65% -   70%: Monitor intake and output and daily weight Continue Lasix CHF referral upon discharge  3. Diabetes: Continue sliding scale Hemoglobin A1c 6.9  4. Essential hypertension: Continue lasix, Norvasc, Coreg, isosorbide and lisinopril.  5. History of CAD: Continue Coreg, isosorbide, lisinopril, aspirin and statin 6. Acute renal failure on chronic kidney disease stage III: baseline creatinine of 1.8 Continue Lasix and lisinopril per Dr. Juleen China.  7. Elevated troponin: This is due to demand ischemia from CHF exacerbation. Patient has ruled out for ACS.  Generalized weakness.  PT evaluation. Management plans discussed with the patient, his wife and son and he is in agreement.  CODE STATUS: full  TOTAL TIME TAKING CARE OF THIS PATIENT: 33 minutes.     POSSIBLE D/C 1-2 days, DEPENDING ON CLINICAL CONDITION.   Bill Peterson M.D on 08/18/2017 at 2:41 PM  Between 7am to 6pm - Pager - [phone] After 6pm go to [email or web address] - password [email or web address] [facility] [place]  Office  [phone]  CC: Primary care physician; Bill Ruths, MD  Note: This dictation was prepared with Dragon dictation along with smaller phrase technology. Any transcriptional errors that result from this process are unintentional.

## 2017-08-18 NOTE — Progress Notes (Signed)
Name: Bill Peterson MRN: [id number] DOB: 29-Aug-1934     CONSULTATION DATE: 08/15/2017  Subjective: No major issues last night.    Objectives: Patient was placed on BiPAP for a few hours last night.  Prior to Admission medications   Medication Sig Start Date End Date Taking? Authorizing Provider  amLODipine (NORVASC) 10 MG tablet Take 10 mg by mouth daily.   Yes [provider]  Apple Cider Vinegar 300 MG TABS Take by mouth.   Yes [provider]  aspirin 325 MG tablet Take 325 mg by mouth daily.     Yes [provider]  atorvastatin (LIPITOR) 20 MG tablet Take 20 mg by mouth daily.   Yes [provider]  furosemide (LASIX) 20 MG tablet Take 20 mg by mouth.   Yes [provider]  glimepiride (AMARYL) 1 MG tablet Take 1 mg by mouth daily with breakfast.   Yes [provider]  lisinopril (PRINIVIL,ZESTRIL) 20 MG tablet Take 20 mg by mouth daily.   Yes [provider]  lisinopril-hydrochlorothiazide (PRINZIDE,ZESTORETIC) 20-12.5 MG tablet Take 1 tablet by mouth daily.   Yes [provider]  metFORMIN (GLUCOPHAGE) 1000 MG tablet Take 1,000 mg by mouth 2 (two) times daily with a meal.   Yes [provider]  Multiple Vitamin (MULTIVITAMIN) capsule Take 1 capsule by mouth daily.     Yes [provider]  pioglitazone (ACTOS) 30 MG tablet Take 30 mg by mouth daily.   Yes [provider]  Vitamins C E (VITAMIN C & E COMBINATION PO) Take by mouth.     Yes [provider]   Allergies  Allergen Reactions  . Heparin     REACTION: Unspecified  . Repaglinide Other (See Comments)    Increased appetite Increased appetite Increased appetite        VITAL SIGNS: Temp:  [98.3 F (36.8 C)-98.6 F (37 C)] 98.4 F (36.9 C) (12/22 0400) Pulse Rate:  [70-93] 89 (12/22 0600) Resp:  [12-25] 18 (12/22 0600) BP: (92-121)/(45-65) 121/65 (12/22 0600) SpO2:  [88 %-98 %] 95 % (12/22  0600)  Physical Examination:   -Awake and oriented with no acute neurological deficits. -Tolerating nasal cannula, no respiratory distress, able to talk in full sentences.  BEAE and no adventitious sounds. -S1 and S2 are audible and no murmur. -Benign abdominal exam was normal peristalsis. -Chronic skin changes both lower extremities and improvement pedal edema 1+.     Recent Labs  Lab 08/16/17 0901 08/17/17 1313 08/18/17 0500  NA 142 140 138  K 3.6 4.6 3.9  CL 104 101 102  CO2 25 32 28  BUN 27* 38* 41*  CREATININE 2.18* 2.22* 2.01*  GLUCOSE 214* 244* 158*   Recent Labs  Lab 08/15/17 1620 08/16/17 0901 08/18/17 0500  HGB 13.1 13.0 10.7*  HCT 39.8* 39.1* 31.4*  WBC 11.2* 10.4 5.5  PLT 321 265 181   US Renal  Result Date: 08/17/2017 CLINICAL DATA:  Acute kidney failure EXAM: RENAL / URINARY TRACT ULTRASOUND COMPLETE COMPARISON:  CT 05/20/2010 FINDINGS: Right Kidney: Length: 9.6 cm. 3 cm benign-appearing simple cyst in the upper pole. No hydronephrosis. Normal echotexture. Left Kidney: Length: 9.3 cm.  Normal echotexture.  No mass or hydronephrosis. Bladder: Appears normal for degree of bladder distention. IMPRESSION: No acute findings.  No hydronephrosis. Electronically Signed   By: Rolm Baptise M.D.   On: 08/17/2017 11:10   Dg Chest Port 1 View  Result Date: 08/17/2017 CLINICAL DATA:  Shortness  of breath.  Pulmonary edema. EXAM: PORTABLE CHEST 1 VIEW COMPARISON:  08/16/2017.  08/15/2017. FINDINGS: Cardiomegaly with bilateral pulmonary infiltrates/edema, partially cleared from prior exam. Small bilateral pleural effusions. No pneumothorax. No acute bony abnormality . IMPRESSION: Cardiomegaly with bilateral pulmonary infiltrates/edema, partially cleared from prior exams. Small bilateral pleural effusions again noted. Electronically Signed   By: Marcello Moores  Register   On: 08/17/2017 10:17    ASSESSMENT / PLAN:  -Acute respiratory failure (improved), tolerating nasal cannula  with no distress.  Has been off BiPAP all day long required few hours of PPV last night.  Monitor respiratory status and O2 sat.  -CHF with pulmonary edema and congestion (improved). History of coronary artery disease status post PCI on ASA + carvedilol + statins.  Optimize diuresis to improve lung compliance and management as per cardiology.   -AK I  ( improved )chronic kidney disease stage III.  Avoid nephrotoxins, monitor renal panel and urine output.  -Diabetes mellitus.  Optimize glycemic control.  -HTN.  Optimize antihypertensives and monitor hemodynamics.  -Anemia.  Monitor H&H and maintain hemoglobin more than 7 g/dL.  -Hematuria (resolved).  -Full code.  Patient was updated about his status and he agreed to the plan of care.  Critical care time 30 minutes

## 2017-08-18 NOTE — Progress Notes (Signed)
Pt refuses cpap tonight c/o top of nose sore from mask

## 2017-08-18 NOTE — Consult Note (Signed)
Reason for Consult: Congestive heart failure dyspnea Referring Physician: Dr. Tressia Miners hospitalist Dr. Frazier Richards primary  Bill Peterson is an 82 y.o. male.  HPI: Patient has a known history of coronary disease include myocardial infarction PCI and stent in the distant past patient has not followed up with cardiology in over 10 years.  Patient has diabetes hypertension hyperlipidemia asthma shortness of breath mild obesity who started having worsening heart failure symptoms in the past 1-2 months.  The patient started having dyspnea PND orthopnea and had increased doses of diuretics which seemed to help somewhat but he got progressively worse and presented to the emergency room in respiratory distress placed on BiPAP CPAP for hypoxemia given supplemental oxygen and has had significant improvement since.  Patient denied any significant chest pain no blackout spells or syncope has been compliant with his medication has never had a workup for obstructive sleep apnea.  Patient now feeling much better shortness of breath is much improved he has significant renal insufficiency and is followed by nephrology diabetes has been reasonably controlled still fairly active phase reasonably well now.  Past Medical History:  Diagnosis Date  . Diabetes mellitus without complication ([place])   . Hyperlipidemia   . Hypertension     Past Surgical History:  Procedure Laterality Date  . BACK SURGERY    . CARDIAC SURGERY    . STOMACH SURGERY      Family History  Problem Relation Age of Onset  . Heart disease Mother   . Diabetes Father   . Prostate cancer Neg Hx   . Kidney cancer Neg Hx   . Bladder Cancer Neg Hx     Social History:  reports that  has never smoked. he has never used smokeless tobacco. He reports that he does not drink alcohol or use drugs.  Allergies:  Allergies  Allergen Reactions  . Heparin     REACTION: Unspecified  . Repaglinide Other (See Comments)    Increased  appetite Increased appetite Increased appetite     Medications: See previous medication list  Results for orders placed or performed during the hospital encounter of 08/15/17 (from the past 48 hour(s))  Troponin I     Status: Abnormal   Collection Time: 08/16/17  9:01 AM  Result Value Ref Range   Troponin I 0.08 (HH) <0.03 ng/mL    Comment: CRITICAL RESULT CALLED TO, READ BACK BY AND VERIFIED WITH CHARLIE FLEETWOOD AT 0102 ON 08/16/17 [place].   Basic metabolic panel     Status: Abnormal   Collection Time: 08/16/17  9:01 AM  Result Value Ref Range   Sodium 142 135 - 145 mmol/L   Potassium 3.6 3.5 - 5.1 mmol/L   Chloride 104 101 - 111 mmol/L   CO2 25 22 - 32 mmol/L   Glucose, Bld 214 (H) 65 - 99 mg/dL   BUN 27 (H) 6 - 20 mg/dL   Creatinine, Ser 2.18 (H) 0.61 - 1.24 mg/dL   Calcium 9.3 8.9 - 10.3 mg/dL   GFR calc non Af Amer 27 (L) >60 mL/min   GFR calc Af Amer 31 (L) >60 mL/min    Comment: (NOTE) The eGFR has been calculated using the CKD EPI equation. This calculation has not been validated in all clinical situations. eGFR's persistently <60 mL/min signify possible Chronic Kidney Disease.    Anion gap 13 5 - 15  CBC     Status: Abnormal   Collection Time: 08/16/17  9:01 AM  Result Value Ref Range  WBC 10.4 3.8 - 10.6 K/uL   RBC 4.19 (L) 4.40 - 5.90 MIL/uL   Hemoglobin 13.0 13.0 - 18.0 g/dL   HCT 39.1 (L) 40.0 - 52.0 %   MCV 93.4 80.0 - 100.0 fL   MCH 31.1 26.0 - 34.0 pg   MCHC 33.3 32.0 - 36.0 g/dL   RDW 15.0 (H) 11.5 - 14.5 %   Platelets 265 150 - 440 K/uL  Glucose, capillary     Status: Abnormal   Collection Time: 08/16/17 11:40 AM  Result Value Ref Range   Glucose-Capillary 253 (H) 65 - 99 mg/dL  Glucose, capillary     Status: Abnormal   Collection Time: 08/16/17  4:25 PM  Result Value Ref Range   Glucose-Capillary 147 (H) 65 - 99 mg/dL  Glucose, capillary     Status: None   Collection Time: 08/16/17 10:15 PM  Result Value Ref Range   Glucose-Capillary 65 65  - 99 mg/dL  Glucose, capillary     Status: Abnormal   Collection Time: 08/17/17  6:07 AM  Result Value Ref Range   Glucose-Capillary 121 (H) 65 - 99 mg/dL  Glucose, capillary     Status: Abnormal   Collection Time: 08/17/17  7:30 AM  Result Value Ref Range   Glucose-Capillary 117 (H) 65 - 99 mg/dL  Glucose, capillary     Status: Abnormal   Collection Time: 08/17/17 12:08 PM  Result Value Ref Range   Glucose-Capillary 212 (H) 65 - 99 mg/dL  Basic metabolic panel     Status: Abnormal   Collection Time: 08/17/17  1:13 PM  Result Value Ref Range   Sodium 140 135 - 145 mmol/L   Potassium 4.6 3.5 - 5.1 mmol/L   Chloride 101 101 - 111 mmol/L   CO2 32 22 - 32 mmol/L   Glucose, Bld 244 (H) 65 - 99 mg/dL   BUN 38 (H) 6 - 20 mg/dL   Creatinine, Ser 2.22 (H) 0.61 - 1.24 mg/dL   Calcium 8.9 8.9 - 10.3 mg/dL   GFR calc non Af Amer 26 (L) >60 mL/min   GFR calc Af Amer 30 (L) >60 mL/min    Comment: (NOTE) The eGFR has been calculated using the CKD EPI equation. This calculation has not been validated in all clinical situations. eGFR's persistently <60 mL/min signify possible Chronic Kidney Disease.    Anion gap 7 5 - 15    Comment: Performed at [facility], [address] [place], [place] [address]  Glucose, capillary     Status: Abnormal   Collection Time: 08/17/17  4:30 PM  Result Value Ref Range   Glucose-Capillary 148 (H) 65 - 99 mg/dL  Glucose, capillary     Status: Abnormal   Collection Time: 08/17/17  9:05 PM  Result Value Ref Range   Glucose-Capillary 59 (L) 65 - 99 mg/dL  Glucose, capillary     Status: None   Collection Time: 08/17/17  9:31 PM  Result Value Ref Range   Glucose-Capillary 84 65 - 99 mg/dL  Uric acid     Status: Abnormal   Collection Time: 08/18/17  5:00 AM  Result Value Ref Range   Uric Acid, Serum 9.6 (H) 4.4 - 7.6 mg/dL    Comment: Performed at [facility], [address] [place], [place] [address]  Renal function panel     Status:  Abnormal   Collection Time: 08/18/17  5:00 AM  Result Value Ref Range   Sodium 138 135 - 145  mmol/L   Potassium 3.9 3.5 - 5.1 mmol/L   Chloride 102 101 - 111 mmol/L   CO2 28 22 - 32 mmol/L   Glucose, Bld 158 (H) 65 - 99 mg/dL   BUN 41 (H) 6 - 20 mg/dL   Creatinine, Ser 2.01 (H) 0.61 - 1.24 mg/dL   Calcium 8.8 (L) 8.9 - 10.3 mg/dL   Phosphorus 3.6 2.5 - 4.6 mg/dL   Albumin 3.0 (L) 3.5 - 5.0 g/dL   GFR calc non Af Amer 30 (L) >60 mL/min   GFR calc Af Amer 34 (L) >60 mL/min    Comment: (NOTE) The eGFR has been calculated using the CKD EPI equation. This calculation has not been validated in all clinical situations. eGFR's persistently <60 mL/min signify possible Chronic Kidney Disease.    Anion gap 8 5 - 15    Comment: Performed at [facility], [address] [place], [place] [address]  CBC with Differential/Platelet     Status: Abnormal   Collection Time: 08/18/17  5:00 AM  Result Value Ref Range   WBC 5.5 3.8 - 10.6 K/uL   RBC 3.38 (L) 4.40 - 5.90 MIL/uL   Hemoglobin 10.7 (L) 13.0 - 18.0 g/dL   HCT 31.4 (L) 40.0 - 52.0 %   MCV 92.9 80.0 - 100.0 fL   MCH 31.5 26.0 - 34.0 pg   MCHC 33.9 32.0 - 36.0 g/dL   RDW 14.8 (H) 11.5 - 14.5 %   Platelets 181 150 - 440 K/uL   Neutrophils Relative % 69 %   Neutro Abs 3.9 1.4 - 6.5 K/uL   Lymphocytes Relative 15 %   Lymphs Abs 0.8 (L) 1.0 - 3.6 K/uL   Monocytes Relative 13 %   Monocytes Absolute 0.7 0.2 - 1.0 K/uL   Eosinophils Relative 2 %   Eosinophils Absolute 0.1 0 - 0.7 K/uL   Basophils Relative 1 %   Basophils Absolute 0.0 0 - 0.1 K/uL    Comment: Performed at [facility], [address], [place], [place] [address]  Magnesium     Status: None   Collection Time: 08/18/17  5:00 AM  Result Value Ref Range   Magnesium 1.9 1.7 - 2.4 mg/dL    Comment: Performed at [facility], [address] [place], [place] [address]  Phosphorus     Status: None   Collection Time: 08/18/17  5:00 AM  Result Value  Ref Range   Phosphorus 3.7 2.5 - 4.6 mg/dL    Comment: Performed at [facility], [address] [place], [place] [address]  Glucose, capillary     Status: Abnormal   Collection Time: 08/18/17  7:07 AM  Result Value Ref Range   Glucose-Capillary 174 (H) 65 - 99 mg/dL    US Renal  Result Date: 08/17/2017 CLINICAL DATA:  Acute kidney failure EXAM: RENAL / URINARY TRACT ULTRASOUND COMPLETE COMPARISON:  CT 05/20/2010 FINDINGS: Right Kidney: Length: 9.6 cm. 3 cm benign-appearing simple cyst in the upper pole. No hydronephrosis. Normal echotexture. Left Kidney: Length: 9.3 cm.  Normal echotexture.  No mass or hydronephrosis. Bladder: Appears normal for degree of bladder distention. IMPRESSION: No acute findings.  No hydronephrosis. Electronically Signed   By: Rolm Baptise M.D.   On: 08/17/2017 11:10   Dg Chest Port 1 View  Result Date: 08/17/2017 CLINICAL DATA:  Shortness of breath.  Pulmonary edema. EXAM: PORTABLE CHEST 1 VIEW COMPARISON:  08/16/2017.  08/15/2017. FINDINGS: Cardiomegaly with bilateral pulmonary infiltrates/edema, partially cleared from  prior exam. Small bilateral pleural effusions. No pneumothorax. No acute bony abnormality . IMPRESSION: Cardiomegaly with bilateral pulmonary infiltrates/edema, partially cleared from prior exams. Small bilateral pleural effusions again noted. Electronically Signed   By: Marcello Moores  Register   On: 08/17/2017 10:17    Review of Systems  Constitutional: Positive for diaphoresis and malaise/fatigue.  HENT: Positive for congestion.   Eyes: Negative.   Respiratory: Positive for cough and shortness of breath.   Cardiovascular: Positive for orthopnea, leg swelling and PND.  Gastrointestinal: Negative.   Genitourinary: Negative.   Musculoskeletal: Negative.   Skin: Negative.   Neurological: Positive for weakness.  Endo/Heme/Allergies: Negative.   Psychiatric/Behavioral: Negative.    Blood pressure 121/65, pulse 89, temperature 98.4 F (36.9  C), temperature source Oral, resp. rate 18, height _0  (1.676 m), weight 201 lb 15.1 oz (91.6 kg), SpO2 95 %. Physical Exam  Nursing note and vitals reviewed. Constitutional: He is oriented to person, place, and time. He appears well-developed and well-nourished.  HENT:  Head: Normocephalic and atraumatic.  Eyes: Conjunctivae and EOM are normal. Pupils are equal, round, and reactive to light.  Neck: Normal range of motion. Neck supple.  Cardiovascular: Normal rate and regular rhythm.  Murmur heard. Respiratory: Effort normal. He has decreased breath sounds. He has rhonchi.  GI: Soft. Bowel sounds are normal.  Musculoskeletal: Normal range of motion. He exhibits edema.  Neurological: He is alert and oriented to person, place, and time. He has normal reflexes.  Skin: Skin is warm and dry.  Psychiatric: He has a normal mood and affect.    Assessment/Plan: Congestive heart failure acute on chronic  Shortness of breath Dyspnea Hypertension Diabetes Renal insufficiency acute on chronic stage 3 Hyperlipidemia Murmur Possible obstructive sleep apnea Known coronary disease History of myocardial infarction with PCI and stent Diastolic dysfunction Aortic valve stenosis mild to moderate Borderline troponins probably demand ischemia . PLAN Agree with admit for respiratory support Continue supplemental oxygen as needed Agree with inhalers for shortness of breath Continue diuretic therapy Continue imdur because of headaches and no clear evidence of angina or cp  Agree with nephrology involvement for renal insufficiency Maintain diabetes management and control Recommend sleep study and possible CPAP if indicated modest weight loss Recommend functional study for assessment of possible ischemia Peterson be done as an outpatient Consider switching amlodipine for diltiazem to help with diastolic dysfunction Consider Entresto for heart failure but be mindful of renal insufficiency Agree with  lipid management Increase activity with ambulation Consider discharge soon with follow-up with cardiology in 1-2 weeks Recommend continue aspirin therapy for long-term anticoagulation   Guhan Bruington D Tarissa Kerin 08/18/2017, 8:18 AM

## 2017-08-18 NOTE — Progress Notes (Signed)
Foley catheter removed, patient tolerated well. Educated to use urinal and call for assistance. Will continue to monitor.

## 2017-08-18 NOTE — Progress Notes (Signed)
[facility]  ROUNDING NOTE   Subjective:   Sitting up in bed. Family at bedside.   UOP 3700  [place] 2 L O2  Creatinine 2.01 (2.22)  Objective:  Vital signs in last 24 hours:  Temp:  [98.3 F (36.8 C)-98.6 F (37 C)] 98.4 F (36.9 C) (12/22 0400) Pulse Rate:  [70-93] 89 (12/22 0600) Resp:  [12-25] 18 (12/22 0600) BP: (92-121)/(45-65) 121/65 (12/22 0600) SpO2:  [88 %-98 %] 95 % (12/22 0600)  Weight change:  Filed Weights   08/15/17 2000 08/16/17 2200  Weight: 90 kg (198 lb 6.6 oz) 91.6 kg (201 lb 15.1 oz)    Intake/Output: I/O last 3 completed shifts: In: -  Out: 4400 [Urine:4400]   Intake/Output this shift:  No intake/output data recorded.  Physical Exam: General: NAD, sitting in bed  Head: Normocephalic, atraumatic. Moist oral mucosal membranes  Eyes: Anicteric, PERRL  Neck: Supple, trachea midline  Lungs:  Clear to auscultation  Heart: Regular rate and rhythm  Abdomen:  Soft, nontender,   Extremities:  trace  peripheral edema.  Neurologic: Nonfocal, moving all four extremities  Skin: No lesions       Basic Metabolic Panel: Recent Labs  Lab 08/15/17 1620 08/16/17 0901 08/17/17 1313 08/18/17 0500  NA 139 142 140 138  K 3.7 3.6 4.6 3.9  CL 102 104 101 102  CO2 27 25 32 28  GLUCOSE 172* 214* 244* 158*  BUN 22* 27* 38* 41*  CREATININE 1.84* 2.18* 2.22* 2.01*  CALCIUM 9.2 9.3 8.9 8.8*  MG  --   --   --  1.9  PHOS  --   --   --  3.6  3.7    Liver Function Tests: Recent Labs  Lab 08/18/17 0500  ALBUMIN 3.0*   No results for input(s): LIPASE, AMYLASE in the last 168 hours. No results for input(s): AMMONIA in the last 168 hours.  CBC: Recent Labs  Lab 08/15/17 1620 08/16/17 0901 08/18/17 0500  WBC 11.2* 10.4 5.5  NEUTROABS 5.3  --  3.9  HGB 13.1 13.0 10.7*  HCT 39.8* 39.1* 31.4*  MCV 94.5 93.4 92.9  PLT 321 265 181    Cardiac Enzymes: Recent Labs  Lab 08/15/17 1620 08/15/17 2012 08/16/17 0244 08/16/17 0901   TROPONINI <0.03 0.04* 0.04* 0.08*    BNP: Invalid input(s): POCBNP  CBG: Recent Labs  Lab 08/17/17 1208 08/17/17 1630 08/17/17 2105 08/17/17 2131 08/18/17 0707  GLUCAP 212* 148* 66* 42 174*    Microbiology: Results for orders placed or performed during the hospital encounter of 08/15/17  MRSA PCR Screening     Status: None   Collection Time: 08/15/17  7:58 PM  Result Value Ref Range Status   MRSA by PCR NEGATIVE NEGATIVE Final    Comment:        The GeneXpert MRSA Assay (FDA approved for NASAL specimens only), is one component of a comprehensive MRSA colonization surveillance program. It is not intended to diagnose MRSA infection nor to guide or monitor treatment for MRSA infections.     Coagulation Studies: No results for input(s): LABPROT, INR in the last 72 hours.  Urinalysis: No results for input(s): COLORURINE, LABSPEC, PHURINE, GLUCOSEU, HGBUR, BILIRUBINUR, KETONESUR, PROTEINUR, UROBILINOGEN, NITRITE, LEUKOCYTESUR in the last 72 hours.  Invalid input(s): APPERANCEUR    Imaging: US Renal  Result Date: 08/17/2017 CLINICAL DATA:  Acute kidney failure EXAM: RENAL / URINARY TRACT ULTRASOUND COMPLETE COMPARISON:  CT 05/20/2010 FINDINGS: Right Kidney: Length: 9.6 cm. 3 cm  benign-appearing simple cyst in the upper pole. No hydronephrosis. Normal echotexture. Left Kidney: Length: 9.3 cm.  Normal echotexture.  No mass or hydronephrosis. Bladder: Appears normal for degree of bladder distention. IMPRESSION: No acute findings.  No hydronephrosis. Electronically Signed   By: Rolm Baptise M.D.   On: 08/17/2017 11:10   Dg Chest Port 1 View  Result Date: 08/17/2017 CLINICAL DATA:  Shortness of breath.  Pulmonary edema. EXAM: PORTABLE CHEST 1 VIEW COMPARISON:  08/16/2017.  08/15/2017. FINDINGS: Cardiomegaly with bilateral pulmonary infiltrates/edema, partially cleared from prior exam. Small bilateral pleural effusions. No pneumothorax. No acute bony abnormality .  IMPRESSION: Cardiomegaly with bilateral pulmonary infiltrates/edema, partially cleared from prior exams. Small bilateral pleural effusions again noted. Electronically Signed   By: Marcello Moores  Register   On: 08/17/2017 10:17     Medications:    . amLODipine  10 mg Oral Daily  . aspirin  325 mg Oral Daily  . atorvastatin  20 mg Oral Daily  . carvedilol  6.25 mg Oral BID WC  . furosemide  40 mg Intravenous Q12H  . insulin aspart  0-5 Units Subcutaneous QHS  . insulin aspart  0-9 Units Subcutaneous TID WC  . insulin aspart  3 Units Subcutaneous TID WC  . isosorbide mononitrate  30 mg Oral Daily  . lisinopril  20 mg Oral Daily  . multivitamin with minerals  1 tablet Oral Daily   acetaminophen **OR** acetaminophen, hydrALAZINE, ipratropium-albuterol, ondansetron **OR** ondansetron (ZOFRAN) IV  Assessment/ Plan:  Mr. Bill Peterson is a 82 y.o. black male with diabetes mellitus type II noninsulin dependent, hypertension, coronary artery disease, BPH, history of hematuria , who was admitted to [facility] on 08/15/2017 for acute exacerbation of diastolic congestive heart failure new onset.   1. Acute renal failure on chronic kidney disease stage III: baseline creatinine of 1.8, GFR of 37 on 07/30/17.  Chronic kidney disease most likely secondary to diabetes and hypertension. No proteinuria Acute renal failure from acute cardiorenal syndrome most likely Ultrasound reviewed with patient.  - Check urine studies - Continue furosemide - Continue lisinopril  2. Hypertension with acute diastolic congestive heart failure: echocardiogram reviewed with patient.  - IV furosemide - transition to PO furosemide - amlodipine, carvedilol, lisinopril, isosorbide mononitrate  3. Diabetes mellitus type II with chronic kidney disease: noninsulin dependent. On metformin as outpatient. Holding metformin.  Hemoglobin A1c of 6.9%  4. Hematuria: history of prostate biopsy and negative urology work up    LOS:  [place], [place] 12/22/20187:55 AM

## 2017-08-18 NOTE — Evaluation (Signed)
Physical Therapy Evaluation Patient Details Name: QUINDARIUS CABELLO MRN: [id number] DOB: Jul 15, 1935 Today's Date: 08/18/2017   History of Present Illness  Pt admitted for pulm edema. Pt with complaints of SOB symptoms. History includes HTN, DM. Slight increased troponin, however cleared to work with therapy per cardio. Currently on 2L of O2  Clinical Impression  Pt is a pleasant 82 year old male who was admitted for pulm edema. Pt performs transfers with mod I and ambulation with supervision and RW. Further ambulation performed without AD as is his baseline. All mobility performed on 2L of O2 without SOB symptoms. Sats WNL. Encouraged pt to continue ambulation with RN staff, monitoring O2 sats. Does not require AD at this time and gait will improve with increased OOB mobility. Pt is currently at baseline level. Pt does not require any further PT needs at this time. Pt will be dc in house and does not require follow up. RN aware. Will dc current orders.     Follow Up Recommendations No PT follow up    Equipment Recommendations  None recommended by PT    Recommendations for Other Services       Precautions / Restrictions Precautions Precautions: Fall Restrictions Weight Bearing Restrictions: No      Mobility  Bed Mobility               General bed mobility comments: not performed as received in recliner  Transfers Overall transfer level: Modified independent Equipment used: Rolling walker (2 wheeled)             General transfer comment: safe technique performed with upright posture.  Ambulation/Gait Ambulation/Gait assistance: Supervision Ambulation Distance (Feet): 225 Feet Assistive device: Rolling walker (2 wheeled) Gait Pattern/deviations: Step-through pattern     General Gait Details: ambulated with slight unsteadiness initially, however improved with practice. Safe technique with reciprocal gait performed. Further ambulation performed without RW for 31' with  cautious speed, however no over LOB.   Stairs            Wheelchair Mobility    Modified Rankin (Stroke Patients Only)       Balance Overall balance assessment: Needs assistance Sitting-balance support: Feet supported Sitting balance-Leahy Scale: Normal     Standing balance support: Bilateral upper extremity supported Standing balance-Leahy Scale: Good                               Pertinent Vitals/Pain Pain Assessment: No/denies pain    Home Living Family/patient expects to be discharged to:: Private residence Living Arrangements: Spouse/significant other Available Help at Discharge: Family Type of Home: House Home Access: Level entry     Home Layout: One level Home Equipment: None      Prior Function Level of Independence: Independent         Comments: no falls, indep prior to admission     Hand Dominance        Extremity/Trunk Assessment   Upper Extremity Assessment Upper Extremity Assessment: Overall WFL for tasks assessed    Lower Extremity Assessment Lower Extremity Assessment: Overall WFL for tasks assessed       Communication   Communication: No difficulties  Cognition Arousal/Alertness: Awake/alert Behavior During Therapy: WFL for tasks assessed/performed Overall Cognitive Status: Within Functional Limits for tasks assessed  General Comments      Exercises Other Exercises Other Exercises: ambulated with/without RW with O2 donned. O2 sats on 2L of O2 at 98% pre and 96% post. No SOB symptoms noted   Assessment/Plan    PT Assessment Patient needs continued PT services;Patent does not need any further PT services  PT Problem List         PT Treatment Interventions      PT Goals (Current goals can be found in the Care Plan section)  Acute Rehab PT Goals Patient Stated Goal: to breathe better PT Goal Formulation: All assessment and education complete, DC  therapy Time For Goal Achievement: 08/18/17 Potential to Achieve Goals: Good    Frequency     Barriers to discharge        Co-evaluation               AM-PAC PT "6 Clicks" Daily Activity  Outcome Measure Difficulty turning over in bed (including adjusting bedclothes, sheets and blankets)?: None Difficulty moving from lying on back to sitting on the side of the bed? : None Difficulty sitting down on and standing up from a chair with arms (e.g., wheelchair, bedside commode, etc,.)?: None Help needed moving to and from a bed to chair (including a wheelchair)?: None Help needed walking in hospital room?: None Help needed climbing 3-5 steps with a railing? : None 6 Click Score: 24    End of Session Equipment Utilized During Treatment: Gait belt;Oxygen Activity Tolerance: Patient tolerated treatment well Patient left: in chair;with family/visitor present Nurse Communication: Mobility status PT Visit Diagnosis: Muscle weakness (generalized) (M62.81);Difficulty in walking, not elsewhere classified (R26.2)    Time: 8366-2947 PT Time Calculation (min) (ACUTE ONLY): 18 min   Charges:   PT Evaluation $PT Eval Low Complexity: 1 Low PT Treatments $Gait Training: 8-22 mins   PT G Codes:   PT G-Codes **NOT FOR INPATIENT CLASS** Functional Assessment Tool Used: AM-PAC 6 Clicks Basic Mobility Functional Limitation: Mobility: Walking and moving around Mobility: Walking and Moving Around Current Status (M5465): 0 percent impaired, limited or restricted Mobility: Walking and Moving Around Goal Status (K3546): 0 percent impaired, limited or restricted Mobility: Walking and Moving Around Discharge Status (F6812): 0 percent impaired, limited or restricted    Greggory Stallion, PT, DPT [phone]   Dariel Pellecchia 08/18/2017, 3:22 PM

## 2017-08-19 ENCOUNTER — Encounter: Payer: Self-pay | Admitting: Emergency Medicine

## 2017-08-19 LAB — PARATHYROID HORMONE, INTACT (NO CA): PTH: 37 pg/mL (ref 15–65)

## 2017-08-19 LAB — GLUCOSE, CAPILLARY: Glucose-Capillary: 123 mg/dL — ABNORMAL HIGH (ref 65–99)

## 2017-08-19 MED ORDER — TAMSULOSIN HCL 0.4 MG PO CAPS
0.4000 mg | ORAL_CAPSULE | Freq: Every day | ORAL | Status: DC
  Administered 2017-08-19: 0.4 mg via ORAL
  Filled 2017-08-19: qty 1

## 2017-08-19 MED ORDER — ISOSORBIDE MONONITRATE ER 30 MG PO TB24: 30.0000 mg | ORAL_TABLET | Freq: Every day | ORAL | 1 refills | Status: DC

## 2017-08-19 MED ORDER — FUROSEMIDE 40 MG PO TABS: 40.0000 mg | ORAL_TABLET | Freq: Two times a day (BID) | ORAL | 1 refills | Status: DC

## 2017-08-19 MED ORDER — CARVEDILOL 6.25 MG PO TABS: 6.2500 mg | ORAL_TABLET | Freq: Two times a day (BID) | ORAL | 1 refills | Status: DC

## 2017-08-19 MED ORDER — TAMSULOSIN HCL 0.4 MG PO CAPS: 0.4000 mg | ORAL_CAPSULE | Freq: Every day | ORAL | 0 refills | Status: DC

## 2017-08-19 NOTE — Discharge Summary (Signed)
[facility] [place] NAME: Bill Peterson    MR#:  [id number]  DATE OF BIRTH:  [birth year]  DATE OF ADMISSION:  08/15/2017   ADMITTING PHYSICIAN: Gladstone Lighter, MD  DATE OF DISCHARGE: 08/19/2017 11:30 AM  PRIMARY CARE PHYSICIAN: Kirk Ruths, MD   ADMISSION DIAGNOSIS:  Flash pulmonary edema ([place]) [J81.0] Hypoxia [R09.02] DISCHARGE DIAGNOSIS:  Active Problems:   Pulmonary edema  SECONDARY DIAGNOSIS:   Past Medical History:  Diagnosis Date  . Diabetes mellitus without complication ([place])   . Hyperlipidemia   . Hypertension    HOSPITAL COURSE:  82 year old male with history of diabetes, essential hypertension who presents with shortness of breath.  1. Acute hypoxic respiratory failure in the setting of new onset congestive heart failure/pulmonary edema Weaned off of oxygen.  2. Acute diastolic congestive heart failure LV EF: 65% - 70%: Monitor intake and output and daily weight Continue Lasix CHF referral upon discharge  3. Diabetes: Continue sliding scale Hemoglobin A1c 6.9  4. Essential hypertension: Continue lasix, Norvasc, Coreg, isosorbide and lisinopril.  5. History of CAD: Continue Coreg, isosorbide, lisinopril, aspirin and statin 6. Acute renal failure on chronic kidney disease stage III: baseline creatinine of 1.8 Continue Lasix and lisinopril per Dr. Juleen China.  7. Elevated troponin: This is due to demand ischemia from CHF exacerbation. Patient was ruled out for ACS.  Urine retention.  Start Flomax.  Keep Foley catheter for 5 days and follow-up urology clinic with Dr. Bernardo Heater per on-call urologist.  Discussed with Dr. on-call urologist. DISCHARGE CONDITIONS:  Stable, discharged to home today. CONSULTS OBTAINED:  Treatment Team:  Lavonia Dana, MD Yolonda Kida, MD DRUG ALLERGIES:   Allergies  Allergen Reactions  . Heparin     REACTION: Unspecified  . Repaglinide Other (See  Comments)    Increased appetite Increased appetite Increased appetite    DISCHARGE MEDICATIONS:   Allergies as of 08/19/2017      Reactions   Heparin    REACTION: Unspecified   Repaglinide Other (See Comments)   Increased appetite Increased appetite Increased appetite      Medication List    STOP taking these medications   lisinopril-hydrochlorothiazide 20-12.5 MG tablet Commonly known as:  PRINZIDE,ZESTORETIC   metFORMIN 1000 MG tablet Commonly known as:  GLUCOPHAGE     TAKE these medications   amLODipine 10 MG tablet Commonly known as:  NORVASC Take 10 mg by mouth daily.   Apple Cider Vinegar 300 MG Tabs Take by mouth.   aspirin 325 MG tablet Take 325 mg by mouth daily.   atorvastatin 20 MG tablet Commonly known as:  LIPITOR Take 20 mg by mouth daily.   carvedilol 6.25 MG tablet Commonly known as:  COREG Take 1 tablet (6.25 mg total) by mouth 2 (two) times daily with a meal.   furosemide 40 MG tablet Commonly known as:  LASIX Take 1 tablet (40 mg total) by mouth 2 (two) times daily. What changed:    medication strength  how much to take  when to take this   glimepiride 1 MG tablet Commonly known as:  AMARYL Take 1 mg by mouth daily with breakfast.   isosorbide mononitrate 30 MG 24 hr tablet Commonly known as:  IMDUR Take 1 tablet (30 mg total) by mouth daily.   lisinopril 20 MG tablet Commonly known as:  PRINIVIL,ZESTRIL Take 20 mg by mouth daily.   multivitamin capsule Take 1 capsule by mouth daily.   pioglitazone 30  MG tablet Commonly known as:  ACTOS Take 30 mg by mouth daily.   tamsulosin 0.4 MG Caps capsule Commonly known as:  FLOMAX Take 1 capsule (0.4 mg total) by mouth daily.   VITAMIN C & E COMBINATION PO Take by mouth.        DISCHARGE INSTRUCTIONS:  See AVS.  If you experience worsening of your admission symptoms, develop shortness of breath, life threatening emergency, suicidal or homicidal thoughts you must seek  medical attention immediately by calling 911 or calling your MD immediately  if symptoms less severe.  You Must read complete instructions/literature along with all the possible adverse reactions/side effects for all the Medicines you take and that have been prescribed to you. Take any new Medicines after you have completely understood and accpet all the possible adverse reactions/side effects.   Please note  You were cared for by a hospitalist during your hospital stay. If you have any questions about your discharge medications or the care you received while you were in the hospital after you are discharged, you can call the unit and asked to speak with the hospitalist on call if the hospitalist that took care of you is not available. Once you are discharged, your primary care physician will handle any further medical issues. Please note that NO REFILLS for any discharge medications will be authorized once you are discharged, as it is imperative that you return to your primary care physician (or establish a relationship with a primary care physician if you do not have one) for your aftercare needs so that they can reassess your need for medications and monitor your lab values.    On the day of Discharge:  VITAL SIGNS:  Blood pressure (!) 124/51, pulse 85, temperature 98.4 F (36.9 C), temperature source Oral, resp. rate 19, height 5\' 6"  (1.676 m), weight 182 lb 12.8 oz (82.9 kg), SpO2 100 %. PHYSICAL EXAMINATION:  GENERAL:  82 y.o.-year-old patient lying in the bed with no acute distress.  EYES: Pupils equal, round, reactive to light and accommodation. No scleral icterus. Extraocular muscles intact.  HEENT: Head atraumatic, normocephalic. Oropharynx and nasopharynx clear.  NECK:  Supple, no jugular venous distention. No thyroid enlargement, no tenderness.  LUNGS: Normal breath sounds bilaterally, no wheezing, rales,rhonchi or crepitation. No use of accessory muscles of respiration.    CARDIOVASCULAR: S1, S2 normal. No murmurs, rubs, or gallops.  ABDOMEN: Soft, non-tender, non-distended. Bowel sounds present. No organomegaly or mass.  EXTREMITIES: No cyanosis, or clubbing.  Bilateral leg edema 1+. NEUROLOGIC: Cranial nerves II through XII are intact. Muscle strength 5/5 in all extremities. Sensation intact. Gait not checked.  PSYCHIATRIC: The patient is alert and oriented x 3.  SKIN: No obvious rash, lesion, or ulcer.  DATA REVIEW:   CBC Recent Labs  Lab 08/18/17 0500  WBC 5.5  HGB 10.7*  HCT 31.4*  PLT 181    Chemistries  Recent Labs  Lab 08/18/17 0500  NA 138  K 3.9  CL 102  CO2 28  GLUCOSE 158*  BUN 41*  CREATININE 2.01*  CALCIUM 8.8*  MG 1.9     Microbiology Results  Results for orders placed or performed during the hospital encounter of 08/15/17  MRSA PCR Screening     Status: None   Collection Time: 08/15/17  7:58 PM  Result Value Ref Range Status   MRSA by PCR NEGATIVE NEGATIVE Final    Comment:        The GeneXpert MRSA Assay (FDA approved for  NASAL specimens only), is one component of a comprehensive MRSA colonization surveillance program. It is not intended to diagnose MRSA infection nor to guide or monitor treatment for MRSA infections.     RADIOLOGY:  No results found.   Management plans discussed with the patient, his wife and son and they are in agreement.  CODE STATUS: Full Code   TOTAL TIME TAKING CARE OF THIS PATIENT: 38 minutes.    [name] M.D on 08/19/2017 at 2:02 PM  Between 7am to 6pm - Pager - [phone]  After 6pm go to [email or web address] - [employer] [facility]  Office  [phone]  CC: Primary care physician; Kirk Ruths, MD   Note: This dictation was prepared with Dragon dictation along with smaller phrase technology. Any transcriptional errors that result from this process are unintentional.

## 2017-08-19 NOTE — Discharge Planning (Signed)
Discharge instructions reviewed with pt. Pt verbalizes understanding. Foley teaching provided to pt. Pt ready for discharge home.

## 2017-08-19 NOTE — Discharge Instructions (Signed)
Heart healthy and ADA diet. Keep Foley for 5 days until see Dr. Bernardo Heater.

## 2017-08-20 LAB — KAPPA/LAMBDA LIGHT CHAINS
Kappa free light chain: 56.5 mg/L — ABNORMAL HIGH (ref 3.3–19.4)
Kappa, lambda light chain ratio: 2.28 — ABNORMAL HIGH (ref 0.26–1.65)
LAMDA FREE LIGHT CHAINS: 24.8 mg/L (ref 5.7–26.3)

## 2017-08-24 ENCOUNTER — Telehealth: Payer: Self-pay

## 2017-08-24 LAB — PROTEIN ELECTROPHORESIS, SERUM
A/G Ratio: 0.8 (ref 0.7–1.7)
ALPHA-1-GLOBULIN: 0.4 g/dL (ref 0.0–0.4)
ALPHA-2-GLOBULIN: 0.8 g/dL (ref 0.4–1.0)
Albumin ELP: 2.6 g/dL — ABNORMAL LOW (ref 2.9–4.4)
BETA GLOBULIN: 1.1 g/dL (ref 0.7–1.3)
GAMMA GLOBULIN: 0.9 g/dL (ref 0.4–1.8)
Globulin, Total: 3.1 g/dL (ref 2.2–3.9)
Total Protein ELP: 5.7 g/dL — ABNORMAL LOW (ref 6.0–8.5)

## 2017-08-24 NOTE — Telephone Encounter (Signed)
LM to contact clinic for appointment

## 2017-08-24 NOTE — Telephone Encounter (Signed)
-----   Message from Alisa Graff, [place] sent at 08/19/2017  5:34 PM EST ----- Regarding: Please call to schedule appointment Contact: [phone] D/c'd 12/19

## 2017-09-05 ENCOUNTER — Encounter: Payer: Self-pay | Admitting: Urology

## 2017-09-05 ENCOUNTER — Ambulatory Visit (INDEPENDENT_AMBULATORY_CARE_PROVIDER_SITE_OTHER): Payer: Medicare Other | Admitting: Urology

## 2017-09-05 ENCOUNTER — Encounter: Payer: Self-pay | Admitting: Emergency Medicine

## 2017-09-05 ENCOUNTER — Other Ambulatory Visit: Payer: Self-pay

## 2017-09-05 ENCOUNTER — Inpatient Hospital Stay
Admission: EM | Admit: 2017-09-05 | Discharge: 2017-09-07 | DRG: 291 | Disposition: A | Discharge status: Home / Self Care | Payer: Medicare Other | Attending: Family Medicine | Admitting: Family Medicine

## 2017-09-05 ENCOUNTER — Emergency Department: Payer: Medicare Other

## 2017-09-05 VITALS — BP 127/65 | HR 99 | Ht 66.0 in | Wt 202.0 lb

## 2017-09-05 DIAGNOSIS — I340 Nonrheumatic mitral (valve) insufficiency: Secondary | ICD-10-CM | POA: Diagnosis present

## 2017-09-05 DIAGNOSIS — N183 Chronic kidney disease, stage 3 (moderate): Secondary | ICD-10-CM | POA: Diagnosis present

## 2017-09-05 DIAGNOSIS — I130 Hypertensive heart and chronic kidney disease with heart failure and stage 1 through stage 4 chronic kidney disease, or unspecified chronic kidney disease: Secondary | ICD-10-CM | POA: Diagnosis present

## 2017-09-05 DIAGNOSIS — J9601 Acute respiratory failure with hypoxia: Secondary | ICD-10-CM | POA: Diagnosis present

## 2017-09-05 DIAGNOSIS — N401 Enlarged prostate with lower urinary tract symptoms: Secondary | ICD-10-CM | POA: Diagnosis present

## 2017-09-05 DIAGNOSIS — R338 Other retention of urine: Secondary | ICD-10-CM | POA: Diagnosis present

## 2017-09-05 DIAGNOSIS — I5033 Acute on chronic diastolic (congestive) heart failure: Secondary | ICD-10-CM | POA: Diagnosis present

## 2017-09-05 DIAGNOSIS — E785 Hyperlipidemia, unspecified: Secondary | ICD-10-CM | POA: Diagnosis present

## 2017-09-05 DIAGNOSIS — Z79899 Other long term (current) drug therapy: Secondary | ICD-10-CM

## 2017-09-05 DIAGNOSIS — I5031 Acute diastolic (congestive) heart failure: Secondary | ICD-10-CM

## 2017-09-05 DIAGNOSIS — Z7984 Long term (current) use of oral hypoglycemic drugs: Secondary | ICD-10-CM | POA: Diagnosis not present

## 2017-09-05 DIAGNOSIS — R339 Retention of urine, unspecified: Secondary | ICD-10-CM

## 2017-09-05 DIAGNOSIS — J9600 Acute respiratory failure, unspecified whether with hypoxia or hypercapnia: Secondary | ICD-10-CM | POA: Diagnosis present

## 2017-09-05 DIAGNOSIS — I509 Heart failure, unspecified: Secondary | ICD-10-CM

## 2017-09-05 DIAGNOSIS — R0902 Hypoxemia: Secondary | ICD-10-CM

## 2017-09-05 DIAGNOSIS — I252 Old myocardial infarction: Secondary | ICD-10-CM

## 2017-09-05 DIAGNOSIS — Z7982 Long term (current) use of aspirin: Secondary | ICD-10-CM

## 2017-09-05 DIAGNOSIS — I2510 Atherosclerotic heart disease of native coronary artery without angina pectoris: Secondary | ICD-10-CM | POA: Diagnosis present

## 2017-09-05 DIAGNOSIS — R972 Elevated prostate specific antigen [PSA]: Secondary | ICD-10-CM

## 2017-09-05 DIAGNOSIS — Z888 Allergy status to other drugs, medicaments and biological substances status: Secondary | ICD-10-CM | POA: Diagnosis not present

## 2017-09-05 DIAGNOSIS — E1122 Type 2 diabetes mellitus with diabetic chronic kidney disease: Secondary | ICD-10-CM | POA: Diagnosis present

## 2017-09-05 HISTORY — DX: Heart failure, unspecified: I50.9

## 2017-09-05 HISTORY — DX: Benign prostatic hyperplasia without lower urinary tract symptoms: N40.0

## 2017-09-05 LAB — BASIC METABOLIC PANEL
Anion gap: 11 (ref 5–15)
BUN: 21 mg/dL — AB (ref 6–20)
CHLORIDE: 100 mmol/L — AB (ref 101–111)
CO2: 26 mmol/L (ref 22–32)
Calcium: 8.9 mg/dL (ref 8.9–10.3)
Creatinine, Ser: 1.87 mg/dL — ABNORMAL HIGH (ref 0.61–1.24)
GFR calc Af Amer: 37 mL/min — ABNORMAL LOW (ref >60)
GFR calc non Af Amer: 32 mL/min — ABNORMAL LOW (ref >60)
GLUCOSE: 158 mg/dL — AB (ref 65–99)
POTASSIUM: 3.6 mmol/L (ref 3.5–5.1)
Sodium: 137 mmol/L (ref 135–145)

## 2017-09-05 LAB — URINALYSIS, ROUTINE W REFLEX MICROSCOPIC
BILIRUBIN URINE: NEGATIVE
Glucose, UA: NEGATIVE mg/dL
Ketones, ur: NEGATIVE mg/dL
Leukocytes, UA: NEGATIVE
Nitrite: NEGATIVE
PROTEIN: NEGATIVE mg/dL
Specific Gravity, Urine: 1.003 — ABNORMAL LOW (ref 1.005–1.030)
Squamous Epithelial / LPF: NONE SEEN
pH: 7 (ref 5.0–8.0)

## 2017-09-05 LAB — CBC
HEMATOCRIT: 36.9 % — AB (ref 40.0–52.0)
Hemoglobin: 12.3 g/dL — ABNORMAL LOW (ref 13.0–18.0)
MCH: 31.4 pg (ref 26.0–34.0)
MCHC: 33.2 g/dL (ref 32.0–36.0)
MCV: 94.4 fL (ref 80.0–100.0)
Platelets: 231 10*3/uL (ref 150–440)
RBC: 3.91 MIL/uL — ABNORMAL LOW (ref 4.40–5.90)
RDW: 15.2 % — AB (ref 11.5–14.5)
WBC: 10.1 10*3/uL (ref 3.8–10.6)

## 2017-09-05 LAB — BRAIN NATRIURETIC PEPTIDE: B Natriuretic Peptide: 274 pg/mL — ABNORMAL HIGH (ref 0.0–100.0)

## 2017-09-05 LAB — TROPONIN I
Troponin I: <0.03 ng/mL (ref <0.03)
Troponin I: <0.03 ng/mL (ref <0.03)

## 2017-09-05 LAB — GLUCOSE, CAPILLARY: GLUCOSE-CAPILLARY: 239 mg/dL — AB (ref 65–99)

## 2017-09-05 MED ORDER — CARVEDILOL 6.25 MG PO TABS: 6.2500 mg | ORAL_TABLET | Freq: Two times a day (BID) | ORAL | Status: DC

## 2017-09-05 MED ORDER — DOCUSATE SODIUM 100 MG PO CAPS: 100.0000 mg | ORAL_CAPSULE | Freq: Two times a day (BID) | ORAL | Status: DC | PRN

## 2017-09-05 MED ORDER — ATORVASTATIN CALCIUM 20 MG PO TABS
20.0000 mg | ORAL_TABLET | Freq: Every evening | ORAL | Status: DC
  Administered 2017-09-05 – 2017-09-06 (×2): 20 mg via ORAL
  Filled 2017-09-05 (×2): qty 1

## 2017-09-05 MED ORDER — PIOGLITAZONE HCL 30 MG PO TABS
30.0000 mg | ORAL_TABLET | Freq: Every day | ORAL | Status: DC
  Administered 2017-09-06: 30 mg via ORAL
  Filled 2017-09-05: qty 1

## 2017-09-05 MED ORDER — SODIUM CHLORIDE 0.9% FLUSH
3.0000 mL | Freq: Two times a day (BID) | INTRAVENOUS | Status: DC
  Administered 2017-09-05 – 2017-09-07 (×5): 3 mL via INTRAVENOUS

## 2017-09-05 MED ORDER — VITAMIN C & E COMBINATION 500-400 MG-UNIT PO CAPS: 1.0000 | ORAL_CAPSULE | Freq: Every day | ORAL | Status: DC

## 2017-09-05 MED ORDER — ADULT MULTIVITAMIN W/MINERALS CH
ORAL_TABLET | Freq: Every day | ORAL | Status: DC
  Administered 2017-09-06 – 2017-09-07 (×2): 1 via ORAL
  Filled 2017-09-05 (×2): qty 1

## 2017-09-05 MED ORDER — GLIMEPIRIDE 1 MG PO TABS
1.0000 mg | ORAL_TABLET | Freq: Every day | ORAL | Status: DC
  Administered 2017-09-06 – 2017-09-07 (×2): 1 mg via ORAL
  Filled 2017-09-05 (×2): qty 1

## 2017-09-05 MED ORDER — VITAMIN E 180 MG (400 UNIT) PO CAPS
400.0000 [IU] | ORAL_CAPSULE | Freq: Every day | ORAL | Status: DC
  Administered 2017-09-06 – 2017-09-07 (×2): 400 [IU] via ORAL
  Filled 2017-09-05 (×2): qty 1

## 2017-09-05 MED ORDER — VITAMIN C 500 MG PO TABS
500.0000 mg | ORAL_TABLET | Freq: Every day | ORAL | Status: DC
  Administered 2017-09-06 – 2017-09-07 (×2): 500 mg via ORAL
  Filled 2017-09-05 (×2): qty 1

## 2017-09-05 MED ORDER — FUROSEMIDE 10 MG/ML IJ SOLN
20.0000 mg | Freq: Three times a day (TID) | INTRAMUSCULAR | Status: DC
  Administered 2017-09-05 – 2017-09-07 (×5): 20 mg via INTRAVENOUS
  Filled 2017-09-05 (×5): qty 2

## 2017-09-05 MED ORDER — FUROSEMIDE 10 MG/ML IJ SOLN
60.0000 mg | Freq: Once | INTRAMUSCULAR | Status: AC
  Administered 2017-09-05: 60 mg via INTRAVENOUS
  Filled 2017-09-05: qty 8

## 2017-09-05 MED ORDER — TAMSULOSIN HCL 0.4 MG PO CAPS
0.4000 mg | ORAL_CAPSULE | Freq: Every day | ORAL | Status: DC
  Administered 2017-09-06 – 2017-09-07 (×2): 0.4 mg via ORAL
  Filled 2017-09-05 (×2): qty 1

## 2017-09-05 MED ORDER — NITROGLYCERIN 2 % TD OINT
0.5000 [in_us] | TOPICAL_OINTMENT | Freq: Once | TRANSDERMAL | Status: AC
  Administered 2017-09-05: 0.5 [in_us] via TOPICAL
  Filled 2017-09-05: qty 1

## 2017-09-05 MED ORDER — PANTOPRAZOLE SODIUM 40 MG PO TBEC
40.0000 mg | DELAYED_RELEASE_TABLET | Freq: Every day | ORAL | Status: DC
  Administered 2017-09-06 – 2017-09-07 (×2): 40 mg via ORAL
  Filled 2017-09-05 (×2): qty 1

## 2017-09-05 MED ORDER — LISINOPRIL 20 MG PO TABS
20.0000 mg | ORAL_TABLET | Freq: Every evening | ORAL | Status: DC
  Administered 2017-09-06: 20 mg via ORAL
  Filled 2017-09-05: qty 1

## 2017-09-05 MED ORDER — CARVEDILOL 6.25 MG PO TABS
6.2500 mg | ORAL_TABLET | Freq: Two times a day (BID) | ORAL | Status: DC
  Administered 2017-09-05 – 2017-09-07 (×4): 6.25 mg via ORAL
  Filled 2017-09-05 (×4): qty 1

## 2017-09-05 MED ORDER — AMLODIPINE BESYLATE 5 MG PO TABS
5.0000 mg | ORAL_TABLET | Freq: Every evening | ORAL | Status: DC
  Administered 2017-09-05 – 2017-09-06 (×2): 5 mg via ORAL
  Filled 2017-09-05 (×2): qty 1

## 2017-09-05 NOTE — ED Notes (Signed)
Report called to floor and pt transported on telemetry monitor by EDT, Mayra; pt accomp by wife

## 2017-09-05 NOTE — ED Triage Notes (Signed)
Pt sent for evaluation from urology. Pt was there for bladder scan and to evaluate urinary retention. Urology unable to complete exam due to dyspnea. Pt orthopneic, tachypneic and tachycardic in triage. Pt with audible rhonchi and productive cough. Pt reports history of CHF.

## 2017-09-05 NOTE — Progress Notes (Signed)
09/05/2017 1:01 PM   Cassandria Santee [place] 02-27-1935 [id number]  Referring provider: Kirk Ruths, MD [facility] [place], [place] [address]  Chief Complaint  Patient presents with  . Urinary Retention    HPI: 83 year old male with history of BPH, elevated PSA who returns to the office today after developing urinary retention in setting of acute medical illness.  Shortly after seeing Dr. Bernardo Heater for discussion of his elevated PSA, he developed flash pulmonary edema and hypoxia requiring ICU admission on 08/15/2017 ultimately discharged home on 08/19/2017.  During this admission, he developed urinary retention requiring placement of Foley catheter and urological follow-up was arranged.  Of note, he was receiving copious amount of Lasix for diuresis  Today, he is quite anxious to have his catheter removed.  He reports that he never received any phone calls regarding his repeat PSA and was surprised to learn that an endorectal MRI had been ordered.  He reports that he prefer to follow-up with Dr. Bernardo Heater to discuss this prior to proceeding with any imaging.   PMH: Past Medical History:  Diagnosis Date  . Diabetes mellitus without complication ([place])   . Hyperlipidemia   . Hypertension     Surgical History: Past Surgical History:  Procedure Laterality Date  . BACK SURGERY    . CARDIAC SURGERY    . STOMACH SURGERY      Home Medications:  Allergies as of 09/05/2017      Reactions   Heparin    REACTION: Unspecified   Repaglinide Other (See Comments)   Increased appetite Increased appetite Increased appetite      Medication List        Accurate as of 09/05/17  1:00 PM. Always use your most recent med list.          amLODipine 10 MG tablet Commonly known as:  NORVASC Take 10 mg by mouth daily.   Apple Cider Vinegar 300 MG Tabs Take by mouth.   aspirin 325 MG tablet Take 325 mg by mouth daily.   atorvastatin 20 MG tablet Commonly  known as:  LIPITOR Take 20 mg by mouth daily.   carvedilol 6.25 MG tablet Commonly known as:  COREG Take 1 tablet (6.25 mg total) by mouth 2 (two) times daily with a meal.   furosemide 40 MG tablet Commonly known as:  LASIX Take 1 tablet (40 mg total) by mouth 2 (two) times daily.   glimepiride 1 MG tablet Commonly known as:  AMARYL Take 1 mg by mouth daily with breakfast.   isosorbide mononitrate 30 MG 24 hr tablet Commonly known as:  IMDUR Take 1 tablet (30 mg total) by mouth daily.   lisinopril 20 MG tablet Commonly known as:  PRINIVIL,ZESTRIL Take 20 mg by mouth daily.   multivitamin capsule Take 1 capsule by mouth daily.   pioglitazone 30 MG tablet Commonly known as:  ACTOS Take 30 mg by mouth daily.   tamsulosin 0.4 MG Caps capsule Commonly known as:  FLOMAX Take 1 capsule (0.4 mg total) by mouth daily.   VITAMIN C & E COMBINATION PO Take by mouth.       Allergies:  Allergies  Allergen Reactions  . Heparin     REACTION: Unspecified  . Repaglinide Other (See Comments)    Increased appetite Increased appetite Increased appetite     Family History: Family History  Problem Relation Age of Onset  . Heart disease Mother   . Diabetes Father   .  Prostate cancer Neg Hx   . Kidney cancer Neg Hx   . Bladder Cancer Neg Hx     Social History:  reports that  has never smoked. he has never used smokeless tobacco. He reports that he does not drink alcohol or use drugs.  ROS: UROLOGY Frequent Urination?: No Hard to postpone urination?: No Burning/pain with urination?: No Get up at night to urinate?: No Leakage of urine?: No Urine stream starts and stops?: No Trouble starting stream?: No Do you have to strain to urinate?: No Blood in urine?: No Urinary tract infection?: No Sexually transmitted disease?: No Injury to kidneys or bladder?: No Painful intercourse?: No Weak stream?: No Erection problems?: No Penile pain?:  No  Gastrointestinal Nausea?: No Vomiting?: No Indigestion/heartburn?: No Diarrhea?: No Constipation?: No  Constitutional Fever: No Night sweats?: No Weight loss?: No Fatigue?: No  Skin Skin rash/lesions?: No Itching?: No  Eyes Blurred vision?: No Double vision?: No  Ears/Nose/Throat Sore throat?: No Sinus problems?: No  Hematologic/Lymphatic Swollen glands?: No Easy bruising?: No  Cardiovascular Leg swelling?: Yes Chest pain?: No  Respiratory Cough?: No Shortness of breath?: Yes  Endocrine Excessive thirst?: No  Musculoskeletal Back pain?: No Joint pain?: No  Neurological Headaches?: No Dizziness?: No  Psychologic Depression?: No Anxiety?: No  Physical Exam: BP 127/65   Pulse 99   Ht 5\' 6"  (1.676 m)   Wt 202 lb (91.6 kg)   BMI 32.60 kg/m   Constitutional:  Alert and oriented, No acute distress. HEENT: [place] AT, moist mucus membranes.  Trachea midline, no masses. Cardiovascular: Significant marked lower extremity edema pitting bilaterally. Respiratory: Normal respiratory effort, no increased work of breathing. GI: Abdomen is soft, nontender, nondistended, no abdominal masses GU: Foley in place draining clear yellow urine. Skin: No rashes, bruises or suspicious lesions. Neurologic: Grossly intact, no focal deficits, moving all 4 extremities. Psychiatric: Somewhat agitated.  Laboratory Data: Lab Results  Component Value Date   WBC 5.5 08/18/2017   HGB 10.7 (L) 08/18/2017   HCT 31.4 (L) 08/18/2017   MCV 92.9 08/18/2017   PLT 181 08/18/2017    Lab Results  Component Value Date   CREATININE 2.01 (H) 08/18/2017    Lab Results  Component Value Date   PSA1 8.3 (H) 08/15/2017    Lab Results  Component Value Date   HGBA1C 6.9 (H) 08/15/2017    Urinalysis N/a  Pertinent Imaging: N/a  Assessment & Plan:    1. Urinary retention Acute urinary retention in the setting of severe medical illness Retention likely exacerbated by  diuresis in the setting of underlying BPH Continue Flomax Voiding trial today, was able to void approximately half of what was instilled Encourage patient to follow-up this afternoon for repeat bladder scan to ensure that he is emptying adequately  2. Elevated PSA Patient unwilling to have endorectal MRI until he discusses this further with Dr. Bernardo Heater Plan to arrange follow-up as requested  Return for f/u with Dr. Bernardo Heater to discuss prostate MRI.  Hollice Espy, MD  [facility] [address] [place], [place] [address] [phone]

## 2017-09-05 NOTE — Progress Notes (Signed)
Family Meeting Note  Advance Directive:yes  Today a meeting took place with the Patient and spouse.  The following clinical team members were present during this meeting:MD  The following were discussed:Patient's diagnosis: CHF, BPH, Htn , Patient's progosis: Unable to determine and Goals for treatment: Full Code  Additional follow-up to be provided: cardio consult.  Time spent during discussion:20 minutes  Vaughan Basta, MD

## 2017-09-05 NOTE — ED Notes (Signed)
Pt uprite on stretcher in exam room with no distress noted; pt denies any c/o and st feeling much better & ready to go home; wife at bedside; both voice good understanding of plan of care for admission; oxim 100% on 4l/min via [place]; O2 decreased to 2l/min to bring sat to 97%

## 2017-09-05 NOTE — ED Notes (Addendum)
Dr Anselm Jungling, hospitalist at bedside to speak with pt; Sandwich tray, sprite and juice given to pt at request for something to eat

## 2017-09-05 NOTE — H&P (Signed)
[facility] NAME: Bill Peterson    MR#:  [id number]  DATE OF BIRTH:  1935/03/27  DATE OF ADMISSION:  09/05/2017  PRIMARY CARE PHYSICIAN: Kirk Ruths, MD   REQUESTING/REFERRING PHYSICIAN: [email or web address]  CHIEF COMPLAINT:   Chief Complaint  Patient presents with  . Shortness of Breath  . Urinary Retention    HISTORY OF PRESENT ILLNESS: Nthony Peterson  is a 83 y.o. male with a known history of BPH, CHF, Htn, HLD- was admitted 2 weeks ago for CHF, also noted BPH and sent home with foley , follow up in urology clinic. Today went to Urology clinic and they took cath out in morning, told him to drink a lot of water and come back at 3 pm to check, if he is urinating. He drank 3 bottles 16 Oz water, went back- was noted SOB and hypoxic, so sent to ER> Noted Hypoxic, started on lasix and foley placed again.  PAST MEDICAL HISTORY:   Past Medical History:  Diagnosis Date  . BPH (benign prostatic hyperplasia)   . CHF (congestive heart failure) ([place])   . Diabetes mellitus without complication ([place])   . Hyperlipidemia   . Hypertension     PAST SURGICAL HISTORY:  Past Surgical History:  Procedure Laterality Date  . BACK SURGERY    . CARDIAC SURGERY    . STOMACH SURGERY      SOCIAL HISTORY:  Social History   Tobacco Use  . Smoking status: Never Smoker  . Smokeless tobacco: Never Used  Substance Use Topics  . Alcohol use: No    FAMILY HISTORY:  Family History  Problem Relation Age of Onset  . Heart disease Mother   . Diabetes Father   . Prostate cancer Neg Hx   . Kidney cancer Neg Hx   . Bladder Cancer Neg Hx     DRUG ALLERGIES:  Allergies  Allergen Reactions  . Heparin     REACTION: Unspecified  . Repaglinide Other (See Comments)    Increased appetite Increased appetite Increased appetite     REVIEW OF SYSTEMS:   CONSTITUTIONAL: No fever, fatigue or weakness.  EYES: No blurred or double vision.  EARS, NOSE,  AND THROAT: No tinnitus or ear pain.  RESPIRATORY: No cough, shortness of breath, wheezing or hemoptysis.  CARDIOVASCULAR: No chest pain,positive for orthopnea, edema.  GASTROINTESTINAL: No nausea, vomiting, diarrhea or abdominal pain.  GENITOURINARY: No dysuria, hematuria.  ENDOCRINE: No polyuria, nocturia,  HEMATOLOGY: No anemia, easy bruising or bleeding SKIN: No rash or lesion. MUSCULOSKELETAL: No joint pain or arthritis.   NEUROLOGIC: No tingling, numbness, weakness.  PSYCHIATRY: No anxiety or depression.   MEDICATIONS AT HOME:  Prior to Admission medications   Medication Sig Start Date End Date Taking? Authorizing Provider  amLODipine (NORVASC) 5 MG tablet Take 5 mg by mouth daily.    Yes [provider]  aspirin 325 MG tablet Take 325 mg by mouth daily.     Yes [provider]  atorvastatin (LIPITOR) 20 MG tablet Take 20 mg by mouth daily.   Yes [provider]  carvedilol (COREG) 6.25 MG tablet Take 1 tablet (6.25 mg total) by mouth 2 (two) times daily with a meal. Patient taking differently: Take 12.5 mg by mouth 2 (two) times daily with a meal.  08/19/17  Yes [name], MD  glimepiride (AMARYL) 1 MG tablet Take 1 mg by mouth daily with breakfast.   Yes [provider]  lisinopril (PRINIVIL,ZESTRIL) 20 MG tablet Take 20 mg by mouth daily.   Yes [provider]  Multiple Vitamin (MULTIVITAMIN) capsule Take 1 capsule by mouth daily.     Yes [provider]  omeprazole (PRILOSEC) 20 MG capsule Take 20 mg by mouth daily.   Yes [provider]  pioglitazone (ACTOS) 30 MG tablet Take 30 mg by mouth daily.   Yes [provider]  torsemide (DEMADEX) 20 MG tablet Take 20 mg by mouth daily.   Yes [provider]  Vitamins C E (VITAMIN C & E COMBINATION) 500-400 MG-UNIT CAPS Take 1 capsule by mouth daily.    Yes [provider]  furosemide (LASIX) 40 MG tablet Take 1 tablet (40 mg total) by mouth 2  (two) times daily. Patient not taking: Reported on 09/05/2017 08/19/17   [name], MD  isosorbide mononitrate (IMDUR) 30 MG 24 hr tablet Take 1 tablet (30 mg total) by mouth daily. Patient not taking: Reported on 09/05/2017 08/19/17   [name], MD  tamsulosin (FLOMAX) 0.4 MG CAPS capsule Take 1 capsule (0.4 mg total) by mouth daily. 08/19/17   [name], MD      PHYSICAL EXAMINATION:   VITAL SIGNS: Blood pressure 136/67, pulse 81, temperature 98.1 F (36.7 C), temperature source Oral, resp. rate 15, height 5\' 6"  (1.676 m), weight 91.6 kg (202 lb), SpO2 100 %.  GENERAL:  83 y.o.-year-old patient lying in the bed with no acute distress.  EYES: Pupils equal, round, reactive to light and accommodation. No scleral icterus. Extraocular muscles intact.  HEENT: Head atraumatic, normocephalic. Oropharynx and nasopharynx clear.  NECK:  Supple, no jugular venous distention. No thyroid enlargement, no tenderness.  LUNGS: Normal breath sounds bilaterally, no wheezing, have b/l crepitation. No use of accessory muscles of respiration.  CARDIOVASCULAR: S1, S2 normal. No murmurs, rubs, or gallops.  ABDOMEN: Soft, nontender, nondistended. Bowel sounds present. No organomegaly or mass.  EXTREMITIES: b/l pedal edema,no cyanosis, or clubbing.  NEUROLOGIC: Cranial nerves II through XII are intact. Muscle strength 5/5 in all extremities. Sensation intact. Gait not checked.  PSYCHIATRIC: The patient is alert and oriented x 3.  SKIN: No obvious rash, lesion, or ulcer.   LABORATORY PANEL:   CBC Recent Labs  Lab 09/05/17 1614  WBC 10.1  HGB 12.3*  HCT 36.9*  PLT 231  MCV 94.4  MCH 31.4  MCHC 33.2  RDW 15.2*   ------------------------------------------------------------------------------------------------------------------  Chemistries  Recent Labs  Lab 09/05/17 1614  NA 137  K 3.6  CL 100*  CO2 26  GLUCOSE 158*  BUN 21*  CREATININE 1.87*  CALCIUM 8.9    ------------------------------------------------------------------------------------------------------------------ estimated creatinine clearance is 33.4 mL/min (A) (by C-G formula based on SCr of 1.87 mg/dL (H)). ------------------------------------------------------------------------------------------------------------------ No results for input(s): TSH, T4TOTAL, T3FREE, THYROIDAB in the last 72 hours.  Invalid input(s): FREET3   Coagulation profile No results for input(s): INR, PROTIME in the last 168 hours. ------------------------------------------------------------------------------------------------------------------- No results for input(s): DDIMER in the last 72 hours. -------------------------------------------------------------------------------------------------------------------  Cardiac Enzymes Recent Labs  Lab 09/05/17 1614  TROPONINI <0.03   ------------------------------------------------------------------------------------------------------------------ Invalid input(s): POCBNP  ---------------------------------------------------------------------------------------------------------------  Urinalysis    Component Value Date/Time   COLORURINE PINK (A) 09/05/2017 1749   APPEARANCEUR CLEAR (A) 09/05/2017 1749   LABSPEC 1.003 (L) 09/05/2017 1749   PHURINE 7.0 09/05/2017 1749   GLUCOSEU NEGATIVE 09/05/2017 1749   HGBUR LARGE (A) 09/05/2017 1749   BILIRUBINUR NEGATIVE 09/05/2017 1749   KETONESUR NEGATIVE 09/05/2017 1749   PROTEINUR NEGATIVE 09/05/2017  1749   UROBILINOGEN 1.0 09/30/2007 1533   NITRITE NEGATIVE 09/05/2017 1749   LEUKOCYTESUR NEGATIVE 09/05/2017 1749     RADIOLOGY: Dg Chest 2 View  Result Date: 09/05/2017 CLINICAL DATA:  Pt sent for evaluation from urology. Pt was there for bladder scan and to evaluate urinary retention. Urology unable to complete exam due to dyspnea. Pt orthopneic, tachypneic and tachycardic in triage. Pt with audible rhonchi  an.*comment was truncated* EXAM: CHEST  2 VIEW COMPARISON:  08/17/2017 FINDINGS: Midline trachea. Borderline cardiomegaly. Suspect trace right pleural fluid. Thickening of the right minor fissure. No pneumothorax. Moderate pulmonary interstitial prominence is greater on the right and slightly improved since the prior. Right greater than left perihilar airspace disease is also decreased. IMPRESSION: Slight decrease in right greater than left interstitial and airspace disease. Favor improving asymmetric pulmonary edema. Probable trace right pleural fluid. Electronically Signed   By: Abigail Miyamoto M.D.   On: 09/05/2017 16:51    EKG: Orders placed or performed during the hospital encounter of 09/05/17  . EKG 12-Lead  . EKG 12-Lead  . ED EKG within 10 minutes  . ED EKG within 10 minutes  . EKG 12-Lead  . EKG 12-Lead    IMPRESSION AND PLAN:  * Ac hypoxic respi failure   Ac diastolic CHF    IV lasix I/o monitor , Fluid restriction Cardio consult, Echo done 2 weeks ago.  counseled about fluid restriction  * Hematuria, BPH   If persist, may need urology consult   Otherwise try to take out foley before discharge   Cont Flomax  * Htn    Cont meds  * Hyperlipidemia   COnt statin.  * CKD stage 3    Appears stable, monitor.  All the records are reviewed and case discussed with ED provider. Management plans discussed with the patient, family and they are in agreement.  CODE STATUS: Code Status History    Date Active Date Inactive Code Status Order ID Comments User Context   08/15/2017 20:04 08/19/2017 14:38 Full Code [id number]  Gladstone Lighter, MD Inpatient       TOTAL TIME TAKING CARE OF THIS PATIENT: 45 minutes.  Wife in room.  Vaughan Basta M.D on 09/05/2017   Between 7am to 6pm - Pager - [phone]  After 6pm go to [email or web address] - password [email or web address]  Office  [phone]  CC: Primary care physician; Kirk Ruths,  MD   Note: This dictation was prepared with Dragon dictation along with smaller phrase technology. Any transcriptional errors that result from this process are unintentional.

## 2017-09-05 NOTE — Progress Notes (Signed)
Fill and Pull Catheter Removal  Patient is present today for a catheter removal.  Patient was cleaned and prepped in a sterile fashion 229ml of sterile water/ saline was instilled into the bladder when the patient felt the urge to urinate. 50ml of water was then drained from the balloon.  A 16FR foley cath was removed from the bladder no complications were noted .  Patient as then given some time to void on their own.  Patient can void  147ml on their own after some time.  Patient tolerated well.  Preformed by: Fonnie Jarvis, CMA  Follow up/ Additional notes: patient to return this PM for a PVR

## 2017-09-05 NOTE — ED Provider Notes (Signed)
[facility] Emergency Department Provider Note  Time seen: 4:24 PM  I have reviewed the triage vital signs and the nursing notes.   HISTORY  Chief Complaint Shortness of Breath and Urinary Retention    HPI Bill Peterson is a 83 y.o. male with a past medical history of diabetes, hypertension, hyperlipidemia, prior MI, CHF, presents to the emergency department for shortness of breath.  According to the patient and record review the patient was discharged from the hospital approximately 3 weeks ago after an admission for CHF and respiratory failure.  Patient states since going home he has had a Foley catheter inserted, he went to a routine follow-up with urology today to have the catheter removed.  Patient had a catheter removed this morning was told to drink plenty of water and come back at 3 PM for a bladder scan.  Patient states he is only been able to urinate very little since the catheter has been removed.  He returned to urology but could not tolerate the bladder scan due to shortness of breath and was referred to the emergency department.  Here the patient is short of breath satting 87% on room air, tachypneic around 30 breaths/min and hypertensive around 295 systolic.  Patient has a history of diastolic heart failure per record review with a normal to increase EF 65-70%.  Patient states shortness of breath just started this afternoon.  Denies any shortness of breath earlier today.  Denies any home O2 use.  Past Medical History:  Diagnosis Date  . Diabetes mellitus without complication ([place])   . Hyperlipidemia   . Hypertension     Patient Active Problem List   Diagnosis Date Noted  . Pulmonary edema 08/15/2017  . NEOPLASM UNCERTAIN BHV CNCTV&OTH SOFT TISSUE 09/18/2007  . DIABETES MELLITUS 09/18/2007  . HYPERTENSION 09/18/2007  . MYOCARDIAL INFARCTION 09/18/2007    Past Surgical History:  Procedure Laterality Date  . BACK SURGERY    . CARDIAC SURGERY    .  STOMACH SURGERY      Prior to Admission medications   Medication Sig Start Date End Date Taking? Authorizing Provider  amLODipine (NORVASC) 10 MG tablet Take 10 mg by mouth daily.    [provider]  Apple Cider Vinegar 300 MG TABS Take by mouth.    [provider]  aspirin 325 MG tablet Take 325 mg by mouth daily.      [provider]  atorvastatin (LIPITOR) 20 MG tablet Take 20 mg by mouth daily.    [provider]  carvedilol (COREG) 6.25 MG tablet Take 1 tablet (6.25 mg total) by mouth 2 (two) times daily with a meal. 08/19/17   [name], MD  furosemide (LASIX) 40 MG tablet Take 1 tablet (40 mg total) by mouth 2 (two) times daily. 08/19/17   [name], MD  glimepiride (AMARYL) 1 MG tablet Take 1 mg by mouth daily with breakfast.    [provider]  isosorbide mononitrate (IMDUR) 30 MG 24 hr tablet Take 1 tablet (30 mg total) by mouth daily. 08/19/17   [name], MD  lisinopril (PRINIVIL,ZESTRIL) 20 MG tablet Take 20 mg by mouth daily.    [provider]  Multiple Vitamin (MULTIVITAMIN) capsule Take 1 capsule by mouth daily.      [provider]  pioglitazone (ACTOS) 30 MG tablet Take 30 mg by mouth daily.    [provider]  tamsulosin (FLOMAX) 0.4 MG CAPS capsule Take 1 capsule (0.4 mg total)  by mouth daily. 08/19/17   [name], MD  Vitamins C E (VITAMIN C & E COMBINATION PO) Take by mouth.      [provider]    Allergies  Allergen Reactions  . Heparin     REACTION: Unspecified  . Repaglinide Other (See Comments)    Increased appetite Increased appetite Increased appetite     Family History  Problem Relation Age of Onset  . Heart disease Mother   . Diabetes Father   . Prostate cancer Neg Hx   . Kidney cancer Neg Hx   . Bladder Cancer Neg Hx     Social History Social History   Tobacco Use  . Smoking status: Never Smoker  . Smokeless tobacco: Never Used  Substance Use Topics  .  Alcohol use: No  . Drug use: No    Review of Systems Constitutional: Negative for fever. Eyes: Negative for visual complaints. ENT: Negative for congestion Cardiovascular: Negative for chest pain. Respiratory: Positive for shortness of breath, worse with exertion or when sitting/lying down. Gastrointestinal: Negative for abdominal pain, vomiting  Genitourinary: Difficulty urinating today since catheter was removed this morning. Musculoskeletal: Positive for leg swelling. Skin: Negative for rash. Neurological: Negative for headache All other ROS negative  ____________________________________________   PHYSICAL EXAM:  VITAL SIGNS: ED Triage Vitals  Enc Vitals Group     BP 09/05/17 1552 (!) 197/92     Pulse Rate 09/05/17 1552 (!) 114     Resp 09/05/17 1552 (!) 26     Temp 09/05/17 1552 98.1 F (36.7 C)     Temp Source 09/05/17 1552 Oral     SpO2 09/05/17 1552 94 %     Weight 09/05/17 1553 202 lb (91.6 kg)     Height 09/05/17 1553 5\' 6"  (1.676 m)     Head Circumference --      Peak Flow --      Pain Score 09/05/17 1552 0     Pain Loc --      Pain Edu? --      Excl. in [place]? --     Constitutional: Alert and oriented.  Mild distress due to difficulty breathing, currently standing upright because he cannot sit or lie down due to difficulty breathing.  Speaking in 3-4 word sentences. Eyes: Normal exam ENT   Head: Normocephalic and atraumatic   Mouth/Throat: Mucous membranes are moist. Cardiovascular: Normal rate, regular rhythm. No murmur Respiratory: Moderate tachypnea around 30 breaths/min with diffuse rhonchi in both lung fields.  Patient standing currently due to shortness of breath when sitting or lying. Gastrointestinal: Soft and nontender. No distention.  Musculoskeletal: Nontender with normal range of motion in all extremities.  3+ lower extremity edema, equal bilaterally, calves are nontender. Neurologic:  Normal speech and language. No gross focal neurologic  deficits Skin:  Skin is warm, dry and intact.  Psychiatric: Mood and affect are normal.   ____________________________________________    EKG  EKG reviewed and interpreted by myself shows sinus tachycardia 115 bpm with a narrow QRS, normal axis, normal intervals, nonspecific ST changes but no ST elevation.  ____________________________________________    RADIOLOGY  Chest x-ray consistent with asymmetric pulmonary edema.  ____________________________________________   INITIAL IMPRESSION / ASSESSMENT AND PLAN / ED COURSE  Pertinent labs & imaging results that were available during my care of the patient were reviewed by me and considered in my medical decision making (see chart for details).  Vision presents to the emergency department for increased difficulty breathing and  inability to fully urinate.  Differential would include CHF exacerbation, pneumonia, pneumothorax, ACS, urinary retention, urinary tract infection.  We will check labs, chest x-ray, continue to closely monitor in the emergency department.  Given the diffuse rhonchi on exam with history of CHF we will start the patient on IV Lasix.  Given his hypoxia he is currently receiving supplemental oxygen.  Patient unable to urinate in the emergency department we will replace the Foley catheter.  Anticipate likely admission given hypoxia and likelihood of CHF exacerbation.  Blood pressure currently 197/92 we will place 1 inch of nitroglycerin paste and reassess.  X-ray consistent with pulmonary edema.  Patient's potassium is 3.6.  Receiving IV Lasix, nitroglycerin ointment.  Patient had greater than 1 L from Foley catheter, appears much more comfortable lying in bed currently.  Given the patient's hypoxia with pulmonary edema on chest x-ray will admit to the hospital for further treatment including further diuresis.  Patient agreeable to this plan of care. ____________________________________________   FINAL CLINICAL  IMPRESSION(S) / ED DIAGNOSES  CHF exacerbation Pulmonary edema Acute urinary retention   Harvest Dark, MD 09/05/17 1735

## 2017-09-06 ENCOUNTER — Inpatient Hospital Stay: Payer: Medicare Other

## 2017-09-06 LAB — GLUCOSE, CAPILLARY
GLUCOSE-CAPILLARY: 166 mg/dL — AB (ref 65–99)
Glucose-Capillary: 152 mg/dL — ABNORMAL HIGH (ref 65–99)
Glucose-Capillary: 145 mg/dL — ABNORMAL HIGH (ref 65–99)
Glucose-Capillary: 147 mg/dL — ABNORMAL HIGH (ref 65–99)

## 2017-09-06 LAB — BASIC METABOLIC PANEL
Anion gap: 9 (ref 5–15)
BUN: 23 mg/dL — AB (ref 6–20)
CALCIUM: 8.6 mg/dL — AB (ref 8.9–10.3)
CHLORIDE: 104 mmol/L (ref 101–111)
CO2: 28 mmol/L (ref 22–32)
CREATININE: 1.76 mg/dL — AB (ref 0.61–1.24)
GFR calc Af Amer: 40 mL/min — ABNORMAL LOW (ref >60)
GFR calc non Af Amer: 35 mL/min — ABNORMAL LOW (ref >60)
GLUCOSE: 137 mg/dL — AB (ref 65–99)
Potassium: 3.5 mmol/L (ref 3.5–5.1)
Sodium: 141 mmol/L (ref 135–145)

## 2017-09-06 LAB — CBC
HCT: 31.2 % — ABNORMAL LOW (ref 40.0–52.0)
Hemoglobin: 10.5 g/dL — ABNORMAL LOW (ref 13.0–18.0)
MCH: 31 pg (ref 26.0–34.0)
MCHC: 33.5 g/dL (ref 32.0–36.0)
MCV: 92.6 fL (ref 80.0–100.0)
PLATELETS: 193 10*3/uL (ref 150–440)
RBC: 3.37 MIL/uL — ABNORMAL LOW (ref 4.40–5.90)
RDW: 14.5 % (ref 11.5–14.5)
WBC: 4.8 10*3/uL (ref 3.8–10.6)

## 2017-09-06 LAB — TROPONIN I
Troponin I: 0.03 ng/mL (ref <0.03)
Troponin I: 0.03 ng/mL (ref <0.03)

## 2017-09-06 MED ORDER — INSULIN ASPART 100 UNIT/ML ~~LOC~~ SOLN
0.0000 [IU] | Freq: Three times a day (TID) | SUBCUTANEOUS | Status: DC
  Administered 2017-09-06: 1 [IU] via SUBCUTANEOUS
  Administered 2017-09-07: 2 [IU] via SUBCUTANEOUS
  Administered 2017-09-07: 3 [IU] via SUBCUTANEOUS
  Filled 2017-09-06 (×3): qty 1

## 2017-09-06 MED ORDER — INSULIN ASPART 100 UNIT/ML ~~LOC~~ SOLN
0.0000 [IU] | Freq: Every day | SUBCUTANEOUS | Status: DC
Start: 2017-09-06 — End: 2017-09-07

## 2017-09-06 NOTE — Care Management Note (Signed)
Case Management Note  Patient Details  Name: COLLIE WERNICK MRN: [id number] Date of Birth: Feb 17, 1935  Subjective/Objective:                 Patient has recent discharge from [facility] 12/[facility] after presenting with new onset congestive heart failure with flash pulmonary edema. He is readmitted with heart failure.  Patient required 02 initially but has been weaned.  There are no issues accessing medical care, access to scales, no issues with transportation  Action/Plan: Required home 02 assessment even though patient is on room air.  has required supplemental 02 during both admissions.  Patient may benefit from home health nurse.  Expected Discharge Date:                  Expected Discharge Plan:     In-House Referral:     Discharge planning Services     Post Acute Care Choice:    Choice offered to:     DME Arranged:    DME Agency:     HH Arranged:    HH Agency:     Status of Service:     If discussed at [employer], dates discussed:    Additional Comments:  Katrina Stack, RN 09/06/2017, 4:24 PM

## 2017-09-06 NOTE — Progress Notes (Signed)
[place] [facility] [place] NAME: Bill Peterson    MR#:  [id number]  DATE OF BIRTH:  [birth year]  SUBJECTIVE:  CHIEF COMPLAINT:   Chief Complaint  Patient presents with  . Shortness of Breath  . Urinary Retention   No complaints, feeling better  REVIEW OF SYSTEMS:  CONSTITUTIONAL: No fever, fatigue or weakness.  EYES: No blurred or double vision.  EARS, NOSE, AND THROAT: No tinnitus or ear pain.  RESPIRATORY: No cough, shortness of breath, wheezing or hemoptysis.  CARDIOVASCULAR: No chest pain, orthopnea, edema.  GASTROINTESTINAL: No nausea, vomiting, diarrhea or abdominal pain.  GENITOURINARY: No dysuria, hematuria.  ENDOCRINE: No polyuria, nocturia,  HEMATOLOGY: No anemia, easy bruising or bleeding SKIN: No rash or lesion. MUSCULOSKELETAL: No joint pain or arthritis.   NEUROLOGIC: No tingling, numbness, weakness.  PSYCHIATRY: No anxiety or depression.   ROS  DRUG ALLERGIES:   Allergies  Allergen Reactions  . Heparin     REACTION: Unspecified  . Repaglinide Other (See Comments)    Increased appetite Increased appetite Increased appetite     VITALS:  Blood pressure 129/60, pulse 95, temperature 97.9 F (36.6 C), temperature source Oral, resp. rate 18, height 5\' 6"  (1.676 m), weight 82.7 kg (182 lb 4.8 oz), SpO2 97 %.  PHYSICAL EXAMINATION:  GENERAL:  83 y.o.-year-old patient lying in the bed with no acute distress.  EYES: Pupils equal, round, reactive to light and accommodation. No scleral icterus. Extraocular muscles intact.  HEENT: Head atraumatic, normocephalic. Oropharynx and nasopharynx clear.  NECK:  Supple, no jugular venous distention. No thyroid enlargement, no tenderness.  LUNGS: Normal breath sounds bilaterally, no wheezing, rales,rhonchi or crepitation. No use of accessory muscles of respiration.  CARDIOVASCULAR: S1, S2 normal. No murmurs, rubs, or gallops.  ABDOMEN: Soft, nontender, nondistended. Bowel sounds  present. No organomegaly or mass.  EXTREMITIES: No pedal edema, cyanosis, or clubbing.  NEUROLOGIC: Cranial nerves II through XII are intact. Muscle strength 5/5 in all extremities. Sensation intact. Gait not checked.  PSYCHIATRIC: The patient is alert and oriented x 3.  SKIN: No obvious rash, lesion, or ulcer.   Physical Exam LABORATORY PANEL:   CBC Recent Labs  Lab 09/06/17 0645  WBC 4.8  HGB 10.5*  HCT 31.2*  PLT 193   ------------------------------------------------------------------------------------------------------------------  Chemistries  Recent Labs  Lab 09/06/17 0645  NA 141  K 3.5  CL 104  CO2 28  GLUCOSE 137*  BUN 23*  CREATININE 1.76*  CALCIUM 8.6*   ------------------------------------------------------------------------------------------------------------------  Cardiac Enzymes Recent Labs  Lab 09/06/17 0215 09/06/17 0645  TROPONINI 0.03* 0.03*   ------------------------------------------------------------------------------------------------------------------  RADIOLOGY:  Dg Chest 2 View  Result Date: 09/06/2017 CLINICAL DATA:  CHF. EXAM: CHEST  2 VIEW COMPARISON:  Chest x-ray from yesterday. FINDINGS: The cardiomediastinal silhouette remains borderline enlarged. Resolved pulmonary alveolar edema with mild residual right hilar peribronchial thickening. No focal consolidation, pleural effusion, or pneumothorax. No acute osseous abnormality. IMPRESSION: Nearly resolved pulmonary edema. Electronically Signed   By: Titus Dubin M.D.   On: 09/06/2017 07:35   Dg Chest 2 View  Result Date: 09/05/2017 CLINICAL DATA:  Pt sent for evaluation from urology. Pt was there for bladder scan and to evaluate urinary retention. Urology unable to complete exam due to dyspnea. Pt orthopneic, tachypneic and tachycardic in triage. Pt with audible rhonchi an.*comment was truncated* EXAM: CHEST  2 VIEW COMPARISON:  08/17/2017 FINDINGS: Midline trachea. Borderline  cardiomegaly. Suspect trace right pleural fluid. Thickening of the right minor fissure.  No pneumothorax. Moderate pulmonary interstitial prominence is greater on the right and slightly improved since the prior. Right greater than left perihilar airspace disease is also decreased. IMPRESSION: Slight decrease in right greater than left interstitial and airspace disease. Favor improving asymmetric pulmonary edema. Probable trace right pleural fluid. Electronically Signed   By: Abigail Miyamoto M.D.   On: 09/05/2017 16:51    ASSESSMENT AND PLAN:   1 acute hypoxic respiratory failure Secondary to acute on chronic diastolic congestive heart failure exacerbation Resolved Supplemental oxygen as needed  2 acute on chronic diastolic congestive heart failure exacerbation Resolving Cardiology input appreciated, continue Lasix, strict I&O monitoring, daily weights, and continue close medical monitoring Most recent echocardiogram from December 2018 noted for diastolic dysfunction stage I with normal ejection fraction  3 hx of hematuria, BPH Stable We will need to continue Foley given continued urinary retention follow-up with urology status post discharge Continue Flomax  4 acute on chronic urinary retention Continue Flomax  5 chronic benign essential hypertension Controlled on current regiment  6 chronic hyperlipidemia, unspecified Continue statin therapy  7 CKD stage 3 Appears stable  Full code Condition stable Disposition Home in 1-2 days with follow-up with primary care provider     All the records are reviewed and case discussed with Care Management/Social Workerr. Management plans discussed with the patient, family and they are in agreement.  CODE STATUS: full  TOTAL TIME TAKING CARE OF THIS PATIENT: 45 minutes.     POSSIBLE D/C IN 1-2 DAYS, DEPENDING ON CLINICAL CONDITION.   Avel Peace [place] M.D on 09/06/2017   Between 7am to 6pm - Pager - [phone]  After 6pm go to  [email or web address] - password [email or web address] [facility]  Office  [phone]  CC: Primary care physician; Kirk Ruths, MD  Note: This dictation was prepared with Dragon dictation along with smaller phrase technology. Any transcriptional errors that result from this process are unintentional.

## 2017-09-06 NOTE — Progress Notes (Signed)
Doctor Salary was page if he wants to put pt on sliding scale. Awaiting call. Will continue to monitor

## 2017-09-06 NOTE — Consult Note (Signed)
[place] Nurse wound consult note Reason for Consult:Acute CHF exacerbation.  MD order to inquire about compression.  Generalized edema to bilateral lower legs.  With chronic skin changes.  Positive pedal pulses palpable but faint due to edema.  Extremities warm.  History of coronary artery disease and no ABI found in medical record.  Cardiologist, Dr. Clayborn Bigness has written prescription to pharmacy in [place] [facility]).  I have encouraged patient to fil this prescription and begin to wear compression at home per Dr. Clayborn Bigness MD order.  He had not filled this Rx yet.  His father wore compression and he is familiar with this concept.  HE agrees to do that.   With no ABI (that I can see in the EMR), I will not compress at this time.   The edema is minimal and his CHF status is not stable today.  Dressing procedure/placement/frequency:Cleanse legs with soap and water daily.  Patient is to fill this prescription and begin compression once discharged, per MD order.  Will not follow at this time.  Please re-consult if needed.  Bill Moras RN BSN [place] Pager [phone]

## 2017-09-06 NOTE — Progress Notes (Signed)
Pt walked to the nurses station and tolerated well. Oxygen saturation was at 95 on ambulation. Will continue to monitor.

## 2017-09-06 NOTE — Consult Note (Signed)
[facility] Consultation Note  Patient ID: Bill Peterson, MRN: [id number], DOB/AGE: 81/01/1935 83 y.o. Admit date: 09/05/2017   Date of Consult: 09/06/2017 Primary Physician: Kirk Ruths, MD Primary Cardiologist: Call would  Chief Complaint:  Chief Complaint  Patient presents with  . Shortness of Breath  . Urinary Retention   Reason for Consult: Heart failure  HPI: 83 y.o. male with known chronic diastolic dysfunction heart failure with acute on chronic exacerbation of diastolic heart failure with progression of weight gain shortness of breath lower extremity edema and pulmonary edema by chest x-ray with hypoxia.  The patient has this is a significant increase in issues over the last 3-4days for which she was seen in the emergency room.  At that time the patient received intravenous Lasix with significant improvements of all.  He feels much better at this time with no further significant symptoms and no current evidence of myocardial infarction with normal troponin.  Patient was replaced on his previous appropriate medication management including carvedilol and lisinopril with amlodipine for better blood pressure control.  The patient has had an echocardiogram showing normal LV systolic function with diastolic dysfunction and left ventricular hypertrophy with moderate mitral regurgitation.  On exam today there is a slightly more mitral insufficiency concerns..  Additionally the patient does have some issues with chronic kidney disease which may have exacerbated above as well  Past Medical History:  Diagnosis Date  . BPH (benign prostatic hyperplasia)   . CHF (congestive heart failure) ([place])   . Diabetes mellitus without complication ([place])   . Hyperlipidemia   . Hypertension       Surgical History:  Past Surgical History:  Procedure Laterality Date  . BACK SURGERY    . CARDIAC SURGERY    . STOMACH SURGERY       Home Meds: Prior to Admission medications    Medication Sig Start Date End Date Taking? Authorizing Provider  amLODipine (NORVASC) 5 MG tablet Take 5 mg by mouth daily.    Yes [provider]  aspirin 325 MG tablet Take 325 mg by mouth daily.     Yes [provider]  atorvastatin (LIPITOR) 20 MG tablet Take 20 mg by mouth daily.   Yes [provider]  carvedilol (COREG) 6.25 MG tablet Take 1 tablet (6.25 mg total) by mouth 2 (two) times daily with a meal. Patient taking differently: Take 12.5 mg by mouth 2 (two) times daily with a meal.  08/19/17  Yes [name], MD  glimepiride (AMARYL) 1 MG tablet Take 1 mg by mouth daily with breakfast.   Yes [provider]  lisinopril (PRINIVIL,ZESTRIL) 20 MG tablet Take 20 mg by mouth daily.   Yes [provider]  Multiple Vitamin (MULTIVITAMIN) capsule Take 1 capsule by mouth daily.     Yes [provider]  omeprazole (PRILOSEC) 20 MG capsule Take 20 mg by mouth daily.   Yes [provider]  pioglitazone (ACTOS) 30 MG tablet Take 30 mg by mouth daily.   Yes [provider]  torsemide (DEMADEX) 20 MG tablet Take 20 mg by mouth daily.   Yes [provider]  Vitamins C E (VITAMIN C & E COMBINATION) 500-400 MG-UNIT CAPS Take 1 capsule by mouth daily.    Yes [provider]  furosemide (LASIX) 40 MG tablet Take 1 tablet (40 mg total) by mouth 2 (two) times daily. Patient not taking: Reported on 09/05/2017 08/19/17   [name], MD  isosorbide mononitrate (  IMDUR) 30 MG 24 hr tablet Take 1 tablet (30 mg total) by mouth daily. Patient not taking: Reported on 09/05/2017 08/19/17   [name], MD  tamsulosin (FLOMAX) 0.4 MG CAPS capsule Take 1 capsule (0.4 mg total) by mouth daily. 08/19/17   [name], MD    Inpatient Medications:  . amLODipine  5 mg Oral QPM  . atorvastatin  20 mg Oral QPM  . carvedilol  6.25 mg Oral BID WC  . furosemide  20 mg Intravenous Q8H  . glimepiride  1 mg Oral Q breakfast  . lisinopril  20  mg Oral QPM  . multivitamin with minerals   Oral Daily  . pantoprazole  40 mg Oral Daily  . pioglitazone  30 mg Oral Daily  . sodium chloride flush  3 mL Intravenous Q12H  . tamsulosin  0.4 mg Oral Daily  . vitamin C  500 mg Oral Daily   And  . vitamin E  400 Units Oral Daily     Allergies:  Allergies  Allergen Reactions  . Heparin     REACTION: Unspecified  . Repaglinide Other (See Comments)    Increased appetite Increased appetite Increased appetite     Social History   Socioeconomic History  . Marital status: Married    Spouse name: Not on file  . Number of children: Not on file  . Years of education: Not on file  . Highest education level: Not on file  Social Needs  . Financial resource strain: Not on file  . Food insecurity - worry: Not on file  . Food insecurity - inability: Not on file  . Transportation needs - medical: Not on file  . Transportation needs - non-medical: Not on file  Occupational History  . Not on file  Tobacco Use  . Smoking status: Never Smoker  . Smokeless tobacco: Never Used  Substance and Sexual Activity  . Alcohol use: No  . Drug use: No  . Sexual activity: Not on file  Other Topics Concern  . Not on file  Social History Narrative   Lives at home with his wife. Active at baseline     Family History  Problem Relation Age of Onset  . Heart disease Mother   . Diabetes Father   . Prostate cancer Neg Hx   . Kidney cancer Neg Hx   . Bladder Cancer Neg Hx      Review of Systems Positive for shortness of breath Negative for: General:  chills, fever, night sweats or weight changes.  Cardiovascular: PND orthopnea syncope dizziness  Dermatological skin lesions rashes Respiratory: Cough congestion Urologic: Frequent urination urination at night and hematuria Abdominal: negative for nausea, vomiting, diarrhea, bright red blood per rectum, melena, or hematemesis Neurologic: negative for visual changes, and/or hearing changes  All  other systems reviewed and are otherwise negative except as noted above.  Labs: Recent Labs    09/05/17 1614 09/05/17 2150 09/06/17 0215 09/06/17 0645  TROPONINI <0.03 <0.03 0.03* 0.03*   Lab Results  Component Value Date   WBC 4.8 09/06/2017   HGB 10.5 (L) 09/06/2017   HCT 31.2 (L) 09/06/2017   MCV 92.6 09/06/2017   PLT 193 09/06/2017    Recent Labs  Lab 09/06/17 0645  NA 141  K 3.5  CL 104  CO2 28  BUN 23*  CREATININE 1.76*  CALCIUM 8.6*  GLUCOSE 137*   Lab Results  Component Value Date   CHOL  11/04/2007    144  ATP III CLASSIFICATION:  <200     mg/dL   Desirable  200-239  mg/dL   Borderline High  >=240    mg/dL   High   HDL 23 (L) 11/02/2007   TRIG 129 11/04/2007   No results found for: DDIMER  Radiology/Studies:  Dg Chest 1 View  Result Date: 08/15/2017 CLINICAL DATA:  Severe shortness of breath, suspect pulmonary edema. History of previous MI, diabetes. EXAM: CHEST 1 VIEW COMPARISON:  Chest x-ray of November 03, 2017 FINDINGS: The lungs are well-expanded. The interstitial markings are diffusely increased. The pulmonary vascularity is engorged and indistinct. The cardiac silhouette is top-normal in size but not greatly changed. There is no pleural effusion. There is multilevel degenerative disc disease of the thoracic spine. IMPRESSION: Diffusely increased pulmonary interstitial markings likely reflects pulmonary edema. Widespread interstitial pneumonia could produce similar findings. Electronically Signed   By: David  Martinique M.D.   On: 08/15/2017 16:41   Dg Chest 2 View  Result Date: 09/06/2017 CLINICAL DATA:  CHF. EXAM: CHEST  2 VIEW COMPARISON:  Chest x-ray from yesterday. FINDINGS: The cardiomediastinal silhouette remains borderline enlarged. Resolved pulmonary alveolar edema with mild residual right hilar peribronchial thickening. No focal consolidation, pleural effusion, or pneumothorax. No acute osseous abnormality. IMPRESSION: Nearly resolved  pulmonary edema. Electronically Signed   By: Titus Dubin M.D.   On: 09/06/2017 07:35   Dg Chest 2 View  Result Date: 09/05/2017 CLINICAL DATA:  Pt sent for evaluation from urology. Pt was there for bladder scan and to evaluate urinary retention. Urology unable to complete exam due to dyspnea. Pt orthopneic, tachypneic and tachycardic in triage. Pt with audible rhonchi an.*comment was truncated* EXAM: CHEST  2 VIEW COMPARISON:  08/17/2017 FINDINGS: Midline trachea. Borderline cardiomegaly. Suspect trace right pleural fluid. Thickening of the right minor fissure. No pneumothorax. Moderate pulmonary interstitial prominence is greater on the right and slightly improved since the prior. Right greater than left perihilar airspace disease is also decreased. IMPRESSION: Slight decrease in right greater than left interstitial and airspace disease. Favor improving asymmetric pulmonary edema. Probable trace right pleural fluid. Electronically Signed   By: Abigail Miyamoto M.D.   On: 09/05/2017 16:51   US Renal  Result Date: 08/17/2017 CLINICAL DATA:  Acute kidney failure EXAM: RENAL / URINARY TRACT ULTRASOUND COMPLETE COMPARISON:  CT 05/20/2010 FINDINGS: Right Kidney: Length: 9.6 cm. 3 cm benign-appearing simple cyst in the upper pole. No hydronephrosis. Normal echotexture. Left Kidney: Length: 9.3 cm.  Normal echotexture.  No mass or hydronephrosis. Bladder: Appears normal for degree of bladder distention. IMPRESSION: No acute findings.  No hydronephrosis. Electronically Signed   By: Rolm Baptise M.D.   On: 08/17/2017 11:10   Dg Chest Port 1 View  Result Date: 08/17/2017 CLINICAL DATA:  Shortness of breath.  Pulmonary edema. EXAM: PORTABLE CHEST 1 VIEW COMPARISON:  08/16/2017.  08/15/2017. FINDINGS: Cardiomegaly with bilateral pulmonary infiltrates/edema, partially cleared from prior exam. Small bilateral pleural effusions. No pneumothorax. No acute bony abnormality . IMPRESSION: Cardiomegaly with bilateral  pulmonary infiltrates/edema, partially cleared from prior exams. Small bilateral pleural effusions again noted. Electronically Signed   By: Marcello Moores  Register   On: 08/17/2017 10:17   Dg Chest Port 1 View  Result Date: 08/16/2017 CLINICAL DATA:  Congestive heart failure EXAM: PORTABLE CHEST 1 VIEW COMPARISON:  08/15/2017 FINDINGS: Persistence and worsening of diffuse interstitial and alveolar edema pattern. Developing pleural effusions. No other change. IMPRESSION: Persistence and worsening of diffuse interstitial and alveolar edema. Developing pleural effusions. Electronically Signed  By: Nelson Chimes M.D.   On: 08/16/2017 06:37    EKG: Normal sinus rhythm  Weights: Filed Weights   09/05/17 1553 09/05/17 2100 09/06/17 0446  Weight: 91.6 kg (202 lb) 84.7 kg (186 lb 11.2 oz) 82.7 kg (182 lb 4.8 oz)     Physical Exam: Blood pressure 129/60, pulse 95, temperature 97.9 F (36.6 C), temperature source Oral, resp. rate 18, height 5\' 6"  (1.676 m), weight 82.7 kg (182 lb 4.8 oz), SpO2 97 %. Body mass index is 29.42 kg/m. General: Well developed, well nourished, in no acute distress. Head eyes ears nose throat: Normocephalic, atraumatic, sclera non-icteric, no xanthomas, nares are without discharge. No apparent thyromegaly and/or mass  Lungs: Normal respiratory effort.  no wheezes, 2 rales, no rhonchi.  Heart: RRR with normal S1 S2.  3-4+ apical murmur gallop, no rub, PMI is normal size and placement, carotid upstroke normal without bruit, jugular venous pressure is normal Abdomen: Soft, non-tender, non-distended with normoactive bowel sounds. No hepatomegaly. No rebound/guarding. No obvious abdominal masses. Abdominal aorta is normal size without bruit Extremities: 1+ edema. no cyanosis, no clubbing, no ulcers  Peripheral : 2+ bilateral upper extremity pulses, 2+ bilateral femoral pulses, 2+ bilateral dorsal pedal pulse Neuro: Alert and oriented. No facial asymmetry. No focal deficit. Moves all  extremities spontaneously. Musculoskeletal: Normal muscle tone without kyphosis Psych:  Responds to questions appropriately with a normal affect.    Assessment: 83 year old male with acute on chronic diastolic dysfunction congestive heart failure with moderate to severe mitral regurgitation possibly exacerbating this issue in addition to chronic kidney disease now significantly improved with intravenous Lasix without evidence of myocardial infarction  Plan: 1.  Continue diuresis with intravenous Lasix and change over to oral Lasix 2.  Continue carvedilol amlodipine and lisinopril combination for hypertension control left ventricular hypertrophy and diastolic dysfunction 3.  To new high intensity cholesterol therapy for further risk reduction cardiovascular event 4.  Patient is to be counseled and rehabilitated from congestive heart failure standpoint with follow-up in 1-2 weeks 5.  No further cardiac diagnostics necessary at this time  Signed, Corey Skains M.D. [facility] 09/06/2017, 12:55 PM

## 2017-09-06 NOTE — Progress Notes (Signed)
Doctor Salary ordered sliding scale for pt. Will continue to monitor.

## 2017-09-06 NOTE — Progress Notes (Signed)
Inpatient Diabetes Program Recommendations  AACE/ADA: New Consensus Statement on Inpatient Glycemic Control (2015)  Target Ranges:  Prepandial:   less than 140 mg/dL      Peak postprandial:   less than 180 mg/dL (1-2 hours)      Critically ill patients:  140 - 180 mg/dL   Lab Results  Component Value Date   GLUCAP 166 (H) 09/06/2017   HGBA1C 6.9 (H) 08/15/2017    Review of Glycemic Control  Results for [name], [name] (MRN [id number]) as of 09/06/2017 11:37  Ref. Range 09/05/2017 21:02 09/06/2017 09:07  Glucose-Capillary Latest Ref Range: 65 - 99 mg/dL 239 (H) 166 (H)    Diabetes history:Type 2 Outpatient Diabetes medications:Amaryl 1mg  qam, Actos 30 mg qday  Current orders for Inpatient glycemic control:  Inpatient Diabetes Program Recommendations: Please consider orderingNovolog 0-9 units tid, Novolog 0-5 units qhs. (please use Glycemic control order set  Ac/hs) to order- this will place safety parameters in the order  As recommended by the ADA, please d/c Amaryl while inpatient (poor renal function-high risk of hypoglycemia)  Gentry Fitz, RN, BA, MHA, CDE Diabetes Coordinator Inpatient Diabetes Program  [phone] (Team Pager) [phone] ([place]) 09/06/2017 11:42 AM

## 2017-09-06 NOTE — Plan of Care (Signed)
Continues to diurese. Breathing better, pain free.  Dr Marcille Blanco made aware of troponin 0.03.

## 2017-09-06 NOTE — Discharge Instructions (Signed)
Heart Failure Clinic appointment on September 14 2017 at 11:40am with Darylene Price, [place]. Please call [phone] to reschedule.

## 2017-09-07 LAB — GLUCOSE, CAPILLARY
GLUCOSE-CAPILLARY: 222 mg/dL — AB (ref 65–99)
Glucose-Capillary: 161 mg/dL — ABNORMAL HIGH (ref 65–99)

## 2017-09-07 NOTE — Care Management Important Message (Signed)
Important Message  Patient Details  Name: Bill Peterson MRN: [id number] Date of Birth: 05/24/1935   Medicare Important Message Given:  N/A - LOS <3 / Initial given by admissions    Katrina Stack, RN 09/07/2017, 3:19 PM

## 2017-09-07 NOTE — Plan of Care (Signed)
  Activity: Capacity to carry out activities will improve 09/07/2017 1341 - Adequate for Discharge by Alen Blew, RN   Cardiac: Ability to achieve and maintain adequate cardiopulmonary perfusion will improve 09/07/2017 1341 - Adequate for Discharge by Alen Blew, RN

## 2017-09-07 NOTE — Consult Note (Signed)
Provided patient with "Living Better with Heart Failure" packet. Briefly reviewed definition of heart failure and signs and symptoms of an exacerbation. Reviewed importance of and reason behind checking weight daily in the AM, after using the bathroom, but before getting dressed. Discussed when to call the Dr= weight gain of >2lb overnight of 5lb in a week,  Discussed yellow zone= call MD: weight gain of >2lb overnight of 5lb in a week, increased swelling, increased SOB when lying down, chest discomfort, dizziness, increased fatigue Red Zone= call 911: struggle to breath, fainting or near fainting, significant chest pain Reviewed low sodium diet <2g/day-provided handout of recommended and not recommended foods  Fluid restriction <2L/day Pt was concerned about dehydration in summer with being out doors, reviewed signs of dehydration and explained that if he was going to be doing something that he would be sweating a lot and loosing fluids then it was ok to drink a little bit more water  Explained briefly why pt is on the medications (either make you feel better, live longer or keep you out of the hospital) and discussed monitoring and side effects Lisinopril, carvedilol, toresimide Pt states he has stopped taking imdur due to side effect, this was marked on his admission med rec. Medication was continued at d/c. Told pt was ok not to take and to discuss with cardiology/Tina at follow up. Discussed tobacco cessation: pt does not smoke Discussed exercise: pt is more active, mows lawn, walks in summer time Also encouraged pt to talk about actos with his primary care dr as this can increase fluid retention.  Ramond Dial, Pharm.D, BCPS Clinical Pharmacist

## 2017-09-07 NOTE — Care Management (Signed)
Patient is not going to require home 02. He declines the need for home health nurse

## 2017-09-07 NOTE — Discharge Summary (Addendum)
[facility] [place] NAME: Bill Peterson    MR#:  [id number]  DATE OF BIRTH:  01/29/1935  DATE OF ADMISSION:  09/05/2017 ADMITTING PHYSICIAN: Vaughan Basta, MD  DATE OF DISCHARGE: No discharge date for patient encounter.  PRIMARY CARE PHYSICIAN: Kirk Ruths, MD    ADMISSION DIAGNOSIS:  Urinary retention [R33.9] CHF (congestive heart failure) (HCC) [I50.9] Hypoxia [R09.02] Acute on chronic congestive heart failure, unspecified heart failure type ([place]) [I50.9]  DISCHARGE DIAGNOSIS:  Principal Problem:   Acute CHF (congestive heart failure) (HCC) Active Problems:   Acute respiratory failure ([place])   SECONDARY DIAGNOSIS:   Past Medical History:  Diagnosis Date  . BPH (benign prostatic hyperplasia)   . CHF (congestive heart failure) ([place])   . Diabetes mellitus without complication ([place])   . Hyperlipidemia   . Hypertension     HOSPITAL COURSE:   1 acute hypoxic respiratory failure Secondary to acute on chronic diastolic congestive heart failure exacerbation Resolved Supplemental oxygen as needed  2 acute on chronic diastolic congestive heart failure exacerbation Resolved Cardiology did see patient while in house, placed on our congestive heart failure protocol, received IV Lasix, placed on low-sodium/cardiac diet, strict I&O monitoring, daily weights, and the patient did well  Most recent echocardiogram from December 2018 noted for diastolic dysfunction stage I with normal ejection fraction  3 hx of hematuria, BPH Stable Foley was continued given continued urinary retention will follow up with urology status post discharge for reevaluation in 1 week Continued Flomax  4 acute on chronic urinary retention Continued Flomax and chronic indwelling Foley  5 chronic benign essential hypertension Controlled on current regiment  6 chronic hyperlipidemia, unspecified Continue statin therapy  7 CKD  stage 3 Appears stable    DISCHARGE CONDITIONS:   On day of discharge patient is afebrile, he is now stable, tolerating diet, ready for discharge home with appropriate follow-up with primary care provider in 3-5 days, congestive heart failure clinic status post discharge, urology in 1 week for acute urinary retention with chronic indwelling Foley  CONSULTS OBTAINED:  Treatment Team:  Corey Skains, MD  DRUG ALLERGIES:   Allergies  Allergen Reactions  . Heparin     REACTION: Unspecified  . Repaglinide Other (See Comments)    Increased appetite Increased appetite Increased appetite     DISCHARGE MEDICATIONS:   Allergies as of 09/07/2017      Reactions   Heparin    REACTION: Unspecified   Repaglinide Other (See Comments)   Increased appetite Increased appetite Increased appetite      Medication List    STOP taking these medications   furosemide 40 MG tablet Commonly known as:  LASIX     TAKE these medications   amLODipine 5 MG tablet Commonly known as:  NORVASC Take 5 mg by mouth daily.   aspirin 325 MG tablet Take 325 mg by mouth daily.   atorvastatin 20 MG tablet Commonly known as:  LIPITOR Take 20 mg by mouth daily.   carvedilol 6.25 MG tablet Commonly known as:  COREG Take 1 tablet (6.25 mg total) by mouth 2 (two) times daily with a meal. What changed:  how much to take   glimepiride 1 MG tablet Commonly known as:  AMARYL Take 1 mg by mouth daily with breakfast.   isosorbide mononitrate 30 MG 24 hr tablet Commonly known as:  IMDUR Take 1 tablet (30 mg total) by mouth daily.   lisinopril 20 MG tablet  Commonly known as:  PRINIVIL,ZESTRIL Take 20 mg by mouth daily.   multivitamin capsule Take 1 capsule by mouth daily.   omeprazole 20 MG capsule Commonly known as:  PRILOSEC Take 20 mg by mouth daily.   pioglitazone 30 MG tablet Commonly known as:  ACTOS Take 30 mg by mouth daily.   tamsulosin 0.4 MG Caps capsule Commonly known as:   FLOMAX Take 1 capsule (0.4 mg total) by mouth daily.   torsemide 20 MG tablet Commonly known as:  DEMADEX Take 20 mg by mouth daily.   VITAMIN C & E COMBINATION 500-400 MG-UNIT Caps Take 1 capsule by mouth daily.        DISCHARGE INSTRUCTIONS:    If you experience worsening of your admission symptoms, develop shortness of breath, life threatening emergency, suicidal or homicidal thoughts you must seek medical attention immediately by calling 911 or calling your MD immediately  if symptoms less severe.  You Must read complete instructions/literature along with all the possible adverse reactions/side effects for all the Medicines you take and that have been prescribed to you. Take any new Medicines after you have completely understood and accept all the possible adverse reactions/side effects.   Please note  You were cared for by a hospitalist during your hospital stay. If you have any questions about your discharge medications or the care you received while you were in the hospital after you are discharged, you can call the unit and asked to speak with the hospitalist on call if the hospitalist that took care of you is not available. Once you are discharged, your primary care physician will handle any further medical issues. Please note that NO REFILLS for any discharge medications will be authorized once you are discharged, as it is imperative that you return to your primary care physician (or establish a relationship with a primary care physician if you do not have one) for your aftercare needs so that they can reassess your need for medications and monitor your lab values.    Today   CHIEF COMPLAINT:   Chief Complaint  Patient presents with  . Shortness of Breath  . Urinary Retention    HISTORY OF PRESENT ILLNESS:  HISTORY OF PRESENT ILLNESS: Bill Peterson  is a 83 y.o. male with a known history of BPH, CHF, Htn, HLD- was admitted 2 weeks ago for CHF, also noted BPH and sent  home with foley , follow up in urology clinic. Today went to Urology clinic and they took cath out in morning, told him to drink a lot of water and come back at 3 pm to check, if he is urinating. He drank 3 bottles 16 Oz water, went back- was noted SOB and hypoxic, so sent to ER> Noted Hypoxic, started on lasix and foley placed again.     VITAL SIGNS:  Blood pressure (!) 125/54, pulse 76, temperature 98.9 F (37.2 C), temperature source Oral, resp. rate 20, height 5\' 6"  (1.676 m), weight 81 kg (178 lb 8 oz), SpO2 96 %.  I/O:    Intake/Output Summary (Last 24 hours) at 09/07/2017 1406 Last data filed at 09/07/2017 1400 Gross per 24 hour  Intake 483 ml  Output 1000 ml  Net -517 ml    PHYSICAL EXAMINATION:  GENERAL:  83 y.o.-year-old patient lying in the bed with no acute distress.  EYES: Pupils equal, round, reactive to light and accommodation. No scleral icterus. Extraocular muscles intact.  HEENT: Head atraumatic, normocephalic. Oropharynx and nasopharynx clear.  NECK:  Supple, no jugular venous distention. No thyroid enlargement, no tenderness.  LUNGS: Normal breath sounds bilaterally, no wheezing, rales,rhonchi or crepitation. No use of accessory muscles of respiration.  CARDIOVASCULAR: S1, S2 normal. No murmurs, rubs, or gallops.  ABDOMEN: Soft, non-tender, non-distended. Bowel sounds present. No organomegaly or mass.  EXTREMITIES: No pedal edema, cyanosis, or clubbing.  NEUROLOGIC: Cranial nerves II through XII are intact. Muscle strength 5/5 in all extremities. Sensation intact. Gait not checked.  PSYCHIATRIC: The patient is alert and oriented x 3.  SKIN: No obvious rash, lesion, or ulcer.   DATA REVIEW:   CBC Recent Labs  Lab 09/06/17 0645  WBC 4.8  HGB 10.5*  HCT 31.2*  PLT 193    Chemistries  Recent Labs  Lab 09/06/17 0645  NA 141  K 3.5  CL 104  CO2 28  GLUCOSE 137*  BUN 23*  CREATININE 1.76*  CALCIUM 8.6*    Cardiac Enzymes Recent Labs  Lab  09/06/17 0645  TROPONINI 0.03*    Microbiology Results  Results for orders placed or performed during the hospital encounter of 09/05/17  Urine culture     Status: Abnormal (Preliminary result)   Collection Time: 09/05/17  5:49 PM  Result Value Ref Range Status   Specimen Description   Final    URINE, RANDOM Performed at [facility], [address] [place], [place] [address]    Special Requests   Final    NONE Performed at [facility], [address], [place],  [address]    Culture 10,000 [place] (A)  Final   Report Status PENDING  Incomplete    RADIOLOGY:  Dg Chest 2 View  Result Date: 09/06/2017 CLINICAL DATA:  CHF. EXAM: CHEST  2 VIEW COMPARISON:  Chest x-ray from yesterday. FINDINGS: The cardiomediastinal silhouette remains borderline enlarged. Resolved pulmonary alveolar edema with mild residual right hilar peribronchial thickening. No focal consolidation, pleural effusion, or pneumothorax. No acute osseous abnormality. IMPRESSION: Nearly resolved pulmonary edema. Electronically Signed   By: Titus Dubin M.D.   On: 09/06/2017 07:35   Dg Chest 2 View  Result Date: 09/05/2017 CLINICAL DATA:  Pt sent for evaluation from urology. Pt was there for bladder scan and to evaluate urinary retention. Urology unable to complete exam due to dyspnea. Pt orthopneic, tachypneic and tachycardic in triage. Pt with audible rhonchi an.*comment was truncated* EXAM: CHEST  2 VIEW COMPARISON:  08/17/2017 FINDINGS: Midline trachea. Borderline cardiomegaly. Suspect trace right pleural fluid. Thickening of the right minor fissure. No pneumothorax. Moderate pulmonary interstitial prominence is greater on the right and slightly improved since the prior. Right greater than left perihilar airspace disease is also decreased. IMPRESSION: Slight decrease in right greater than left interstitial and airspace disease. Favor improving asymmetric pulmonary  edema. Probable trace right pleural fluid. Electronically Signed   By: Abigail Miyamoto M.D.   On: 09/05/2017 16:51    EKG:   Orders placed or performed during the hospital encounter of 09/05/17  . EKG 12-Lead  . EKG 12-Lead  . ED EKG within 10 minutes  . ED EKG within 10 minutes  . EKG 12-Lead  . EKG 12-Lead      Management plans discussed with the patient, family and they are in agreement.  CODE STATUS:     Code Status Orders  (From admission, onward)        Start     Ordered   09/05/17 2046  Full code  Continuous     09/05/17  2045    Code Status History    Date Active Date Inactive Code Status Order ID Comments User Context   08/15/2017 20:04 08/19/2017 14:38 Full Code [id number]  Gladstone Lighter, MD Inpatient      TOTAL TIME TAKING CARE OF THIS PATIENT: 45 minutes.    Avel Peace Salary M.D on 09/07/2017 at 2:06 PM  Between 7am to 6pm - Pager - [phone]  After 6pm go to [email or web address] - password [email or web address]  Office  [phone]  CC: Primary care physician; Kirk Ruths, MD   Note: This dictation was prepared with Dragon dictation along with smaller phrase technology. Any transcriptional errors that result from this process are unintentional.

## 2017-09-07 NOTE — Progress Notes (Signed)
Went over discharge instruction with the patient including medication and follow-up appointment. Discontinue peripheral IV and telemetry monitor. Waiting for wife to come here for his ride, will call volunteer for transport.

## 2017-09-07 NOTE — Progress Notes (Signed)
[facility] Cardiology [facility] Encounter Note  Patient: Bill Peterson / Admit Date: 09/05/2017 / Date of Encounter: 09/07/2017, 8:35 AM   Subjective: She is feeling much better.  Less shortness of breath.  No evidence of chest pain pressure or further shortness of breath.  Patient does have exam consistent with possible valvular heart disease and/or kidney disease involved with exacerbation.  No evidence of myocardial infarction at this time  Review of Systems: Positive for: None Negative for: Vision change, hearing change, syncope, dizziness, nausea, vomiting,diarrhea, bloody stool, stomach pain, cough, congestion, diaphoresis, urinary frequency, urinary pain,skin lesions, skin rashes Others previously listed  Objective: Telemetry: Normal sinus rhythm Physical Exam: Blood pressure (!) 125/54, pulse 76, temperature 98.9 F (37.2 C), temperature source Oral, resp. rate 20, height 5\' 6"  (1.676 m), weight 81 kg (178 lb 8 oz), SpO2 96 %. Body mass index is 28.81 kg/m. General: Well developed, well nourished, in no acute distress. Head: Normocephalic, atraumatic, sclera non-icteric, no xanthomas, nares are without discharge. Neck: No apparent masses Lungs: Normal respirations with no wheezes, no rhonchi, no rales , no crackles   Heart: Regular rate and rhythm, normal S1 S2, no murmur, no rub, no gallop, PMI is normal size and placement, carotid upstroke normal without bruit, jugular venous pressure normal Abdomen: Soft, non-tender, non-distended with normoactive bowel sounds. No hepatosplenomegaly. Abdominal aorta is normal size without bruit Extremities: No edema, no clubbing, no cyanosis, no ulcers,  Peripheral: 2+ radial, 2+ femoral, 2+ dorsal pedal pulses Neuro: Alert and oriented. Moves all extremities spontaneously. Psych:  Responds to questions appropriately with a normal affect.   Intake/Output Summary (Last 24 hours) at 09/07/2017 0835 Last data filed at 09/07/2017 0300 Gross per  24 hour  Intake 360 ml  Output 1000 ml  Net -640 ml    Inpatient Medications:  . amLODipine  5 mg Oral QPM  . atorvastatin  20 mg Oral QPM  . carvedilol  6.25 mg Oral BID WC  . furosemide  20 mg Intravenous Q8H  . glimepiride  1 mg Oral Q breakfast  . insulin aspart  0-5 Units Subcutaneous QHS  . insulin aspart  0-9 Units Subcutaneous TID WC  . lisinopril  20 mg Oral QPM  . multivitamin with minerals   Oral Daily  . pantoprazole  40 mg Oral Daily  . sodium chloride flush  3 mL Intravenous Q12H  . tamsulosin  0.4 mg Oral Daily  . vitamin C  500 mg Oral Daily   And  . vitamin E  400 Units Oral Daily   Infusions:   Labs: Recent Labs    09/05/17 1614 09/06/17 0645  NA 137 141  K 3.6 3.5  CL 100* 104  CO2 26 28  GLUCOSE 158* 137*  BUN 21* 23*  CREATININE 1.87* 1.76*  CALCIUM 8.9 8.6*   No results for input(s): AST, ALT, ALKPHOS, BILITOT, PROT, ALBUMIN in the last 72 hours. Recent Labs    09/05/17 1614 09/06/17 0645  WBC 10.1 4.8  HGB 12.3* 10.5*  HCT 36.9* 31.2*  MCV 94.4 92.6  PLT 231 193   Recent Labs    09/05/17 1614 09/05/17 2150 09/06/17 0215 09/06/17 0645  TROPONINI <0.03 <0.03 0.03* 0.03*   Invalid input(s): POCBNP No results for input(s): HGBA1C in the last 72 hours.   Weights: Filed Weights   09/05/17 2100 09/06/17 0446 09/07/17 0327  Weight: 84.7 kg (186 lb 11.2 oz) 82.7 kg (182 lb 4.8 oz) 81 kg (178 lb 8 oz)  Radiology/Studies:  Dg Chest 1 View  Result Date: 08/15/2017 CLINICAL DATA:  Severe shortness of breath, suspect pulmonary edema. History of previous MI, diabetes. EXAM: CHEST 1 VIEW COMPARISON:  Chest x-ray of November 03, 2017 FINDINGS: The lungs are well-expanded. The interstitial markings are diffusely increased. The pulmonary vascularity is engorged and indistinct. The cardiac silhouette is top-normal in size but not greatly changed. There is no pleural effusion. There is multilevel degenerative disc disease of the thoracic spine.  IMPRESSION: Diffusely increased pulmonary interstitial markings likely reflects pulmonary edema. Widespread interstitial pneumonia could produce similar findings. Electronically Signed   By: David  Martinique M.D.   On: 08/15/2017 16:41   Dg Chest 2 View  Result Date: 09/06/2017 CLINICAL DATA:  CHF. EXAM: CHEST  2 VIEW COMPARISON:  Chest x-ray from yesterday. FINDINGS: The cardiomediastinal silhouette remains borderline enlarged. Resolved pulmonary alveolar edema with mild residual right hilar peribronchial thickening. No focal consolidation, pleural effusion, or pneumothorax. No acute osseous abnormality. IMPRESSION: Nearly resolved pulmonary edema. Electronically Signed   By: Titus Dubin M.D.   On: 09/06/2017 07:35   Dg Chest 2 View  Result Date: 09/05/2017 CLINICAL DATA:  Pt sent for evaluation from urology. Pt was there for bladder scan and to evaluate urinary retention. Urology unable to complete exam due to dyspnea. Pt orthopneic, tachypneic and tachycardic in triage. Pt with audible rhonchi an.*comment was truncated* EXAM: CHEST  2 VIEW COMPARISON:  08/17/2017 FINDINGS: Midline trachea. Borderline cardiomegaly. Suspect trace right pleural fluid. Thickening of the right minor fissure. No pneumothorax. Moderate pulmonary interstitial prominence is greater on the right and slightly improved since the prior. Right greater than left perihilar airspace disease is also decreased. IMPRESSION: Slight decrease in right greater than left interstitial and airspace disease. Favor improving asymmetric pulmonary edema. Probable trace right pleural fluid. Electronically Signed   By: Abigail Miyamoto M.D.   On: 09/05/2017 16:51   US Renal  Result Date: 08/17/2017 CLINICAL DATA:  Acute kidney failure EXAM: RENAL / URINARY TRACT ULTRASOUND COMPLETE COMPARISON:  CT 05/20/2010 FINDINGS: Right Kidney: Length: 9.6 cm. 3 cm benign-appearing simple cyst in the upper pole. No hydronephrosis. Normal echotexture. Left Kidney:  Length: 9.3 cm.  Normal echotexture.  No mass or hydronephrosis. Bladder: Appears normal for degree of bladder distention. IMPRESSION: No acute findings.  No hydronephrosis. Electronically Signed   By: Rolm Baptise M.D.   On: 08/17/2017 11:10   Dg Chest Port 1 View  Result Date: 08/17/2017 CLINICAL DATA:  Shortness of breath.  Pulmonary edema. EXAM: PORTABLE CHEST 1 VIEW COMPARISON:  08/16/2017.  08/15/2017. FINDINGS: Cardiomegaly with bilateral pulmonary infiltrates/edema, partially cleared from prior exam. Small bilateral pleural effusions. No pneumothorax. No acute bony abnormality . IMPRESSION: Cardiomegaly with bilateral pulmonary infiltrates/edema, partially cleared from prior exams. Small bilateral pleural effusions again noted. Electronically Signed   By: Marcello Moores  Register   On: 08/17/2017 10:17   Dg Chest Port 1 View  Result Date: 08/16/2017 CLINICAL DATA:  Congestive heart failure EXAM: PORTABLE CHEST 1 VIEW COMPARISON:  08/15/2017 FINDINGS: Persistence and worsening of diffuse interstitial and alveolar edema pattern. Developing pleural effusions. No other change. IMPRESSION: Persistence and worsening of diffuse interstitial and alveolar edema. Developing pleural effusions. Electronically Signed   By: Nelson Chimes M.D.   On: 08/16/2017 06:37     Assessment and Recommendation  83 y.o. male with known diastolic dysfunction congestive heart failure with acute on chronic diastolic dysfunction heart failure with valvular heart disease essential hypertension mixed hyperlipidemia chronic kidney disease  now significantly improved after appropriate medication management without evidence of myocardial infarction 1.  Continue diuresis with oral Lasix  2.  Beta-blocker ACE inhibitor for diastolic dysfunction heart failure 3.  High intensity cholesterol therapy 4.  Begin ambulation and follow for improvements of symptoms and further adjustments of medications as necessary 5.  Okay for discharge home  from cardiac standpoint with follow-up with Dr. call would in 1-2 weeks for further adjustments of medications  Signed, Serafina Royals M.D. FACC

## 2017-09-07 NOTE — Progress Notes (Signed)
Upon assessment this morning pt stated that "things seem off today" "everyone seems to be in a rush," I acknowledged pt complaints, sat down in room to speak with him ans listen and asked if there was anything that I could do to make make his experience better today, he denies any offerings from this RN. Pt did say that cardiologist did not introduce self this morning upon entering room. I explained to patient the plan for the day and the steps involved when discharged and how that will work for him.

## 2017-09-08 LAB — URINE CULTURE

## 2017-09-10 NOTE — Telephone Encounter (Signed)
Patient has an appointment on 1/18 at 11:40 Am

## 2017-09-12 NOTE — Progress Notes (Signed)
Patient ID: Bill Peterson, male    DOB: 25-Sep-1934, 83 y.o.   MRN: [id number]  HPI  Bill Peterson is an 83 y/o male with a history of diabetes, hyperlipidemia, HTN, BPH and chronic heart failure.   Echo report from 08/16/17 reviewed and showed an EF of 65-70% along with mild AR and moderate Bill.   Admitted 09/05/17 due to HF exacerbation. Cardiology consult done. Initially needed IV diuretics and then transitioned to oral diuretics. Discharged after 2 days. Admitted 08/15/17 due to new onset heart failure. Cardiology and nephrology consults obtained. Discharged after 4 days.   He presents today for his initial visit with a chief complaint of minimal fatigue upon moderate exertion. He says this has been present for months with varying levels of severity. He has associated head congestion, shortness of breath, dizziness and edema along with this. He denies any chest pain, palpitations, abdominal distention or difficulty sleeping.   Past Medical History:  Diagnosis Date  . BPH (benign prostatic hyperplasia)   . CHF (congestive heart failure) ([place])   . Diabetes mellitus without complication ([place])   . Hyperlipidemia   . Hypertension    Past Surgical History:  Procedure Laterality Date  . BACK SURGERY    . CARDIAC SURGERY    . STOMACH SURGERY     Family History  Problem Relation Age of Onset  . Heart disease Mother   . Diabetes Father   . Prostate cancer Neg Hx   . Kidney cancer Neg Hx   . Bladder Cancer Neg Hx    Social History   Tobacco Use  . Smoking status: Never Smoker  . Smokeless tobacco: Never Used  Substance Use Topics  . Alcohol use: No   Allergies  Allergen Reactions  . Heparin     REACTION: Unspecified  . Repaglinide Other (See Comments)    Increased appetite Increased appetite Increased appetite    Prior to Admission medications   Medication Sig Start Date End Date Taking? Authorizing Provider  amLODipine (NORVASC) 5 MG tablet Take 5 mg by mouth daily.    Yes  [provider]  aspirin 325 MG tablet Take 325 mg by mouth daily.     Yes [provider]  atorvastatin (LIPITOR) 20 MG tablet Take 20 mg by mouth daily.   Yes [provider]  carvedilol (COREG) 6.25 MG tablet Take 1 tablet (6.25 mg total) by mouth 2 (two) times daily with a meal. 08/19/17  Yes [name], MD  glimepiride (AMARYL) 1 MG tablet Take 1 mg by mouth daily with breakfast.   Yes [provider]  isosorbide mononitrate (IMDUR) 30 MG 24 hr tablet Take 1 tablet (30 mg total) by mouth daily. 08/19/17  Yes [name], MD  lisinopril (PRINIVIL,ZESTRIL) 20 MG tablet Take 20 mg by mouth daily.   Yes [provider]  Multiple Vitamin (MULTIVITAMIN) capsule Take 1 capsule by mouth daily.     Yes [provider]  omeprazole (PRILOSEC) 20 MG capsule Take 20 mg by mouth daily.   Yes [provider]  tamsulosin (FLOMAX) 0.4 MG CAPS capsule Take 1 capsule (0.4 mg total) by mouth daily. 08/19/17  Yes [name], MD  torsemide (DEMADEX) 20 MG tablet Take 20 mg by mouth daily.   Yes [provider]  Vitamins C E (VITAMIN C & E COMBINATION) 500-400 MG-UNIT CAPS Take 1 capsule by mouth daily.    Yes [provider]   Review of Systems  Constitutional: Positive  for fatigue. Negative for appetite change.  HENT: Positive for congestion. Negative for postnasal drip and sore throat.   Eyes: Negative.   Respiratory: Positive for shortness of breath. Negative for chest tightness.   Cardiovascular: Positive for leg swelling. Negative for chest pain and palpitations.  Gastrointestinal: Negative for abdominal distention and abdominal pain.  Endocrine: Negative.   Genitourinary: Positive for difficulty urinating (currently has foley catheter ).  Musculoskeletal: Negative for back pain and neck pain.  Skin: Negative.   Allergic/Immunologic: Negative.   Neurological: Positive for dizziness. Negative for light-headedness.   Hematological: Negative for adenopathy. Does not bruise/bleed easily.  Psychiatric/Behavioral: Negative for dysphoric mood and sleep disturbance. The patient is not nervous/anxious.    Vitals:   09/14/17 1148  BP: (!) 121/56  Pulse: 91  Resp: 20  Temp: 98.2 F (36.8 C)  SpO2: 100%  Weight: 191 lb (86.6 kg)  Height: 5\' 6"  (1.676 m)   Wt Readings from Last 3 Encounters:  09/14/17 191 lb (86.6 kg)  09/07/17 178 lb 8 oz (81 kg)  09/05/17 202 lb (91.6 kg)   Lab Results  Component Value Date   CREATININE 1.76 (H) 09/06/2017   CREATININE 1.87 (H) 09/05/2017   CREATININE 2.01 (H) 08/18/2017   Physical Exam  Constitutional: He is oriented to person, place, and time. He appears well-developed and well-nourished.  HENT:  Head: Normocephalic and atraumatic.  Neck: Normal range of motion. Neck supple. No JVD present.  Cardiovascular: Normal rate. An irregular rhythm present.  Pulmonary/Chest: Effort normal. No respiratory distress. He has no wheezes. He has no rales.  Abdominal: Soft. He exhibits no distension. There is no tenderness.  Musculoskeletal: He exhibits edema (2+ pitting edema bilateral lower legs). He exhibits no tenderness.  Neurological: He is alert and oriented to person, place, and time.  Skin: Skin is warm and dry.  Psychiatric: He has a normal mood and affect. His behavior is normal. Thought content normal.  Nursing note and vitals reviewed.  Assessment & Plan:  1: Chronic heart failure with preserved ejection fraction- - NYHA class II - mildly fluid overloaded today - weighing daily and he was instructed to call for an overnight weight gain of >2 pounds or a weekly weight gain of >5 pounds - not adding salt and is using Mrs Deliah Boston seasoning instead. Reviewed the importance of following a 2000mg  sodium diet and written dietary information was given to them about this - BNP from 09/05/17 was 274.0 - saw cardiology Clayborn Bigness) 08/29/17 - patient reports receiving his flu  vaccine for this season  2: HTN- - BP looks good today - BMP from 09/06/17 reviewed and showed sodium 141, potassium 3.5 and GFR 40 - saw PCP Ouida Sills) earlier today  3: Diabetes-  - A1c from 08/15/17 was 6.9% - doesn't check glucose consistently  4: Urinary retention- - currently has a foley catheter in place - follows with urologist Elenor Quinones) on 09/21/17  5: Lymphedema- - stage 2 - not wearing support hose but does have RX for them. Instructed to wear them daily with removal at bedtime - doesn't elevate his legs much and he was encouraged to elevate them when he's sitting for long periods of time - currently not exercising much due to foley catheter - should edema persist after above therapies, could consider lymphapress compression boots  Medication bottles were reviewed.  Return in 1 month or sooner for any questions/problems before then.

## 2017-09-14 ENCOUNTER — Encounter: Payer: Self-pay | Admitting: Family

## 2017-09-14 ENCOUNTER — Other Ambulatory Visit: Payer: Self-pay

## 2017-09-14 ENCOUNTER — Ambulatory Visit: Payer: Medicare Other | Attending: Family | Admitting: Family

## 2017-09-14 VITALS — BP 121/56 | HR 91 | Temp 98.2°F | Resp 20 | Ht 66.0 in | Wt 191.0 lb

## 2017-09-14 DIAGNOSIS — R338 Other retention of urine: Secondary | ICD-10-CM | POA: Diagnosis not present

## 2017-09-14 DIAGNOSIS — I5032 Chronic diastolic (congestive) heart failure: Secondary | ICD-10-CM | POA: Diagnosis present

## 2017-09-14 DIAGNOSIS — Z7984 Long term (current) use of oral hypoglycemic drugs: Secondary | ICD-10-CM | POA: Diagnosis not present

## 2017-09-14 DIAGNOSIS — Z79899 Other long term (current) drug therapy: Secondary | ICD-10-CM | POA: Insufficient documentation

## 2017-09-14 DIAGNOSIS — Z7982 Long term (current) use of aspirin: Secondary | ICD-10-CM | POA: Diagnosis not present

## 2017-09-14 DIAGNOSIS — E119 Type 2 diabetes mellitus without complications: Secondary | ICD-10-CM | POA: Insufficient documentation

## 2017-09-14 DIAGNOSIS — E785 Hyperlipidemia, unspecified: Secondary | ICD-10-CM | POA: Insufficient documentation

## 2017-09-14 DIAGNOSIS — I10 Essential (primary) hypertension: Secondary | ICD-10-CM

## 2017-09-14 DIAGNOSIS — R339 Retention of urine, unspecified: Secondary | ICD-10-CM

## 2017-09-14 DIAGNOSIS — I110 Hypertensive heart disease with heart failure: Secondary | ICD-10-CM | POA: Diagnosis not present

## 2017-09-14 DIAGNOSIS — N401 Enlarged prostate with lower urinary tract symptoms: Secondary | ICD-10-CM | POA: Insufficient documentation

## 2017-09-14 DIAGNOSIS — I890 Lymphedema, not elsewhere classified: Secondary | ICD-10-CM

## 2017-09-14 DIAGNOSIS — N183 Chronic kidney disease, stage 3 (moderate): Secondary | ICD-10-CM

## 2017-09-14 DIAGNOSIS — E1122 Type 2 diabetes mellitus with diabetic chronic kidney disease: Secondary | ICD-10-CM

## 2017-09-14 NOTE — Patient Instructions (Signed)
Continue weighing daily and call for an overnight weight gain of > 2 pounds or a weekly weight gain of >5 pounds. 

## 2017-09-15 DIAGNOSIS — R339 Retention of urine, unspecified: Secondary | ICD-10-CM | POA: Insufficient documentation

## 2017-09-15 DIAGNOSIS — I5032 Chronic diastolic (congestive) heart failure: Secondary | ICD-10-CM | POA: Insufficient documentation

## 2017-09-15 DIAGNOSIS — I890 Lymphedema, not elsewhere classified: Secondary | ICD-10-CM | POA: Insufficient documentation

## 2017-09-19 ENCOUNTER — Encounter: Payer: Self-pay | Admitting: Urology

## 2017-09-19 ENCOUNTER — Ambulatory Visit (INDEPENDENT_AMBULATORY_CARE_PROVIDER_SITE_OTHER): Payer: Medicare Other | Admitting: Urology

## 2017-09-19 VITALS — BP 116/62 | HR 94 | Ht 66.0 in | Wt 188.1 lb

## 2017-09-19 DIAGNOSIS — R972 Elevated prostate specific antigen [PSA]: Secondary | ICD-10-CM | POA: Diagnosis not present

## 2017-09-19 DIAGNOSIS — R339 Retention of urine, unspecified: Secondary | ICD-10-CM | POA: Diagnosis not present

## 2017-09-19 LAB — BLADDER SCAN AMB NON-IMAGING: SCAN RESULT: 150

## 2017-09-19 MED ORDER — TAMSULOSIN HCL 0.4 MG PO CAPS: 0.4000 mg | ORAL_CAPSULE | Freq: Every day | ORAL | 6 refills | Status: DC

## 2017-09-19 NOTE — Progress Notes (Signed)
Fill and Pull Catheter Removal  Patient is present today for a catheter removal.  Patient was cleaned and prepped in a sterile fashion 223ml of sterile water/ saline was instilled into the bladder when the patient felt the urge to urinate. 18ml of water was then drained from the balloon.  A 14FR foley cath was removed from the bladder no complications were noted .  Patient as then given some time to void on their own.  Patient can void  250ml on their own after some time.  Patient tolerated well.  Preformed by: Elberta Leatherwood, CMA  Follow up/ Additional notes: this afternoon 3:00    Bladder Scan: 150 Patient can void:  Performed By: Toniann Fail, LPN

## 2017-09-19 NOTE — Progress Notes (Signed)
09/19/2017 4:26 PM   Bill Peterson Jan 11, 1935 [id number]  Referring provider: Kirk Ruths, MD [facility] [place], [place] [address]  Chief Complaint  Patient presents with  . Urinary Retention    HPI: 83 year old male presents for follow-up of urinary retention.  I last saw him on 08/15/2017 for follow-up of an elevated PSA.  A prostate MRI was recommended however he wanted to hold off.  He was hospitalized on 12/19 for CHF/pulmonary edema and developed urinary retention with diuresis.  He presented on 09/05/2017 for a voiding trial and voided a small amount.  He return for a follow-up bladder scan however with his hydration developed pulmonary edema and was hospitalized.  His Foley catheter was replaced.  He was discharged on 09/07/2017.   PMH: Past Medical History:  Diagnosis Date  . BPH (benign prostatic hyperplasia)   . CHF (congestive heart failure) ([place])   . Diabetes mellitus without complication ([place])   . Hyperlipidemia   . Hypertension     Surgical History: Past Surgical History:  Procedure Laterality Date  . BACK SURGERY    . CARDIAC SURGERY    . STOMACH SURGERY      Home Medications:  Allergies as of 09/19/2017      Reactions   Heparin    REACTION: Unspecified   Repaglinide Other (See Comments)   Increased appetite Increased appetite Increased appetite      Medication List        Accurate as of 09/19/17  4:26 PM. Always use your most recent med list.          amLODipine 5 MG tablet Commonly known as:  NORVASC Take 5 mg by mouth daily.   aspirin 325 MG tablet Take 325 mg by mouth daily.   atorvastatin 20 MG tablet Commonly known as:  LIPITOR Take 20 mg by mouth daily.   carvedilol 6.25 MG tablet Commonly known as:  COREG Take 1 tablet (6.25 mg total) by mouth 2 (two) times daily with a meal.   glimepiride 1 MG tablet Commonly known as:  AMARYL Take 1 mg by mouth daily with breakfast.   isosorbide  mononitrate 30 MG 24 hr tablet Commonly known as:  IMDUR Take 1 tablet (30 mg total) by mouth daily.   lisinopril 20 MG tablet Commonly known as:  PRINIVIL,ZESTRIL Take 20 mg by mouth daily.   multivitamin capsule Take 1 capsule by mouth daily.   omeprazole 20 MG capsule Commonly known as:  PRILOSEC Take 20 mg by mouth daily.   tamsulosin 0.4 MG Caps capsule Commonly known as:  FLOMAX Take 1 capsule (0.4 mg total) by mouth daily.   tamsulosin 0.4 MG Caps capsule Commonly known as:  FLOMAX Take 1 capsule (0.4 mg total) by mouth daily. Take 2 tabs by mouth daily   torsemide 20 MG tablet Commonly known as:  DEMADEX Take 20 mg by mouth daily.   VITAMIN C & E COMBINATION 500-400 MG-UNIT Caps Take 1 capsule by mouth daily.       Allergies:  Allergies  Allergen Reactions  . Heparin     REACTION: Unspecified  . Repaglinide Other (See Comments)    Increased appetite Increased appetite Increased appetite     Family History: Family History  Problem Relation Age of Onset  . Heart disease Mother   . Diabetes Father   . Prostate cancer Neg Hx   . Kidney cancer Neg Hx   . Bladder Cancer Neg  Hx     Social History:  reports that  has never smoked. he has never used smokeless tobacco. He reports that he does not drink alcohol or use drugs.  ROS: UROLOGY Frequent Urination?: No Hard to postpone urination?: No Burning/pain with urination?: No Get up at night to urinate?: No Leakage of urine?: No Urine stream starts and stops?: No Trouble starting stream?: No Do you have to strain to urinate?: No Blood in urine?: No Urinary tract infection?: No Sexually transmitted disease?: No Injury to kidneys or bladder?: No Painful intercourse?: No Weak stream?: No Erection problems?: No Penile pain?: No  Gastrointestinal Nausea?: No Vomiting?: No Indigestion/heartburn?: No Diarrhea?: No Constipation?: No  Constitutional Fever: No Night sweats?: No Weight loss?:  No Fatigue?: No  Skin Skin rash/lesions?: No Itching?: No  Eyes Blurred vision?: No Double vision?: No  Ears/Nose/Throat Sore throat?: No Sinus problems?: No  Hematologic/Lymphatic Swollen glands?: No Easy bruising?: No  Cardiovascular Leg swelling?: Yes Chest pain?: No  Respiratory Cough?: No Shortness of breath?: Yes  Endocrine Excessive thirst?: No  Musculoskeletal Back pain?: No Joint pain?: No  Neurological Headaches?: No Dizziness?: No  Psychologic Depression?: No Anxiety?: No  Physical Exam: BP 116/62   Pulse 94   Ht 5\' 6"  (1.676 m)   Wt 188 lb 1.6 oz (85.3 kg)   BMI 30.36 kg/m   Constitutional:  Alert and oriented, No acute distress. HEENT: [place] AT, moist mucus membranes.  Trachea midline, no masses. Cardiovascular: No clubbing, cyanosis, or edema. Respiratory: Normal respiratory effort, no increased work of breathing. Skin: No rashes, bruises or suspicious lesions. Lymph: No cervical or inguinal adenopathy. Neurologic: Grossly intact, no focal deficits, moving all 4 extremities. Psychiatric: Normal mood and affect.  Laboratory Data: Lab Results  Component Value Date   WBC 4.8 09/06/2017   HGB 10.5 (L) 09/06/2017   HCT 31.2 (L) 09/06/2017   MCV 92.6 09/06/2017   PLT 193 09/06/2017    Lab Results  Component Value Date   CREATININE 1.76 (H) 09/06/2017    Lab Results  Component Value Date   PSA1 8.3 (H) 08/15/2017    No results found for: TESTOSTERONE  Lab Results  Component Value Date   HGBA1C 6.9 (H) 08/15/2017    Urinalysis Lab Results  Component Value Date   APPEARANCEUR CLEAR (A) 09/05/2017   LEUKOCYTESUR NEGATIVE 09/05/2017   PROTEINUR NEGATIVE 09/05/2017   GLUCOSEU NEGATIVE 09/05/2017   RBCU TOO NUMEROUS TO COUNT 09/05/2017   BILIRUBINUR NEGATIVE 09/05/2017   NITRITE NEGATIVE 09/05/2017    Lab Results  Component Value Date   BACTERIA RARE (A) 09/05/2017    Pertinent Imaging:  Results for orders placed  during the hospital encounter of 08/15/17  US RENAL   Narrative CLINICAL DATA:  Acute kidney failure  EXAM: RENAL / URINARY TRACT ULTRASOUND COMPLETE  COMPARISON:  CT 05/20/2010  FINDINGS: Right Kidney:  Length: 9.6 cm. 3 cm benign-appearing simple cyst in the upper pole. No hydronephrosis. Normal echotexture.  Left Kidney:  Length: 9.3 cm.  Normal echotexture.  No mass or hydronephrosis.  Bladder:  Appears normal for degree of bladder distention.  IMPRESSION: No acute findings.  No hydronephrosis.   Electronically Signed   By: Rolm Baptise M.D.   On: 08/17/2017 11:10      Assessment & Plan:    1. Urinary retention He had a successful voiding trial voiding 225 mL of 250 mL instilled.  He was asked to return for a follow-up bladder scan this afternoon which  was 150 mL.  We will continue to monitor.  He was instructed to call for worsening voiding symptoms.  Recommend a follow-up bladder scan in approximately 2 weeks. Follow-up visit with me in 6 weeks.  Will increase tamsulosin to 0.8 mg  - BLADDER SCAN AMB NON-IMAGING  2.  Elevated PSA He states he is unable to travel to [facility] or [facility] for a prostate MRI and has elected surveillance.     Abbie Sons, [place] [address] [place], [place] [address] [phone]

## 2017-09-20 ENCOUNTER — Telehealth: Payer: Self-pay | Admitting: Urology

## 2017-09-20 NOTE — Telephone Encounter (Signed)
-----   Message from Abbie Sons, MD sent at 09/19/2017  4:33 PM EST ----- Please schedule follow-up nurse visit/bladder scan in 2 weeks and visit with me in 6 weeks.

## 2017-09-20 NOTE — Telephone Encounter (Signed)
apps made and mailed to patient  Bill Peterson

## 2017-09-21 ENCOUNTER — Ambulatory Visit: Payer: Medicare Other | Admitting: Urology

## 2017-10-02 ENCOUNTER — Ambulatory Visit (INDEPENDENT_AMBULATORY_CARE_PROVIDER_SITE_OTHER): Payer: Medicare Other

## 2017-10-02 VITALS — BP 133/76 | HR 83 | Ht 66.0 in | Wt 183.0 lb

## 2017-10-02 DIAGNOSIS — R339 Retention of urine, unspecified: Secondary | ICD-10-CM

## 2017-10-02 NOTE — Progress Notes (Signed)
PVR: 219. Pt refused to urinate prior to having bladder scan. Pt denied having any urological issues. Pt will RTC in March to see Dr. Bernardo Heater.  Blood pressure 133/76, pulse 83, height 5\' 6"  (1.676 m), weight 183 lb (83 kg).

## 2017-10-17 ENCOUNTER — Ambulatory Visit: Payer: Medicare Other | Attending: Family | Admitting: Family

## 2017-10-17 ENCOUNTER — Encounter: Payer: Self-pay | Admitting: Family

## 2017-10-17 VITALS — BP 117/64 | HR 78 | Resp 18 | Ht 66.0 in | Wt 182.5 lb

## 2017-10-17 DIAGNOSIS — E1122 Type 2 diabetes mellitus with diabetic chronic kidney disease: Secondary | ICD-10-CM

## 2017-10-17 DIAGNOSIS — I5032 Chronic diastolic (congestive) heart failure: Secondary | ICD-10-CM | POA: Diagnosis not present

## 2017-10-17 DIAGNOSIS — Z79899 Other long term (current) drug therapy: Secondary | ICD-10-CM | POA: Insufficient documentation

## 2017-10-17 DIAGNOSIS — Z8249 Family history of ischemic heart disease and other diseases of the circulatory system: Secondary | ICD-10-CM | POA: Insufficient documentation

## 2017-10-17 DIAGNOSIS — I110 Hypertensive heart disease with heart failure: Secondary | ICD-10-CM | POA: Insufficient documentation

## 2017-10-17 DIAGNOSIS — I890 Lymphedema, not elsewhere classified: Secondary | ICD-10-CM | POA: Insufficient documentation

## 2017-10-17 DIAGNOSIS — Z833 Family history of diabetes mellitus: Secondary | ICD-10-CM | POA: Diagnosis not present

## 2017-10-17 DIAGNOSIS — E785 Hyperlipidemia, unspecified: Secondary | ICD-10-CM | POA: Insufficient documentation

## 2017-10-17 DIAGNOSIS — Z888 Allergy status to other drugs, medicaments and biological substances status: Secondary | ICD-10-CM | POA: Insufficient documentation

## 2017-10-17 DIAGNOSIS — Z7982 Long term (current) use of aspirin: Secondary | ICD-10-CM | POA: Insufficient documentation

## 2017-10-17 DIAGNOSIS — N400 Enlarged prostate without lower urinary tract symptoms: Secondary | ICD-10-CM | POA: Insufficient documentation

## 2017-10-17 DIAGNOSIS — N183 Chronic kidney disease, stage 3 unspecified: Secondary | ICD-10-CM

## 2017-10-17 DIAGNOSIS — R0602 Shortness of breath: Secondary | ICD-10-CM | POA: Diagnosis not present

## 2017-10-17 DIAGNOSIS — E119 Type 2 diabetes mellitus without complications: Secondary | ICD-10-CM | POA: Diagnosis not present

## 2017-10-17 DIAGNOSIS — R42 Dizziness and giddiness: Secondary | ICD-10-CM | POA: Diagnosis not present

## 2017-10-17 DIAGNOSIS — Z9889 Other specified postprocedural states: Secondary | ICD-10-CM | POA: Insufficient documentation

## 2017-10-17 DIAGNOSIS — I10 Essential (primary) hypertension: Secondary | ICD-10-CM

## 2017-10-17 DIAGNOSIS — Z7984 Long term (current) use of oral hypoglycemic drugs: Secondary | ICD-10-CM | POA: Diagnosis not present

## 2017-10-17 LAB — GLUCOSE, CAPILLARY: Glucose-Capillary: 159 mg/dL — ABNORMAL HIGH (ref 65–99)

## 2017-10-17 NOTE — Patient Instructions (Signed)
Continue weighing daily and call for an overnight weight gain of > 2 pounds or a weekly weight gain of >5 pounds. 

## 2017-10-17 NOTE — Progress Notes (Signed)
Patient ID: Bill Peterson, male    DOB: 1935/01/05, 83 y.o.   MRN: [id number]  HPI  Mr Gritton is an 83 y/o male with a history of diabetes, hyperlipidemia, HTN, BPH and chronic heart failure.   Echo report from 08/16/17 reviewed and showed an EF of 65-70% along with mild AR and moderate MR.   Admitted 09/05/17 due to HF exacerbation. Cardiology consult done. Initially needed IV diuretics and then transitioned to oral diuretics. Discharged after 2 days. Admitted 08/15/17 due to new onset heart failure. Cardiology and nephrology consults obtained. Discharged after 4 days.   He presents today for a follow-up visit with a chief complaint of minimal shortness of breath upon moderate exertion. He says that this has been present for several years. He has associated fatigue, head congestion, edema and intermittent dizziness. He denies any chest pain, palpitations, abdominal distention, difficulty sleeping or weight gain. Recently saw his nephrologist.   Past Medical History:  Diagnosis Date  . BPH (benign prostatic hyperplasia)   . CHF (congestive heart failure) ([place])   . Diabetes mellitus without complication ([place])   . Hyperlipidemia   . Hypertension    Past Surgical History:  Procedure Laterality Date  . BACK SURGERY    . CARDIAC SURGERY    . STOMACH SURGERY     Family History  Problem Relation Age of Onset  . Heart disease Mother   . Diabetes Father   . Prostate cancer Neg Hx   . Kidney cancer Neg Hx   . Bladder Cancer Neg Hx    Social History   Tobacco Use  . Smoking status: Never Smoker  . Smokeless tobacco: Never Used  Substance Use Topics  . Alcohol use: No   Allergies  Allergen Reactions  . Heparin     REACTION: Unspecified  . Repaglinide Other (See Comments)    Increased appetite Increased appetite Increased appetite    Prior to Admission medications   Medication Sig Start Date End Date Taking? Authorizing Provider  amLODipine (NORVASC) 5 MG tablet Take 5 mg by  mouth daily.    Yes [provider]  aspirin 325 MG tablet Take 325 mg by mouth daily.     Yes [provider]  atorvastatin (LIPITOR) 20 MG tablet Take 20 mg by mouth daily.   Yes [provider]  carvedilol (COREG) 12.5 MG tablet Take 12.5 mg by mouth 2 (two) times daily with a meal.   Yes [provider]  glimepiride (AMARYL) 1 MG tablet Take 1 mg by mouth daily with breakfast.   Yes [provider]  lisinopril (PRINIVIL,ZESTRIL) 20 MG tablet Take 20 mg by mouth daily.   Yes [provider]  Multiple Vitamin (MULTIVITAMIN) capsule Take 1 capsule by mouth daily.     Yes [provider]  omeprazole (PRILOSEC) 20 MG capsule Take 20 mg by mouth daily.   Yes [provider]  tamsulosin (FLOMAX) 0.4 MG CAPS capsule Take 1 capsule (0.4 mg total) by mouth daily. Patient taking differently: Take 0.8 mg by mouth daily.  08/19/17  Yes [name], MD  torsemide (DEMADEX) 20 MG tablet Take 20 mg by mouth daily.   Yes [provider]  Vitamins C E (VITAMIN C & E COMBINATION) 500-400 MG-UNIT CAPS Take 1 capsule by mouth daily.    Yes [provider]  isosorbide mononitrate (IMDUR) 30 MG 24 hr tablet Take 1 tablet (30 mg total) by mouth daily. Patient not taking: Reported on 10/17/2017  08/19/17   [name], MD    Review of Systems  Constitutional: Positive for fatigue. Negative for appetite change.  HENT: Positive for congestion. Negative for postnasal drip and sore throat.   Eyes: Negative.   Respiratory: Positive for shortness of breath. Negative for chest tightness.   Cardiovascular: Positive for leg swelling. Negative for chest pain and palpitations.  Gastrointestinal: Negative for abdominal distention and abdominal pain.  Endocrine: Negative.   Genitourinary: Negative for difficulty urinating and hematuria.  Musculoskeletal: Negative for back pain and neck pain.  Skin: Negative.   Allergic/Immunologic:  Negative.   Neurological: Positive for dizziness (intermittent). Negative for light-headedness.  Hematological: Negative for adenopathy. Does not bruise/bleed easily.  Psychiatric/Behavioral: Negative for dysphoric mood and sleep disturbance. The patient is not nervous/anxious.    Vitals:   10/17/17 1003  BP: 117/64  Pulse: 78  Resp: 18  SpO2: 100%  Weight: 182 lb 8 oz (82.8 kg)  Height: 5\' 6"  (1.676 m)   Wt Readings from Last 3 Encounters:  10/17/17 182 lb 8 oz (82.8 kg)  10/02/17 183 lb (83 kg)  09/19/17 188 lb 1.6 oz (85.3 kg)   Lab Results  Component Value Date   CREATININE 1.76 (H) 09/06/2017   CREATININE 1.87 (H) 09/05/2017   CREATININE 2.01 (H) 08/18/2017    Physical Exam  Constitutional: He is oriented to person, place, and time. He appears well-developed and well-nourished.  HENT:  Head: Normocephalic and atraumatic.  Neck: Normal range of motion. Neck supple. No JVD present.  Cardiovascular: Normal rate and regular rhythm.  Pulmonary/Chest: Effort normal. No respiratory distress. He has no wheezes. He has no rales.  Abdominal: Soft. He exhibits no distension. There is no tenderness.  Musculoskeletal: He exhibits edema (1+ pitting edema bilateral lower legs). He exhibits no tenderness.  Neurological: He is alert and oriented to person, place, and time.  Skin: Skin is warm and dry.  Psychiatric: He has a normal mood and affect. His behavior is normal. Thought content normal.  Nursing note and vitals reviewed.  Assessment & Plan:  1: Chronic heart failure with preserved ejection fraction- - NYHA class II - mildly fluid overloaded today but stable - weighing daily and he was reminded to call for an overnight weight gain of >2 pounds or a weekly weight gain of >5 pounds - not adding salt and is using Mrs Deliah Boston seasoning instead. Reviewed the importance of following a 2000mg  sodium diet - BNP from 09/05/17 was 274.0 - saw cardiology Clayborn Bigness) 09/26/17 & returns in one  year - patient reports receiving his flu vaccine for this season; he has to get his pneumonia vaccine yet - PharmD reconciled medications with the patient  2: HTN- - BP looks good today - BMP from 09/06/17 reviewed and showed sodium 141, potassium 3.5 and GFR 40 - saw PCP Ouida Sills) 09/28/17  3: Diabetes-  - A1c from 08/15/17 was 6.9% - nonfasting glucose in clinic today was 159  4: Lymphedema- - stage 2 - wears compression socks but not daily; encouraged him to wear them daily and remove them at bedtime - has been elevating his legs more often with a reduction in his edema - should edema persist after above therapies, could consider lymphapress compression boots  Medication bottles were reviewed.  Return in 6 months or sooner for any questions/problems before then.

## 2017-10-31 ENCOUNTER — Encounter: Payer: Self-pay | Admitting: Urology

## 2017-10-31 ENCOUNTER — Ambulatory Visit (INDEPENDENT_AMBULATORY_CARE_PROVIDER_SITE_OTHER): Payer: Medicare Other | Admitting: Urology

## 2017-10-31 VITALS — BP 123/66 | HR 80 | Wt 179.5 lb

## 2017-10-31 DIAGNOSIS — R3914 Feeling of incomplete bladder emptying: Secondary | ICD-10-CM

## 2017-10-31 DIAGNOSIS — R339 Retention of urine, unspecified: Secondary | ICD-10-CM

## 2017-10-31 DIAGNOSIS — R972 Elevated prostate specific antigen [PSA]: Secondary | ICD-10-CM | POA: Diagnosis not present

## 2017-10-31 DIAGNOSIS — N401 Enlarged prostate with lower urinary tract symptoms: Secondary | ICD-10-CM

## 2017-10-31 LAB — BLADDER SCAN AMB NON-IMAGING: SCAN RESULT: 163

## 2017-10-31 NOTE — Progress Notes (Signed)
10/31/2017 2:32 PM   Bill Peterson March 16, 1935 [id number]  Referring provider: Kirk Ruths, MD [place] [place], [place] [address]  Chief Complaint  Patient presents with  . Follow-up    HPI: 83 year old male presents for follow-up of urinary retention.  Refer to my prior note of 09/19/2017.  At the time of his voiding trial his PVR was 150 mL.  He is on tamsulosin 0.8 mg daily and feels he is voiding well.  He has no voiding complaints.  Denies dysuria or gross hematuria.  He has a history of an elevated PSA with a bump in his PSA to 8.3.  He refused prostate MRI.   PMH: Past Medical History:  Diagnosis Date  . BPH (benign prostatic hyperplasia)   . CHF (congestive heart failure) ([place])   . Diabetes mellitus without complication ([place])   . Hyperlipidemia   . Hypertension     Surgical History: Past Surgical History:  Procedure Laterality Date  . BACK SURGERY    . CARDIAC SURGERY    . STOMACH SURGERY      Home Medications:  Allergies as of 10/31/2017      Reactions   Heparin    REACTION: Unspecified   Repaglinide Other (See Comments)   Increased appetite Increased appetite Increased appetite      Medication List        Accurate as of 10/31/17  2:32 PM. Always use your most recent med list.          amLODipine 5 MG tablet Commonly known as:  NORVASC Take 5 mg by mouth daily.   aspirin 325 MG tablet Take 325 mg by mouth daily.   atorvastatin 20 MG tablet Commonly known as:  LIPITOR Take 20 mg by mouth daily.   carvedilol 12.5 MG tablet Commonly known as:  COREG Take 12.5 mg by mouth 2 (two) times daily with a meal.   glimepiride 1 MG tablet Commonly known as:  AMARYL Take 1 mg by mouth daily with breakfast.   lisinopril 20 MG tablet Commonly known as:  PRINIVIL,ZESTRIL Take 20 mg by mouth daily.   multivitamin capsule Take 1 capsule by mouth daily.   omeprazole 20 MG capsule Commonly known as:   PRILOSEC Take 20 mg by mouth daily.   tamsulosin 0.4 MG Caps capsule Commonly known as:  FLOMAX Take 1 capsule (0.4 mg total) by mouth daily.   torsemide 20 MG tablet Commonly known as:  DEMADEX Take 20 mg by mouth daily.   VITAMIN C & E COMBINATION 500-400 MG-UNIT Caps Take 1 capsule by mouth daily.       Allergies:  Allergies  Allergen Reactions  . Heparin     REACTION: Unspecified  . Repaglinide Other (See Comments)    Increased appetite Increased appetite Increased appetite     Family History: Family History  Problem Relation Age of Onset  . Heart disease Mother   . Diabetes Father   . Prostate cancer Neg Hx   . Kidney cancer Neg Hx   . Bladder Cancer Neg Hx     Social History:  reports that  has never smoked. he has never used smokeless tobacco. He reports that he does not drink alcohol or use drugs.  ROS: UROLOGY Frequent Urination?: No Hard to postpone urination?: No Burning/pain with urination?: No Get up at night to urinate?: No Leakage of urine?: No Urine stream starts and stops?: No Trouble starting stream?: No Do you  have to strain to urinate?: No Blood in urine?: No Urinary tract infection?: No Sexually transmitted disease?: No Injury to kidneys or bladder?: Yes Painful intercourse?: No Weak stream?: No Erection problems?: No Penile pain?: No  Gastrointestinal Nausea?: No Vomiting?: No Indigestion/heartburn?: No Diarrhea?: No Constipation?: No  Constitutional Fever: No Night sweats?: No Weight loss?: No Fatigue?: No  Skin Skin rash/lesions?: No Itching?: No  Eyes Blurred vision?: No Double vision?: No  Ears/Nose/Throat Sore throat?: No Sinus problems?: No  Hematologic/Lymphatic Swollen glands?: No Easy bruising?: No  Cardiovascular Leg swelling?: No Chest pain?: No  Respiratory Cough?: No Shortness of breath?: Yes  Endocrine Excessive thirst?: No  Musculoskeletal Back pain?: No Joint pain?:  No  Neurological Headaches?: No Dizziness?: No  Psychologic Depression?: No Anxiety?: No  Physical Exam: BP 123/66 (BP Location: Right Arm, Patient Position: Sitting, Cuff Size: Normal)   Pulse 80   Wt 179 lb 8 oz (81.4 kg)   BMI 28.97 kg/m   Constitutional:  Alert and oriented, No acute distress. HEENT: [place] AT, moist mucus membranes.  Trachea midline, no masses. Cardiovascular: No clubbing, cyanosis, or edema. Respiratory: Normal respiratory effort, no increased work of breathing. GI: Abdomen is soft, nontender, nondistended, no abdominal masses GU: No CVA tenderness Lymph: No cervical or inguinal lymphadenopathy. Skin: No rashes, bruises or suspicious lesions. Neurologic: Grossly intact, no focal deficits, moving all 4 extremities. Psychiatric: Normal mood and affect.  Laboratory Data: Lab Results  Component Value Date   WBC 4.8 09/06/2017   HGB 10.5 (L) 09/06/2017   HCT 31.2 (L) 09/06/2017   MCV 92.6 09/06/2017   PLT 193 09/06/2017    Lab Results  Component Value Date   CREATININE 1.76 (H) 09/06/2017     Lab Results  Component Value Date   HGBA1C 6.9 (H) 08/15/2017     Assessment & Plan:   PVR by bladder scan today was 163 mL.  He will continue tamsulosin.  PSA repeated today and he will be notified with results and further recommendations.  If stable follow-up in 6 months for symptom check and repeat bladder scan.   Abbie Sons, [place] [address] [place], [place] [address] [phone]

## 2017-11-01 LAB — PSA: PROSTATE SPECIFIC AG, SERUM: 9.2 ng/mL — AB (ref 0.0–4.0)

## 2017-11-02 ENCOUNTER — Telehealth: Payer: Self-pay

## 2017-11-02 NOTE — Telephone Encounter (Signed)
LMOM

## 2017-11-02 NOTE — Telephone Encounter (Signed)
-----   Message from Abbie Sons, MD sent at 11/01/2017  7:17 AM EST ----- PSA has increased to 9.2-recommend scheduling a prostate MRI we had discussed

## 2017-11-05 NOTE — Telephone Encounter (Signed)
The last time I spoke with him regarding his MRI patient refused to have it at this time.   Bill Peterson

## 2017-11-05 NOTE — Telephone Encounter (Signed)
Spoke w/ pt wife Trevor Mace about pt PSA results. She is aware of results and will inform pt. She states pt is aware he will need MRI and is awaiting the call for scheduling.   Cristie Hem, CMA

## 2017-11-06 NOTE — Telephone Encounter (Signed)
At his recent visit he told me he was concerned about his PSA and would have the MRI if it was still elevated.

## 2018-04-16 ENCOUNTER — Encounter: Payer: Self-pay | Admitting: Family

## 2018-04-16 ENCOUNTER — Ambulatory Visit: Payer: Medicare Other | Attending: Family | Admitting: Family

## 2018-04-16 VITALS — BP 140/63 | HR 86 | Resp 18 | Ht 66.0 in | Wt 182.4 lb

## 2018-04-16 DIAGNOSIS — I890 Lymphedema, not elsewhere classified: Secondary | ICD-10-CM | POA: Diagnosis not present

## 2018-04-16 DIAGNOSIS — Z888 Allergy status to other drugs, medicaments and biological substances status: Secondary | ICD-10-CM | POA: Diagnosis not present

## 2018-04-16 DIAGNOSIS — I110 Hypertensive heart disease with heart failure: Secondary | ICD-10-CM | POA: Insufficient documentation

## 2018-04-16 DIAGNOSIS — Z7982 Long term (current) use of aspirin: Secondary | ICD-10-CM | POA: Insufficient documentation

## 2018-04-16 DIAGNOSIS — E119 Type 2 diabetes mellitus without complications: Secondary | ICD-10-CM | POA: Insufficient documentation

## 2018-04-16 DIAGNOSIS — I10 Essential (primary) hypertension: Secondary | ICD-10-CM

## 2018-04-16 DIAGNOSIS — N400 Enlarged prostate without lower urinary tract symptoms: Secondary | ICD-10-CM | POA: Diagnosis not present

## 2018-04-16 DIAGNOSIS — I5032 Chronic diastolic (congestive) heart failure: Secondary | ICD-10-CM | POA: Insufficient documentation

## 2018-04-16 DIAGNOSIS — Z79899 Other long term (current) drug therapy: Secondary | ICD-10-CM | POA: Diagnosis not present

## 2018-04-16 DIAGNOSIS — Z7984 Long term (current) use of oral hypoglycemic drugs: Secondary | ICD-10-CM | POA: Insufficient documentation

## 2018-04-16 DIAGNOSIS — E1122 Type 2 diabetes mellitus with diabetic chronic kidney disease: Secondary | ICD-10-CM

## 2018-04-16 DIAGNOSIS — E785 Hyperlipidemia, unspecified: Secondary | ICD-10-CM | POA: Diagnosis not present

## 2018-04-16 DIAGNOSIS — N183 Chronic kidney disease, stage 3 (moderate): Secondary | ICD-10-CM

## 2018-04-16 LAB — GLUCOSE, CAPILLARY: Glucose-Capillary: 255 mg/dL — ABNORMAL HIGH (ref 70–99)

## 2018-04-16 NOTE — Progress Notes (Signed)
Patient ID: Bill Peterson, male    DOB: Dec 04, 1934, 83 y.o.   MRN: [id number]  HPI  Bill Peterson is an 83 y/o male with a history of diabetes, hyperlipidemia, HTN, BPH and chronic heart failure.   Echo report from 08/16/17 reviewed and showed an EF of 65-70% along with mild AR and moderate Bill.   Has not been admitted or been in the ED in the last 6 months.   He presents today for a follow-up visit with a chief complaint of minimal shortness of breath upon moderate exertion. He describes this as chronic in nature having been present for several years. He has associated fatigue, pedal edema and intermittent dizziness along with this. He denies any difficulty sleeping, abdominal distention, palpitations, chest pain or weight gain.   Past Medical History:  Diagnosis Date  . BPH (benign prostatic hyperplasia)   . CHF (congestive heart failure) ([place] Peterson)   . Diabetes mellitus without complication ([place])   . Hyperlipidemia   . Hypertension    Past Surgical History:  Procedure Laterality Date  . BACK SURGERY    . CARDIAC SURGERY    . STOMACH SURGERY     Family History  Problem Relation Age of Onset  . Heart disease Mother   . Diabetes Father   . Prostate cancer Neg Hx   . Kidney cancer Neg Hx   . Bladder Cancer Neg Hx    Social History   Tobacco Use  . Smoking status: Never Smoker  . Smokeless tobacco: Never Used  Substance Use Topics  . Alcohol use: No   Allergies  Allergen Reactions  . Heparin     REACTION: Unspecified  . Repaglinide Other (See Comments)    Increased appetite Increased appetite Increased appetite    Prior to Admission medications   Medication Sig Start Date End Date Taking? Authorizing Provider  amLODipine (NORVASC) 5 MG tablet Take 5 mg by mouth daily.    Yes [provider]  aspirin 325 MG tablet Take 325 mg by mouth daily.     Yes [provider]  atorvastatin (LIPITOR) 20 MG tablet Take 20 mg by mouth daily.   Yes [provider]  carvedilol (COREG) 12.5 MG tablet Take 12.5 mg by mouth 2 (two) times daily with a meal.   Yes [provider]  CINNAMON PO Take 1,000 tablets by mouth daily at 6 (six) AM.   Yes [provider]  Garlic 6433 MG CAPS Take 1,000 mg by mouth daily at 6 (six) AM.   Yes [provider]  Ginger, Zingiber officinalis, (GINGER ROOT) 550 MG CAPS Take 1 capsule by mouth daily at 6 (six) AM.   Yes [provider]  glimepiride (AMARYL) 1 MG tablet Take 1 mg by mouth daily with breakfast.   Yes [provider]  lisinopril (PRINIVIL,ZESTRIL) 20 MG tablet Take 20 mg by mouth daily.   Yes [provider]  magnesium oxide (MAG-OX) 400 MG tablet Take 500 mg by mouth daily.   Yes [provider]  Multiple Vitamin (MULTIVITAMIN) capsule Take 1 capsule by mouth daily.     Yes [provider]  omeprazole (PRILOSEC) 20 MG capsule Take 20 mg by mouth daily.   Yes [provider]  tamsulosin (FLOMAX) 0.4 MG CAPS capsule Take 1 capsule (0.4 mg total) by mouth daily. Patient taking differently: Take 0.4 mg by mouth 2 (two) times daily.  08/19/17  Yes [name], MD  torsemide (DEMADEX) 20 MG  tablet Take 20 mg by mouth daily.   Yes [provider]  Vitamins C E (VITAMIN C & E COMBINATION) 500-400 MG-UNIT CAPS Take 1 capsule by mouth daily.    Yes [provider]    Review of Systems  Constitutional: Positive for fatigue. Negative for appetite change.  HENT: Positive for congestion. Negative for postnasal drip and sore throat.   Eyes: Negative.   Respiratory: Positive for shortness of breath. Negative for chest tightness.   Cardiovascular: Positive for leg swelling. Negative for chest pain and palpitations.  Gastrointestinal: Negative for abdominal distention and abdominal pain.  Endocrine: Negative.   Genitourinary: Negative for difficulty urinating and hematuria.  Musculoskeletal: Negative for back pain  and neck pain.  Skin: Negative.   Allergic/Immunologic: Negative.   Neurological: Positive for dizziness (intermittent). Negative for light-headedness.  Hematological: Negative for adenopathy. Does not bruise/bleed easily.  Psychiatric/Behavioral: Negative for dysphoric mood and sleep disturbance. The patient is not nervous/anxious.    Vitals:   04/16/18 1035  BP: 140/63  Pulse: 86  Resp: 18  SpO2: 100%  Weight: 182 lb 6 oz (82.7 kg)  Height: 5\' 6"  (1.676 m)   Wt Readings from Last 3 Encounters:  04/16/18 182 lb 6 oz (82.7 kg)  10/31/17 179 lb 8 oz (81.4 kg)  10/17/17 182 lb 8 oz (82.8 kg)   Lab Results  Component Value Date   CREATININE 1.76 (H) 09/06/2017   CREATININE 1.87 (H) 09/05/2017   CREATININE 2.01 (H) 08/18/2017    Physical Exam  Constitutional: He is oriented to person, place, and time. He appears well-developed and well-nourished.  HENT:  Head: Normocephalic and atraumatic.  Neck: Normal range of motion. Neck supple. No JVD present.  Cardiovascular: Normal rate and regular rhythm.  Pulmonary/Chest: Effort normal. No respiratory distress. He has no wheezes. He has no rales.  Abdominal: Soft. He exhibits no distension. There is no tenderness.  Musculoskeletal: He exhibits edema (1+ pitting edema bilateral lower legs). He exhibits no tenderness.  Neurological: He is alert and oriented to person, place, and time.  Skin: Skin is warm and dry.  Psychiatric: He has a normal mood and affect. His behavior is normal. Thought content normal.  Nursing note and vitals reviewed.  Assessment & Plan:  1: Chronic heart failure with preserved ejection fraction- - NYHA class II - mildly fluid overloaded today but stable - weighing daily and he was reminded to call for an overnight weight gain of >2 pounds or a weekly weight gain of >5 pounds - weight unchanged from last visit here 6 months ago - not adding salt and is using Mrs Deliah Boston seasoning instead. Reviewed the importance  of following a 2000mg  sodium diet - BNP from 09/05/17 was 274.0 - saw cardiology Clayborn Bigness) 09/26/17 & returns in one year - PharmD reconciled medications with the patient  2: HTN- - BP looks good today - BMP from 03/26/18 reviewed and showed sodium 139, potassium 4.5 and GFR 26 - saw PCP Ou[place] Sills) 11/30/17  3: Diabetes-  - A1c from 03/26/18 was 7.8% - nonfasting glucose in clinic today was 255; had toast and cranberry juice - follows with nephrology (K[place]uru) September  4: Lymphedema- - stage 2 - wears compression socks but not daily; encouraged him to wear them daily and remove them at bedtime - has been elevating his legs more often with a reduction in his edema - discussed lymphapress compression boots with patient but he's not currently interested at this time  Medication bottles were  reviewed.  Return in 6 months or sooner for any questions/problems before then.

## 2018-04-16 NOTE — Patient Instructions (Signed)
Continue weighing daily and call for an overnight weight gain of > 2 pounds or a weekly weight gain of >5 pounds. 

## 2018-05-01 ENCOUNTER — Encounter: Payer: Self-pay | Admitting: Urology

## 2018-05-01 ENCOUNTER — Ambulatory Visit (INDEPENDENT_AMBULATORY_CARE_PROVIDER_SITE_OTHER): Payer: Medicare Other | Admitting: Urology

## 2018-05-01 VITALS — BP 103/63 | HR 72 | Ht 66.0 in | Wt 176.8 lb

## 2018-05-01 DIAGNOSIS — N401 Enlarged prostate with lower urinary tract symptoms: Secondary | ICD-10-CM | POA: Diagnosis not present

## 2018-05-01 DIAGNOSIS — R3914 Feeling of incomplete bladder emptying: Secondary | ICD-10-CM | POA: Diagnosis not present

## 2018-05-01 DIAGNOSIS — R972 Elevated prostate specific antigen [PSA]: Secondary | ICD-10-CM

## 2018-05-01 LAB — BLADDER SCAN AMB NON-IMAGING

## 2018-05-01 MED ORDER — TAMSULOSIN HCL 0.4 MG PO CAPS: 0.4000 mg | ORAL_CAPSULE | Freq: Two times a day (BID) | ORAL | 3 refills | Status: DC

## 2018-05-01 NOTE — Progress Notes (Signed)
05/01/2018 10:35 AM   Bill Peterson 02-14-1935 [id number]  Referring provider: Kirk Ruths, MD [facility] [place], [place] [address]  Chief Complaint  Patient presents with  . Elevated PSA  . Benign Prostatic Hypertrophy   Urologic history: 1.  Elevated PSA -Prostate biopsy March 2007 PSA 4.2; benign pathology -Slowly rising PSA; declined further evaluation; last PSA 10/2017 9.2  2.  BPH with lower urinary tract symptoms -Episode urinary retention L/2019 associated with diuresis; on tamsulosin  HPI: 83 year old male presents for follow-up of the above problem list.  He remains on tamsulosin 0.8 mg daily.  He is voiding with a good stream.  Denies recurrent retention.  He has nocturia x1-3.  Denies gross hematuria.   PMH: Past Medical History:  Diagnosis Date  . BPH (benign prostatic hyperplasia)   . CHF (congestive heart failure) ([place])   . Diabetes mellitus without complication ([place])   . Hyperlipidemia   . Hypertension     Surgical History: Past Surgical History:  Procedure Laterality Date  . BACK SURGERY    . CARDIAC SURGERY    . STOMACH SURGERY      Home Medications:  Allergies as of 05/01/2018      Reactions   Heparin    REACTION: Unspecified   Repaglinide Other (See Comments)   Increased appetite Increased appetite Increased appetite      Medication List        Accurate as of 05/01/18 10:35 AM. Always use your most recent med list.          amLODipine 5 MG tablet Commonly known as:  NORVASC Take 5 mg by mouth daily.   aspirin 325 MG tablet Take 325 mg by mouth daily.   atorvastatin 20 MG tablet Commonly known as:  LIPITOR Take 20 mg by mouth daily.   carvedilol 12.5 MG tablet Commonly known as:  COREG Take 12.5 mg by mouth 2 (two) times daily with a meal.   CINNAMON PO Take 1,000 tablets by mouth daily at 6 (six) AM.   Garlic 6440 MG Caps Take 1,000 mg by mouth daily at 6 (six) AM.   Ginger  Root 550 MG Caps Take 1 capsule by mouth daily at 6 (six) AM.   glimepiride 1 MG tablet Commonly known as:  AMARYL Take 1 mg by mouth daily with breakfast.   lisinopril 20 MG tablet Commonly known as:  PRINIVIL,ZESTRIL Take 20 mg by mouth daily.   magnesium oxide 400 MG tablet Commonly known as:  MAG-OX Take 500 mg by mouth daily.   multivitamin capsule Take 1 capsule by mouth daily.   omeprazole 20 MG capsule Commonly known as:  PRILOSEC Take 20 mg by mouth daily.   tamsulosin 0.4 MG Caps capsule Commonly known as:  FLOMAX Take 1 capsule (0.4 mg total) by mouth 2 (two) times daily.   torsemide 20 MG tablet Commonly known as:  DEMADEX Take 20 mg by mouth daily.   VITAMIN C & E COMBINATION 500-400 MG-UNIT Caps Take 1 capsule by mouth daily.       Allergies:  Allergies  Allergen Reactions  . Heparin     REACTION: Unspecified  . Repaglinide Other (See Comments)    Increased appetite Increased appetite Increased appetite     Family History: Family History  Problem Relation Age of Onset  . Heart disease Mother   . Diabetes Father   . Prostate cancer Neg Hx   . Kidney cancer  Neg Hx   . Bladder Cancer Neg Hx     Social History:  reports that he has never smoked. He has never used smokeless tobacco. He reports that he does not drink alcohol or use drugs.  ROS: UROLOGY Frequent Urination?: Yes Hard to postpone urination?: No Burning/pain with urination?: No Get up at night to urinate?: Yes Leakage of urine?: No Urine stream starts and stops?: No Trouble starting stream?: No Do you have to strain to urinate?: No Blood in urine?: No Urinary tract infection?: No Sexually transmitted disease?: No Injury to kidneys or bladder?: No Painful intercourse?: No Weak stream?: No Erection problems?: No Penile pain?: No  Gastrointestinal Nausea?: No Vomiting?: No Indigestion/heartburn?: No Diarrhea?: No Constipation?: No  Constitutional Fever: No Night  sweats?: No Weight loss?: No Fatigue?: No  Skin Skin rash/lesions?: No Itching?: No  Eyes Blurred vision?: No Double vision?: No  Ears/Nose/Throat Sore throat?: No Sinus problems?: No  Hematologic/Lymphatic Swollen glands?: No Easy bruising?: No  Cardiovascular Leg swelling?: Yes Chest pain?: No  Respiratory Cough?: No Shortness of breath?: No  Endocrine Excessive thirst?: No  Musculoskeletal Back pain?: No Joint pain?: No  Neurological Headaches?: No Dizziness?: No  Psychologic Depression?: No Anxiety?: No  Physical Exam: BP 103/63 (BP Location: Left Arm, Patient Position: Sitting, Cuff Size: Large)   Pulse 72   Ht 5\' 6"  (1.676 m)   Wt 176 lb 12.8 oz (80.2 kg)   BMI 28.54 kg/m   Constitutional:  Alert and oriented, No acute distress. HEENT: [place] AT, moist mucus membranes.  Trachea midline, no masses. Cardiovascular: No clubbing, cyanosis, or edema. Respiratory: Normal respiratory effort, no increased work of breathing. GI: Abdomen is soft, nontender, nondistended, no abdominal masses GU: No CVA tenderness.  Prostate 50 g, smooth without nodules Lymph: No cervical or inguinal lymphadenopathy. Skin: No rashes, bruises or suspicious lesions. Neurologic: Grossly intact, no focal deficits, moving all 4 extremities. Psychiatric: Normal mood and affect.   Assessment & Plan:    1. Benign prostatic hyperplasia with incomplete bladder emptying PVR by bladder scan today much better at 73 mL.  Tamsulosin was refilled.  2. Elevated PSA Benign DRE.  Potential causes of an elevated PSA were discussed including BPH, inflammation and prostate cancer.  Options were discussed including repeat biopsy, prostate MRI and surveillance.  I also discussed 4Kscore.  He would like to have a 4Kscore performed and if there is a higher probability of high risk prostate cancer he we will proceed with an MRI.   Abbie Sons, [place] [address] [place], [place] [address] [phone]

## 2018-05-02 ENCOUNTER — Ambulatory Visit (INDEPENDENT_AMBULATORY_CARE_PROVIDER_SITE_OTHER): Payer: Medicare Other

## 2018-05-02 DIAGNOSIS — R972 Elevated prostate specific antigen [PSA]: Secondary | ICD-10-CM | POA: Diagnosis not present

## 2018-05-02 NOTE — Progress Notes (Signed)
Pt presents today for 4K score blood draw. Pt identified by name and DOB, blood draw completed with no complications. Pt informed that we would call him with results.

## 2018-05-07 ENCOUNTER — Other Ambulatory Visit: Payer: Self-pay | Admitting: Urology

## 2018-05-11 NOTE — Progress Notes (Signed)
4Kscore was abnormal with an 88% probability of high grade prostate cancer.  Recc proceeding with prostate MRI.

## 2018-05-13 NOTE — Progress Notes (Signed)
Called patient no answer, busy could not leave vmail

## 2018-08-15 ENCOUNTER — Ambulatory Visit (INDEPENDENT_AMBULATORY_CARE_PROVIDER_SITE_OTHER): Payer: Medicare Other | Admitting: Urology

## 2018-08-15 ENCOUNTER — Encounter: Payer: Self-pay | Admitting: Urology

## 2018-08-15 VITALS — BP 136/65 | HR 78 | Ht 66.0 in | Wt 183.0 lb

## 2018-08-15 DIAGNOSIS — N401 Enlarged prostate with lower urinary tract symptoms: Secondary | ICD-10-CM

## 2018-08-15 DIAGNOSIS — R972 Elevated prostate specific antigen [PSA]: Secondary | ICD-10-CM

## 2018-08-15 DIAGNOSIS — R3914 Feeling of incomplete bladder emptying: Secondary | ICD-10-CM

## 2018-08-15 DIAGNOSIS — Z87898 Personal history of other specified conditions: Secondary | ICD-10-CM

## 2018-08-15 LAB — BLADDER SCAN AMB NON-IMAGING

## 2018-08-15 NOTE — Patient Instructions (Signed)

## 2018-08-15 NOTE — Progress Notes (Signed)
08/15/2018 11:39 AM   Bill Peterson 1934/10/19 [id number]  Referring provider: Kirk Ruths, MD [place] [facility] [place], [place] [address]  Chief Complaint  Patient presents with  . Follow-up    HPI: 83 year old male presents for follow-up of an elevated PSA.  I last saw him on 05/01/2018 and he elected to have a 4K score drawn.  His PSA was 7.64 and 4K indicated an 88% probability of Gleason 7 or higher prostate cancer.  He was contacted however did not answer and his voicemail box was full.  He presents today for follow-up and has no complaints.     PMH: Past Medical History:  Diagnosis Date  . BPH (benign prostatic hyperplasia)   . CHF (congestive heart failure) ([place])   . Diabetes mellitus without complication ([place])   . Hyperlipidemia   . Hypertension     Surgical History: Past Surgical History:  Procedure Laterality Date  . BACK SURGERY    . CARDIAC SURGERY    . STOMACH SURGERY      Home Medications:  Allergies as of 08/15/2018      Reactions   Heparin    REACTION: Unspecified   Repaglinide Other (See Comments)   Increased appetite Increased appetite Increased appetite      Medication List       Accurate as of August 15, 2018 11:39 AM. Always use your most recent med list.        amLODipine 5 MG tablet Commonly known as:  NORVASC Take 5 mg by mouth daily.   aspirin 325 MG tablet Take 325 mg by mouth daily.   atorvastatin 20 MG tablet Commonly known as:  LIPITOR Take 20 mg by mouth daily.   carvedilol 12.5 MG tablet Commonly known as:  COREG Take 12.5 mg by mouth 2 (two) times daily with a meal.   CINNAMON PO Take 1,000 tablets by mouth daily at 6 (six) AM.   Garlic 2426 MG Caps Take 1,000 mg by mouth daily at 6 (six) AM.   Ginger Root 550 MG Caps Take 1 capsule by mouth daily at 6 (six) AM.   glimepiride 1 MG tablet Commonly known as:  AMARYL Take 1 mg by mouth daily with breakfast.     lisinopril 20 MG tablet Commonly known as:  PRINIVIL,ZESTRIL Take 20 mg by mouth daily.   magnesium oxide 400 MG tablet Commonly known as:  MAG-OX Take 500 mg by mouth daily.   multivitamin capsule Take 1 capsule by mouth daily.   omeprazole 20 MG capsule Commonly known as:  PRILOSEC Take 20 mg by mouth daily.   tamsulosin 0.4 MG Caps capsule Commonly known as:  FLOMAX Take 1 capsule (0.4 mg total) by mouth 2 (two) times daily.   torsemide 20 MG tablet Commonly known as:  DEMADEX Take 20 mg by mouth daily.   VITAMIN C & E COMBINATION 500-400 MG-UNIT Caps Take 1 capsule by mouth daily.       Allergies:  Allergies  Allergen Reactions  . Heparin     REACTION: Unspecified  . Repaglinide Other (See Comments)    Increased appetite Increased appetite Increased appetite     Family History: Family History  Problem Relation Age of Onset  . Heart disease Mother   . Diabetes Father   . Prostate cancer Neg Hx   . Kidney cancer Neg Hx   . Bladder Cancer Neg Hx     Social History:  reports that  he has never smoked. He has never used smokeless tobacco. He reports that he does not drink alcohol or use drugs.  ROS: No significant change from 05/01/2018  Physical Exam: BP 136/65 (BP Location: Left Arm, Patient Position: Sitting, Cuff Size: Normal)   Pulse 78   Ht 5\' 6"  (1.676 m)   Wt 183 lb (83 kg)   BMI 29.54 kg/m   Constitutional:  Alert and oriented, No acute distress. HEENT: [place] AT, moist mucus membranes.  Trachea midline, no masses. Cardiovascular: No clubbing, cyanosis, or edema. Respiratory: Normal respiratory effort, no increased work of breathing. GI: Abdomen is soft, nontender, nondistended, no abdominal masses GU: No CVA tenderness Lymph: No cervical or inguinal lymphadenopathy. Skin: No rashes, bruises or suspicious lesions. Neurologic: Grossly intact, no focal deficits, moving all 4 extremities. Psychiatric: Normal mood and affect.   Assessment &  Plan:    1. Elevated PSA His 4K score results were discussed in detail.  I recommended scheduling a prostate biopsy for evaluation of intermediate to high risk prostate cancer and he has elected to proceed.  The procedure was discussed including potential risks of bleeding and infection/sepsis.  2. Benign prostatic hyperplasia with incomplete bladder emptying Stable lower urinary tract symptoms.  PVR was stable at 184 mL   Abbie Sons, MD  [facility] [address] [place], [place] [address] [phone]

## 2018-08-28 HISTORY — PX: BIOPSY PROSTATE: PRO28

## 2018-09-13 ENCOUNTER — Other Ambulatory Visit: Payer: Self-pay | Admitting: Urology

## 2018-09-13 ENCOUNTER — Encounter: Payer: Self-pay | Admitting: Urology

## 2018-09-13 ENCOUNTER — Ambulatory Visit (INDEPENDENT_AMBULATORY_CARE_PROVIDER_SITE_OTHER): Payer: Medicare Other | Admitting: Urology

## 2018-09-13 VITALS — BP 125/68 | HR 96 | Ht 66.0 in | Wt 186.2 lb

## 2018-09-13 DIAGNOSIS — R972 Elevated prostate specific antigen [PSA]: Secondary | ICD-10-CM | POA: Diagnosis not present

## 2018-09-13 MED ORDER — GENTAMICIN SULFATE 40 MG/ML IJ SOLN
80.0000 mg | Freq: Once | INTRAMUSCULAR | Status: AC
  Administered 2018-09-13: 80 mg via INTRAMUSCULAR

## 2018-09-13 MED ORDER — LEVOFLOXACIN 500 MG PO TABS
500.0000 mg | ORAL_TABLET | Freq: Once | ORAL | Status: AC
  Administered 2018-09-13: 500 mg via ORAL

## 2018-09-13 NOTE — Progress Notes (Signed)
Prostate Biopsy Procedure   Informed consent was obtained after discussing risks/benefits of the procedure.  A time out was performed to ensure correct patient identity.  Indications: PSA 7.64 with elevated risk 4K score at 88%  Pre-Procedure: - Last PSA Level: 7.64 04/2018 - Gentamicin given prophylactically - Levaquin 500 mg administered PO -Transrectal Ultrasound performed revealing a 145 gm prostate -No significant hypoechoic or median lobe noted  Procedure: - Prostate block performed using 10 cc 1% lidocaine and biopsies taken from sextant areas, a total of 12 under ultrasound guidance.  Post-Procedure: - Patient tolerated the procedure well - He was counseled to seek immediate medical attention if experiences any severe pain, significant bleeding, or fevers - Return in one week to discuss biopsy results   John Giovanni, MD

## 2018-09-19 LAB — PATHOLOGY REPORT

## 2018-09-20 ENCOUNTER — Other Ambulatory Visit: Payer: Self-pay | Admitting: Urology

## 2018-09-27 ENCOUNTER — Ambulatory Visit (INDEPENDENT_AMBULATORY_CARE_PROVIDER_SITE_OTHER): Payer: Medicare Other | Admitting: Urology

## 2018-09-27 ENCOUNTER — Encounter: Payer: Self-pay | Admitting: Urology

## 2018-09-27 VITALS — BP 130/73 | HR 86 | Ht 66.0 in | Wt 185.6 lb

## 2018-09-27 DIAGNOSIS — N401 Enlarged prostate with lower urinary tract symptoms: Secondary | ICD-10-CM

## 2018-09-27 DIAGNOSIS — R972 Elevated prostate specific antigen [PSA]: Secondary | ICD-10-CM

## 2018-09-29 ENCOUNTER — Encounter: Payer: Self-pay | Admitting: Urology

## 2018-09-29 DIAGNOSIS — N401 Enlarged prostate with lower urinary tract symptoms: Secondary | ICD-10-CM

## 2018-09-29 DIAGNOSIS — N400 Enlarged prostate without lower urinary tract symptoms: Secondary | ICD-10-CM | POA: Insufficient documentation

## 2018-09-29 MED ORDER — FINASTERIDE 5 MG PO TABS: 5.0000 mg | ORAL_TABLET | Freq: Every day | ORAL | 3 refills | Status: DC

## 2018-09-29 NOTE — Progress Notes (Signed)
09/27/2018 8:50 PM   Bill Peterson [birth year] [id number]  Referring provider: Kirk Ruths, MD [place] [facility] [place], [place] [address]  Chief Complaint  Patient presents with  . Follow-up    HPI: 84 year old male presents for prostate biopsy follow-up.  Prostate biopsy was performed on 09/13/2018 for a PSA of 7.4 and elevated risk 4K score at 88%.  He had no post biopsy complaints.  He had significant prostate enlargement with volume measured at 145 g.  Standard 12 core biopsies were performed.  Pathology: Biopsy from the left base showed a small focus of atypical glands suspicious for low-grade prostate cancer.  Remaining biopsies showed benign prostate tissue with several showing acute and chronic inflammation.  PMH: Past Medical History:  Diagnosis Date  . BPH (benign prostatic hyperplasia)   . CHF (congestive heart failure) ([place])   . Diabetes mellitus without complication ([place])   . Hyperlipidemia   . Hypertension     Surgical History: Past Surgical History:  Procedure Laterality Date  . BACK SURGERY    . CARDIAC SURGERY    . STOMACH SURGERY      Home Medications:  Allergies as of 09/27/2018      Reactions   Heparin    REACTION: Unspecified   Repaglinide Other (See Comments)   Increased appetite Increased appetite Increased appetite      Medication List       Accurate as of September 27, 2018 11:59 PM. Always use your most recent med list.        amLODipine 5 MG tablet Commonly known as:  NORVASC Take 5 mg by mouth daily.   aspirin 325 MG tablet Take 325 mg by mouth daily.   atorvastatin 20 MG tablet Commonly known as:  LIPITOR Take 20 mg by mouth daily.   carvedilol 12.5 MG tablet Commonly known as:  COREG Take 12.5 mg by mouth 2 (two) times daily with a meal.   CINNAMON PO Take 1,000 tablets by mouth daily at 6 (six) AM.   Garlic 3532 MG Caps Take 1,000 mg by mouth daily at 6 (six) AM.   Ginger Root  550 MG Caps Take 1 capsule by mouth daily at 6 (six) AM.   glimepiride 1 MG tablet Commonly known as:  AMARYL Take 1 mg by mouth daily with breakfast.   lisinopril 20 MG tablet Commonly known as:  PRINIVIL,ZESTRIL Take 20 mg by mouth daily.   magnesium oxide 400 MG tablet Commonly known as:  MAG-OX Take 500 mg by mouth daily.   multivitamin capsule Take 1 capsule by mouth daily.   omeprazole 20 MG capsule Commonly known as:  PRILOSEC Take 20 mg by mouth daily.   tamsulosin 0.4 MG Caps capsule Commonly known as:  FLOMAX Take 1 capsule (0.4 mg total) by mouth 2 (two) times daily.   torsemide 20 MG tablet Commonly known as:  DEMADEX Take 20 mg by mouth daily.   VITAMIN C & E COMBINATION 500-400 MG-UNIT Caps Take 1 capsule by mouth daily.       Allergies:  Allergies  Allergen Reactions  . Heparin     REACTION: Unspecified  . Repaglinide Other (See Comments)    Increased appetite Increased appetite Increased appetite     Family History: Family History  Problem Relation Age of Onset  . Heart disease Mother   . Diabetes Father   . Prostate cancer Neg Hx   . Kidney cancer Neg Hx   .  Bladder Cancer Neg Hx     Social History:  reports that he has never smoked. He has never used smokeless tobacco. He reports that he does not drink alcohol or use drugs.  ROS: UROLOGY Frequent Urination?: No Hard to postpone urination?: No Burning/pain with urination?: No Get up at night to urinate?: No Leakage of urine?: No Urine stream starts and stops?: No Trouble starting stream?: No Do you have to strain to urinate?: No Blood in urine?: No Urinary tract infection?: No Sexually transmitted disease?: No Injury to kidneys or bladder?: No Painful intercourse?: No Weak stream?: No Erection problems?: No Penile pain?: No  Gastrointestinal Nausea?: No Vomiting?: No Indigestion/heartburn?: No Diarrhea?: No Constipation?: No  Constitutional Fever: No Night  sweats?: No Weight loss?: No Fatigue?: No  Skin Skin rash/lesions?: No Itching?: No  Eyes Blurred vision?: No Double vision?: No  Ears/Nose/Throat Sore throat?: No Sinus problems?: No  Hematologic/Lymphatic Swollen glands?: No Easy bruising?: No  Cardiovascular Leg swelling?: No Chest pain?: No  Respiratory Cough?: No Shortness of breath?: No  Endocrine Excessive thirst?: No  Musculoskeletal Back pain?: No Joint pain?: No  Neurological Headaches?: No Dizziness?: No  Psychologic Depression?: No Anxiety?: No  Physical Exam: BP 130/73 (BP Location: Left Arm, Patient Position: Sitting, Cuff Size: Normal)   Pulse 86   Ht 5\' 6"  (1.676 m)   Wt 185 lb 9.6 oz (84.2 kg)   BMI 29.96 kg/m   Constitutional:  Alert and oriented, No acute distress. HEENT: [place] AT, moist mucus membranes.  Trachea midline, no masses. Cardiovascular: No clubbing, cyanosis, or edema Skin: No rashes, bruises or suspicious lesions. Neurologic: Grossly intact, no focal deficits, moving all 4 extremities. Psychiatric: Normal mood and affect.   Assessment & Plan:   84 year old male with an elevated PSA and elevated risk 4K however prostate biopsy only showed a small focus of atypical glands suspicious for low-grade prostate cancer.  I discussed the pathology report in details with Bill Peterson.  Based on significant prostate enlargement we will start finasteride 5 mg daily.  Follow-up PSA 6 months.  Would not re-biopsy or evaluate further unless his PSA continues to rise.  Return in about 6 months (around 03/28/2019) for PSA.   Abbie Sons, [place] [address] [place], [place] [address] [phone]

## 2018-09-30 ENCOUNTER — Telehealth: Payer: Self-pay | Admitting: Urology

## 2018-09-30 ENCOUNTER — Other Ambulatory Visit: Payer: Self-pay

## 2018-09-30 MED ORDER — FINASTERIDE 5 MG PO TABS: 5.0000 mg | ORAL_TABLET | Freq: Every day | ORAL | 3 refills | Status: DC

## 2018-10-03 NOTE — Telephone Encounter (Signed)
error 

## 2018-10-15 ENCOUNTER — Other Ambulatory Visit: Payer: Self-pay

## 2018-10-15 ENCOUNTER — Encounter: Payer: Self-pay | Admitting: Family

## 2018-10-15 ENCOUNTER — Ambulatory Visit: Payer: Medicare Other | Attending: Family | Admitting: Family

## 2018-10-15 VITALS — BP 125/67 | HR 83 | Temp 98.4°F | Resp 20 | Ht 66.0 in | Wt 186.8 lb

## 2018-10-15 DIAGNOSIS — I5032 Chronic diastolic (congestive) heart failure: Secondary | ICD-10-CM

## 2018-10-15 DIAGNOSIS — E119 Type 2 diabetes mellitus without complications: Secondary | ICD-10-CM | POA: Diagnosis not present

## 2018-10-15 DIAGNOSIS — Z888 Allergy status to other drugs, medicaments and biological substances status: Secondary | ICD-10-CM | POA: Insufficient documentation

## 2018-10-15 DIAGNOSIS — I10 Essential (primary) hypertension: Secondary | ICD-10-CM

## 2018-10-15 DIAGNOSIS — Z7982 Long term (current) use of aspirin: Secondary | ICD-10-CM | POA: Diagnosis not present

## 2018-10-15 DIAGNOSIS — E785 Hyperlipidemia, unspecified: Secondary | ICD-10-CM | POA: Diagnosis not present

## 2018-10-15 DIAGNOSIS — I890 Lymphedema, not elsewhere classified: Secondary | ICD-10-CM | POA: Diagnosis not present

## 2018-10-15 DIAGNOSIS — N183 Chronic kidney disease, stage 3 unspecified: Secondary | ICD-10-CM

## 2018-10-15 DIAGNOSIS — N400 Enlarged prostate without lower urinary tract symptoms: Secondary | ICD-10-CM | POA: Insufficient documentation

## 2018-10-15 DIAGNOSIS — I110 Hypertensive heart disease with heart failure: Secondary | ICD-10-CM | POA: Diagnosis not present

## 2018-10-15 DIAGNOSIS — Z79899 Other long term (current) drug therapy: Secondary | ICD-10-CM | POA: Diagnosis not present

## 2018-10-15 DIAGNOSIS — Z7984 Long term (current) use of oral hypoglycemic drugs: Secondary | ICD-10-CM | POA: Insufficient documentation

## 2018-10-15 DIAGNOSIS — Z8249 Family history of ischemic heart disease and other diseases of the circulatory system: Secondary | ICD-10-CM | POA: Insufficient documentation

## 2018-10-15 DIAGNOSIS — E1122 Type 2 diabetes mellitus with diabetic chronic kidney disease: Secondary | ICD-10-CM

## 2018-10-15 DIAGNOSIS — R5383 Other fatigue: Secondary | ICD-10-CM | POA: Diagnosis present

## 2018-10-15 NOTE — Progress Notes (Signed)
Patient ID: Bill Peterson, male    DOB: [birth year], 84 y.o.   MRN: [id number]  HPI  Bill Peterson is an 83 y/o male with a history of diabetes, hyperlipidemia, HTN, BPH and chronic heart failure.   Echo report from 08/16/17 reviewed and showed an EF of 65-70% along with mild AR and moderate Bill.   Has not been admitted or been in the ED in the last 6 months.   He presents today for a follow-up visit with a chief complaint of minimal fatigue upon moderate exertion. He describes this as chronic in nature having been present for several years. He has associated shortness of breath, pedal edema, dizziness, difficulty sleeping and slight weight gain along with this. He denies any abdominal distention, palpitations or chest pain. Has been having intermittent headaches and was wondering if it was any of his medications. Last had his eyes checked June/July 2019. Does have some difficulty sleeping but says that he's been napping more during the day and later in the day.   Past Medical History:  Diagnosis Date  . BPH (benign prostatic hyperplasia)   . CHF (congestive heart failure) ([place])   . Diabetes mellitus without complication ([place])   . Hyperlipidemia   . Hypertension    Past Surgical History:  Procedure Laterality Date  . BACK SURGERY    . CARDIAC SURGERY    . STOMACH SURGERY     Family History  Problem Relation Age of Onset  . Heart disease Mother   . Diabetes Father   . Prostate cancer Neg Hx   . Kidney cancer Neg Hx   . Bladder Cancer Neg Hx    Social History   Tobacco Use  . Smoking status: Never Smoker  . Smokeless tobacco: Never Used  Substance Use Topics  . Alcohol use: No   Allergies  Allergen Reactions  . Heparin     REACTION: Unspecified  . Repaglinide Other (See Comments)    Increased appetite Increased appetite Increased appetite    Prior to Admission medications   Medication Sig Start Date End Date Taking? Authorizing Provider  amLODipine (NORVASC) 5 MG  tablet Take 5 mg by mouth daily.    Yes [provider]  aspirin 325 MG tablet Take 325 mg by mouth daily.     Yes [provider]  atorvastatin (LIPITOR) 20 MG tablet Take 20 mg by mouth daily.   Yes [provider]  carvedilol (COREG) 12.5 MG tablet Take 12.5 mg by mouth 2 (two) times daily with a meal.   Yes [provider]  Cholecalciferol (VITAMIN D3) 10 MCG (400 UNIT) tablet Take 400 Units by mouth daily.   Yes [provider]  CINNAMON PO Take 1,000 tablets by mouth daily at 6 (six) AM.   Yes [provider]  finasteride (PROSCAR) 5 MG tablet Take 1 tablet (5 mg total) by mouth daily. 09/30/18  Yes Stoioff, Ronda Fairly, MD  Garlic 8882 MG CAPS Take 1,000 mg by mouth daily at 6 (six) AM.   Yes [provider]  Ginger, Zingiber officinalis, (GINGER ROOT) 550 MG CAPS Take 1 capsule by mouth daily at 6 (six) AM.   Yes [provider]  glimepiride (AMARYL) 1 MG tablet Take 1 mg by mouth daily with breakfast.   Yes [provider]  lisinopril (PRINIVIL,ZESTRIL) 20 MG tablet Take 20 mg by mouth daily.   Yes [provider]  magnesium oxide (MAG-OX) 400 MG tablet Take 500 mg by  mouth daily.   Yes [provider]  Multiple Vitamin (MULTIVITAMIN) capsule Take 1 capsule by mouth daily.     Yes [provider]  omeprazole (PRILOSEC) 20 MG capsule Take 20 mg by mouth daily.   Yes [provider]  Semaglutide 14 MG TABS Take by mouth daily. Or placebo   Yes [provider]  tamsulosin (FLOMAX) 0.4 MG CAPS capsule Take 1 capsule (0.4 mg total) by mouth 2 (two) times daily. 05/01/18  Yes Stoioff, Ronda Fairly, MD  torsemide (DEMADEX) 20 MG tablet Take 20 mg by mouth daily.   Yes [provider]  Vitamins C E (VITAMIN C & E COMBINATION) 500-400 MG-UNIT CAPS Take 1 capsule by mouth daily.    Yes [provider]    Review of Systems  Constitutional: Positive for fatigue.  Negative for appetite change.  HENT: Positive for congestion. Negative for postnasal drip and sore throat.   Eyes: Negative.   Respiratory: Positive for shortness of breath. Negative for chest tightness.   Cardiovascular: Positive for leg swelling. Negative for chest pain and palpitations.  Gastrointestinal: Negative for abdominal distention and abdominal pain.  Endocrine: Negative.   Genitourinary: Negative for difficulty urinating and hematuria.  Musculoskeletal: Negative for back pain and neck pain.  Skin: Negative.   Allergic/Immunologic: Negative.   Neurological: Positive for dizziness (intermittent). Negative for light-headedness.  Hematological: Negative for adenopathy. Does not bruise/bleed easily.  Psychiatric/Behavioral: Negative for dysphoric mood and sleep disturbance. The patient is not nervous/anxious.    Vitals:   10/15/18 1100  BP: 125/67  Pulse: 83  Resp: 20  Temp: 98.4 F (36.9 C)  TempSrc: Oral  SpO2: 100%  Weight: 186 lb 12.8 oz (84.7 kg)  Height: 5\' 6"  (1.676 m)   Wt Readings from Last 3 Encounters:  10/15/18 186 lb 12.8 oz (84.7 kg)  09/27/18 185 lb 9.6 oz (84.2 kg)  09/13/18 186 lb 3.2 oz (84.5 kg)   Lab Results  Component Value Date   CREATININE 1.76 (H) 09/06/2017   CREATININE 1.87 (H) 09/05/2017   CREATININE 2.01 (H) 08/18/2017    Physical Exam  Constitutional: He is oriented to person, place, and time. He appears well-developed and well-nourished.  HENT:  Head: Normocephalic and atraumatic.  Neck: Normal range of motion. Neck supple. No JVD present.  Cardiovascular: Normal rate and regular rhythm.  Pulmonary/Chest: Effort normal. No respiratory distress. He has no wheezes. He has no rales.  Abdominal: Soft. He exhibits no distension. There is no abdominal tenderness.  Musculoskeletal:        General: Edema (1+ pitting edema bilateral lower legs) present. No tenderness.  Neurological: He is alert and oriented to person, place, and time.   Skin: Skin is warm and dry.  Psychiatric: He has a normal mood and affect. His behavior is normal. Thought content normal.  Nursing note and vitals reviewed.  Assessment & Plan:  1: Chronic heart failure with preserved ejection fraction- - NYHA class II - euvolemic - weighing daily and he was reminded to call for an overnight weight gain of >2 pounds or a weekly weight gain of >5 pounds - weight up 4 pounds from last visit 6 months ago - not adding salt and is using Mrs Deliah Boston seasoning instead. Reviewed the importance of following a 2000mg  sodium diet - BNP from 09/05/17 was 274.0 - saw cardiology Clayborn Bigness) 09/24/2018 - patient says that he's received his flu vaccine for this season  2: HTN- - BP looks good today -  BMP from 08/01/18 reviewed and showed sodium 140, potassium 4.4, creatinine 2.0 and GFR 32 - saw PCP Ouida Sills) 08/08/18  3: Diabetes-  - A1c from 08/01/18 was 8.2% - glucose at home has been "pretty good" - saw nephrology Juleen China) January 2020  4: Lymphedema- - stage 2 - has been wearing compression socks daily but edema continues - has been elevating his legs more often with a reduction in his edema - again discussed lymphapress compression boots with patient but he's not currently interested at this time  Medication bottles were reviewed.  Return in 6 months or sooner for any questions/problems before then.

## 2018-10-15 NOTE — Patient Instructions (Signed)
Continue weighing daily and call for an overnight weight gain of > 2 pounds or a weekly weight gain of >5 pounds. 

## 2019-03-25 DIAGNOSIS — E08319 Diabetes mellitus due to underlying condition with unspecified diabetic retinopathy without macular edema: Secondary | ICD-10-CM | POA: Insufficient documentation

## 2019-03-27 ENCOUNTER — Other Ambulatory Visit: Payer: Self-pay

## 2019-03-27 DIAGNOSIS — R972 Elevated prostate specific antigen [PSA]: Secondary | ICD-10-CM

## 2019-03-28 ENCOUNTER — Other Ambulatory Visit: Payer: Medicare Other

## 2019-03-28 ENCOUNTER — Other Ambulatory Visit: Payer: Self-pay

## 2019-03-28 DIAGNOSIS — R972 Elevated prostate specific antigen [PSA]: Secondary | ICD-10-CM

## 2019-03-29 LAB — PSA: Prostate Specific Ag, Serum: 7.9 ng/mL — ABNORMAL HIGH (ref 0.0–4.0)

## 2019-04-02 ENCOUNTER — Ambulatory Visit: Payer: Medicare Other | Admitting: Urology

## 2019-04-11 ENCOUNTER — Ambulatory Visit (INDEPENDENT_AMBULATORY_CARE_PROVIDER_SITE_OTHER): Payer: Medicare Other | Admitting: Urology

## 2019-04-11 ENCOUNTER — Encounter: Payer: Self-pay | Admitting: Urology

## 2019-04-11 ENCOUNTER — Other Ambulatory Visit: Payer: Self-pay

## 2019-04-11 VITALS — BP 117/67 | HR 82 | Ht 66.0 in | Wt 185.0 lb

## 2019-04-11 DIAGNOSIS — N4232 Atypical small acinar proliferation of prostate: Secondary | ICD-10-CM

## 2019-04-11 DIAGNOSIS — N400 Enlarged prostate without lower urinary tract symptoms: Secondary | ICD-10-CM | POA: Diagnosis not present

## 2019-04-11 DIAGNOSIS — R972 Elevated prostate specific antigen [PSA]: Secondary | ICD-10-CM | POA: Diagnosis not present

## 2019-04-14 ENCOUNTER — Encounter: Payer: Self-pay | Admitting: Urology

## 2019-04-14 DIAGNOSIS — N4232 Atypical small acinar proliferation of prostate: Secondary | ICD-10-CM | POA: Insufficient documentation

## 2019-04-14 NOTE — Progress Notes (Signed)
Patient ID: Bill Peterson, male    DOB: October 11, 1934, 84 y.o.   MRN: [id number]  HPI  Bill Peterson is an 84 y/o male with a history of diabetes, hyperlipidemia, HTN, BPH and chronic heart failure.   Echo report from 08/16/17 reviewed and showed an EF of 65-70% along with mild AR and moderate Bill.   Had stress test done 09/04/17 which showed: LVEF= 58 % FINDINGS: Regional wall motion:reveals normal myocardial thickening and wall  motion. The overall quality of the study is good. Artifacts noted: no Left ventricular cavity: Mild left ventricular enlargement Perfusion Analysis:SPECT images demonstrate homogeneous tracer  distribution throughout the myocardium.  Has not been admitted or been in the ED in the last 6 months.   Bill Peterson presents today for a follow-up visit with a chief complaint of minimal fatigue upon moderate exertion. Bill Peterson describes this as chronic in nature having been present for several years. Bill Peterson has associated pedal edema and occasional dizziness along with this. Bill Peterson denies any difficulty sleeping, abdominal distention, palpitations, chest pain, shortness of breath, cough or weight gain. Says that the swelling in his legs goes down overnight.   Past Medical History:  Diagnosis Date  . BPH (benign prostatic hyperplasia)   . CHF (congestive heart failure) ([place])   . Diabetes mellitus without complication ([place])   . Hyperlipidemia   . Hypertension    Past Surgical History:  Procedure Laterality Date  . BACK SURGERY    . BIOPSY PROSTATE  08/2018  . CARDIAC SURGERY    . STOMACH SURGERY     Family History  Problem Relation Age of Onset  . Heart disease Mother   . Diabetes Father   . Prostate cancer Neg Hx   . Kidney cancer Neg Hx   . Bladder Cancer Neg Hx    Social History   Tobacco Use  . Smoking status: Never Smoker  . Smokeless tobacco: Never Used  Substance Use Topics  . Alcohol use: No   Allergies  Allergen Reactions  . Heparin     REACTION: Unspecified   . Repaglinide Other (See Comments)    Increased appetite Increased appetite Increased appetite    Prior to Admission medications   Medication Sig Start Date End Date Taking? Authorizing Provider  amLODipine (NORVASC) 5 MG tablet Take 5 mg by mouth daily.    Yes [provider]  aspirin 325 MG tablet Take 325 mg by mouth daily.     Yes [provider]  atorvastatin (LIPITOR) 20 MG tablet Take 20 mg by mouth daily.   Yes [provider]  carvedilol (COREG) 12.5 MG tablet Take 12.5 mg by mouth 2 (two) times daily with a meal.   Yes [provider]  Cholecalciferol (VITAMIN D3) 10 MCG (400 UNIT) tablet Take 400 Units by mouth daily.   Yes [provider]  CINNAMON PO Take 1,000 tablets by mouth daily at 6 (six) AM.   Yes [provider]  finasteride (PROSCAR) 5 MG tablet Take 1 tablet (5 mg total) by mouth daily. 09/30/18  Yes Stoioff, Ronda Fairly, MD  Garlic 9509 MG CAPS Take 1,000 mg by mouth daily at 6 (six) AM.   Yes [provider]  Ginger, Zingiber officinalis, (GINGER ROOT) 550 MG CAPS Take 1 capsule by mouth daily at 6 (six) AM.   Yes [provider]  glimepiride (AMARYL) 1 MG tablet Take 1 mg by mouth daily with breakfast.   Yes [provider]  lisinopril (PRINIVIL,ZESTRIL)  20 MG tablet Take 20 mg by mouth daily.   Yes [provider]  magnesium oxide (MAG-OX) 400 MG tablet Take 500 mg by mouth daily.   Yes [provider]  Misc Natural Products (APPLE CIDER VINEGAR DIET PO) Take 450 mg by mouth daily.   Yes [provider]  Multiple Vitamin (MULTIVITAMIN) capsule Take 1 capsule by mouth daily.     Yes [provider]  omeprazole (PRILOSEC) 20 MG capsule Take 20 mg by mouth daily.   Yes [provider]  Semaglutide 14 MG TABS Take by mouth daily. Or placebo   Yes [provider]  tamsulosin (FLOMAX) 0.4 MG CAPS capsule Take 1 capsule (0.4 mg total) by mouth  2 (two) times daily. 05/01/18  Yes Stoioff, Ronda Fairly, MD  torsemide (DEMADEX) 20 MG tablet Take 20 mg by mouth daily.   Yes [provider]  vitamin E 400 UNIT capsule Take 400 Units by mouth daily.   Yes [provider]  Vitamins C E (VITAMIN C & E COMBINATION) 500-400 MG-UNIT CAPS Take 1 capsule by mouth daily.    Yes [provider]    Review of Systems  Constitutional: Positive for fatigue (minimal). Negative for appetite change.  HENT: Positive for rhinorrhea. Negative for congestion, postnasal drip and sore throat.   Eyes: Negative.   Respiratory: Negative for chest tightness and shortness of breath.   Cardiovascular: Positive for leg swelling. Negative for chest pain and palpitations.  Gastrointestinal: Negative for abdominal distention and abdominal pain.  Endocrine: Negative.   Genitourinary: Negative for difficulty urinating and hematuria.  Musculoskeletal: Negative for back pain and neck pain.  Skin: Negative.   Allergic/Immunologic: Negative.   Neurological: Positive for dizziness (intermittent). Negative for light-headedness.  Hematological: Negative for adenopathy. Does not bruise/bleed easily.  Psychiatric/Behavioral: Negative for dysphoric mood and sleep disturbance. The patient is not nervous/anxious.    Vitals:   04/15/19 1002  BP: 129/67  Pulse: 77  Resp: 18  SpO2: 100%  Weight: 190 lb 8 oz (86.4 kg)  Height: 5\' 6"  (1.676 m)   Wt Readings from Last 3 Encounters:  04/15/19 190 lb 8 oz (86.4 kg)  04/11/19 185 lb (83.9 kg)  10/15/18 186 lb 12.8 oz (84.7 kg)   Lab Results  Component Value Date   CREATININE 1.76 (H) 09/06/2017   CREATININE 1.87 (H) 09/05/2017   CREATININE 2.01 (H) 08/18/2017    Physical Exam  Constitutional: Bill Peterson is oriented to person, place, and time. Bill Peterson appears well-developed and well-nourished.  HENT:  Head: Normocephalic and atraumatic.  Neck: Normal range of motion. Neck supple. No JVD present.  Cardiovascular:  Normal rate and regular rhythm.  Pulmonary/Chest: Effort normal. No respiratory distress. Bill Peterson has no wheezes. Bill Peterson has no rales.  Abdominal: Soft. Bill Peterson exhibits no distension. There is no abdominal tenderness.  Musculoskeletal:        General: Edema (1+ pitting edema bilateral lower legs) present. No tenderness.  Neurological: Bill Peterson is alert and oriented to person, place, and time.  Skin: Skin is warm and dry.  Psychiatric: Bill Peterson has a normal mood and affect. His behavior is normal. Thought content normal.  Nursing note and vitals reviewed.  Assessment & Plan:  1: Chronic heart failure with preserved ejection fraction- - NYHA class II - euvolemic today - weighing daily and Bill Peterson was reminded to call for an overnight weight gain of >2 pounds or a weekly weight gain of >5 pounds - weight up 4 pounds from last  visit 6 months ago - not adding salt and is using Mrs Deliah Boston seasoning instead. Reminded to closely follow a 2000mg  sodium diet - BNP from 09/05/17 was 274.0 - saw cardiology Clayborn Bigness) 09/24/2018  2: HTN- - BP looks good today - BMP from 08/01/18 reviewed and showed sodium 140, potassium 4.4, creatinine 2.0 and GFR 32 - saw PCP Ouida Sills) 02/24/2019  3: Diabetes-  - A1c from 02/17/2019 was 9.2% - saw nephrology Juleen China) May 2020  4: Lymphedema- - stage 2 - has been wearing compression socks daily but edema continues - has been elevating his legs more often with a reduction in his edema - again discussed lymphapress compression boots with patient but Bill Peterson's not currently interested at this time - if edema persists, consider stopping amlodipine to see if edema improves   Medication bottles were reviewed.  Return in 6 months or sooner for any questions/problems before then.

## 2019-04-14 NOTE — Progress Notes (Signed)
04/11/2019 6:00 PM   HOLTEN SPANO 24-Nov-1934 [id number]  Referring provider: Kirk Ruths, MD [facility] [place],  [place] [address]  Chief Complaint  Patient presents with  . Follow-up    Urologic history: 1.  Elevated PSA  -Biopsy January 2020 PSA 9.2 (4K 88%)  -Prostate volume 145 g  -Small focus of atypical glands suspicious for low-grade prostate cancer left base  -Acute/chronic inflammatory changes  -Started finasteride based on prostate volume  HPI: Mr. Bill Peterson presents for semiannual follow-up.  He remains on finasteride.  An uncorrected PSA drawn on 03/28/2019 was 7.9.  He has no bothersome lower urinary tract symptoms.   PMH: Past Medical History:  Diagnosis Date  . BPH (benign prostatic hyperplasia)   . CHF (congestive heart failure) ([place])   . Diabetes mellitus without complication ([place])   . Hyperlipidemia   . Hypertension     Surgical History: Past Surgical History:  Procedure Laterality Date  . BACK SURGERY    . BIOPSY PROSTATE  08/2018  . CARDIAC SURGERY    . STOMACH SURGERY      Home Medications:  Allergies as of 04/11/2019      Reactions   Heparin    REACTION: Unspecified   Repaglinide Other (See Comments)   Increased appetite Increased appetite Increased appetite      Medication List       Accurate as of April 11, 2019 11:59 PM. If you have any questions, ask your nurse or doctor.        amLODipine 5 MG tablet Commonly known as: NORVASC Take 5 mg by mouth daily.   aspirin 325 MG tablet Take 325 mg by mouth daily.   atorvastatin 20 MG tablet Commonly known as: LIPITOR Take 20 mg by mouth daily.   carvedilol 12.5 MG tablet Commonly known as: COREG Take 12.5 mg by mouth 2 (two) times daily with a meal.   CINNAMON PO Take 1,000 tablets by mouth daily at 6 (six) AM.   finasteride 5 MG tablet Commonly known as: PROSCAR Take 1 tablet (5 mg total) by mouth daily.   Garlic 4010 MG  Caps Take 1,000 mg by mouth daily at 6 (six) AM.   Ginger Root 550 MG Caps Take 1 capsule by mouth daily at 6 (six) AM.   glimepiride 1 MG tablet Commonly known as: AMARYL Take 1 mg by mouth daily with breakfast.   lisinopril 20 MG tablet Commonly known as: ZESTRIL Take 20 mg by mouth daily.   magnesium oxide 400 MG tablet Commonly known as: MAG-OX Take 500 mg by mouth daily.   multivitamin capsule Take 1 capsule by mouth daily.   omeprazole 20 MG capsule Commonly known as: PRILOSEC Take 20 mg by mouth daily.   Semaglutide 14 MG Tabs Take by mouth daily. Or placebo   tamsulosin 0.4 MG Caps capsule Commonly known as: FLOMAX Take 1 capsule (0.4 mg total) by mouth 2 (two) times daily.   torsemide 20 MG tablet Commonly known as: DEMADEX Take 20 mg by mouth daily.   Vitamin C & E Combination 500-400 MG-UNIT Caps Take 1 capsule by mouth daily.   Vitamin D3 10 MCG (400 UNIT) tablet Take 400 Units by mouth daily.       Allergies:  Allergies  Allergen Reactions  . Heparin     REACTION: Unspecified  . Repaglinide Other (See Comments)    Increased appetite Increased appetite Increased appetite  Family History: Family History  Problem Relation Age of Onset  . Heart disease Mother   . Diabetes Father   . Prostate cancer Neg Hx   . Kidney cancer Neg Hx   . Bladder Cancer Neg Hx     Social History:  reports that he has never smoked. He has never used smokeless tobacco. He reports that he does not drink alcohol or use drugs.  ROS: UROLOGY Frequent Urination?: No Hard to postpone urination?: No Burning/pain with urination?: No Get up at night to urinate?: No Leakage of urine?: No Urine stream starts and stops?: No Trouble starting stream?: No Do you have to strain to urinate?: No Blood in urine?: No Urinary tract infection?: No Sexually transmitted disease?: No Injury to kidneys or bladder?: No Painful intercourse?: No Weak stream?: No Erection  problems?: No Penile pain?: No  Gastrointestinal Nausea?: No Vomiting?: No Indigestion/heartburn?: No Diarrhea?: No Constipation?: No  Constitutional Fever: No Night sweats?: No Weight loss?: No Fatigue?: No  Skin Skin rash/lesions?: No Itching?: No  Eyes Blurred vision?: No Double vision?: No  Ears/Nose/Throat Sore throat?: No Sinus problems?: No  Hematologic/Lymphatic Swollen glands?: No Easy bruising?: No  Cardiovascular Leg swelling?: No Chest pain?: No  Respiratory Cough?: No Shortness of breath?: No  Endocrine Excessive thirst?: No  Musculoskeletal Back pain?: No Joint pain?: No  Neurological Headaches?: No Dizziness?: No  Psychologic Depression?: No Anxiety?: No  Physical Exam: BP 117/67 (BP Location: Left Arm, Patient Position: Sitting, Cuff Size: Normal)   Pulse 82   Ht 5\' 6"  (1.676 m)   Wt 185 lb (83.9 kg)   BMI 29.86 kg/m   Constitutional:  Alert and oriented, No acute distress. HEENT: [place] AT, moist mucus membranes.  Trachea midline, no masses. Cardiovascular: No clubbing, cyanosis, or edema. Respiratory: Normal respiratory effort, no increased work of breathing. GI: Abdomen is soft, nontender, nondistended, no abdominal masses GU: No CVA tenderness.  Prostate 80+ cc, smooth without nodules Lymph: No cervical or inguinal lymphadenopathy. Skin: No rashes, bruises or suspicious lesions. Neurologic: Grossly intact, no focal deficits, moving all 4 extremities. Psychiatric: Normal mood and affect.   Assessment & Plan:    - Elevated PSA PSA on finasteride has not decreased appropriately.  DRE is benign.  Will repeat a PSA in 3 months.  Continue finasteride.  Further recommendations pending follow-up PSA.   Abbie Sons, [place] [address] [place], [place] [address] [phone]

## 2019-04-15 ENCOUNTER — Ambulatory Visit: Payer: Medicare Other | Attending: Family | Admitting: Family

## 2019-04-15 ENCOUNTER — Other Ambulatory Visit: Payer: Self-pay

## 2019-04-15 ENCOUNTER — Encounter: Payer: Self-pay | Admitting: Family

## 2019-04-15 VITALS — BP 129/67 | HR 77 | Resp 18 | Ht 66.0 in | Wt 190.5 lb

## 2019-04-15 DIAGNOSIS — Z7984 Long term (current) use of oral hypoglycemic drugs: Secondary | ICD-10-CM | POA: Insufficient documentation

## 2019-04-15 DIAGNOSIS — N183 Chronic kidney disease, stage 3 unspecified: Secondary | ICD-10-CM

## 2019-04-15 DIAGNOSIS — I890 Lymphedema, not elsewhere classified: Secondary | ICD-10-CM | POA: Diagnosis not present

## 2019-04-15 DIAGNOSIS — N400 Enlarged prostate without lower urinary tract symptoms: Secondary | ICD-10-CM | POA: Insufficient documentation

## 2019-04-15 DIAGNOSIS — I5032 Chronic diastolic (congestive) heart failure: Secondary | ICD-10-CM | POA: Insufficient documentation

## 2019-04-15 DIAGNOSIS — Z79899 Other long term (current) drug therapy: Secondary | ICD-10-CM | POA: Insufficient documentation

## 2019-04-15 DIAGNOSIS — I110 Hypertensive heart disease with heart failure: Secondary | ICD-10-CM | POA: Insufficient documentation

## 2019-04-15 DIAGNOSIS — Z8249 Family history of ischemic heart disease and other diseases of the circulatory system: Secondary | ICD-10-CM | POA: Insufficient documentation

## 2019-04-15 DIAGNOSIS — E785 Hyperlipidemia, unspecified: Secondary | ICD-10-CM | POA: Insufficient documentation

## 2019-04-15 DIAGNOSIS — E1122 Type 2 diabetes mellitus with diabetic chronic kidney disease: Secondary | ICD-10-CM

## 2019-04-15 DIAGNOSIS — E119 Type 2 diabetes mellitus without complications: Secondary | ICD-10-CM | POA: Insufficient documentation

## 2019-04-15 DIAGNOSIS — Z888 Allergy status to other drugs, medicaments and biological substances status: Secondary | ICD-10-CM | POA: Insufficient documentation

## 2019-04-15 DIAGNOSIS — Z7982 Long term (current) use of aspirin: Secondary | ICD-10-CM | POA: Diagnosis not present

## 2019-04-15 DIAGNOSIS — I10 Essential (primary) hypertension: Secondary | ICD-10-CM

## 2019-04-15 DIAGNOSIS — Z833 Family history of diabetes mellitus: Secondary | ICD-10-CM | POA: Diagnosis not present

## 2019-04-15 NOTE — Patient Instructions (Signed)
Continue weighing daily and call for an overnight weight gain of > 2 pounds or a weekly weight gain of >5 pounds. 

## 2019-05-21 DIAGNOSIS — R319 Hematuria, unspecified: Secondary | ICD-10-CM | POA: Insufficient documentation

## 2019-05-21 DIAGNOSIS — I129 Hypertensive chronic kidney disease with stage 1 through stage 4 chronic kidney disease, or unspecified chronic kidney disease: Secondary | ICD-10-CM | POA: Insufficient documentation

## 2019-05-21 DIAGNOSIS — N2581 Secondary hyperparathyroidism of renal origin: Secondary | ICD-10-CM | POA: Insufficient documentation

## 2019-05-21 DIAGNOSIS — D631 Anemia in chronic kidney disease: Secondary | ICD-10-CM | POA: Insufficient documentation

## 2019-05-21 DIAGNOSIS — N1832 Chronic kidney disease, stage 3b: Secondary | ICD-10-CM | POA: Insufficient documentation

## 2019-05-21 DIAGNOSIS — N189 Chronic kidney disease, unspecified: Secondary | ICD-10-CM | POA: Insufficient documentation

## 2019-06-24 ENCOUNTER — Other Ambulatory Visit: Payer: Self-pay | Admitting: Urology

## 2019-06-24 ENCOUNTER — Telehealth: Payer: Self-pay | Admitting: Urology

## 2019-06-24 DIAGNOSIS — N401 Enlarged prostate with lower urinary tract symptoms: Secondary | ICD-10-CM

## 2019-06-24 DIAGNOSIS — R3914 Feeling of incomplete bladder emptying: Secondary | ICD-10-CM

## 2019-06-24 MED ORDER — FINASTERIDE 5 MG PO TABS: 5.0000 mg | ORAL_TABLET | Freq: Every day | ORAL | 11 refills | Status: DC

## 2019-06-24 NOTE — Telephone Encounter (Signed)
Pt. States refill was supposed to be called in after his last appointment in office on 04/11/19. He is completely out and would like this filled as soon as possible.Please send to [facility], [place].

## 2019-06-24 NOTE — Telephone Encounter (Signed)
Refill sent.

## 2019-07-02 ENCOUNTER — Other Ambulatory Visit: Payer: Self-pay

## 2019-07-02 DIAGNOSIS — R3914 Feeling of incomplete bladder emptying: Secondary | ICD-10-CM

## 2019-07-02 DIAGNOSIS — N401 Enlarged prostate with lower urinary tract symptoms: Secondary | ICD-10-CM

## 2019-07-02 MED ORDER — TAMSULOSIN HCL 0.4 MG PO CAPS: 0.4000 mg | ORAL_CAPSULE | Freq: Two times a day (BID) | ORAL | 3 refills | Status: DC

## 2019-07-02 NOTE — Telephone Encounter (Signed)
Pt took his last pill today and needs a refill for Tamsulosin sent to [facility].

## 2019-07-09 ENCOUNTER — Other Ambulatory Visit: Payer: Self-pay

## 2019-07-09 DIAGNOSIS — R972 Elevated prostate specific antigen [PSA]: Secondary | ICD-10-CM

## 2019-07-10 ENCOUNTER — Other Ambulatory Visit: Payer: Self-pay

## 2019-07-10 ENCOUNTER — Other Ambulatory Visit: Payer: Medicare Other

## 2019-07-10 DIAGNOSIS — R972 Elevated prostate specific antigen [PSA]: Secondary | ICD-10-CM

## 2019-07-11 ENCOUNTER — Other Ambulatory Visit: Payer: Medicare Other

## 2019-07-11 LAB — PSA: Prostate Specific Ag, Serum: 8.1 ng/mL — ABNORMAL HIGH (ref 0.0–4.0)

## 2019-07-13 ENCOUNTER — Other Ambulatory Visit: Payer: Self-pay | Admitting: Urology

## 2019-07-13 DIAGNOSIS — R972 Elevated prostate specific antigen [PSA]: Secondary | ICD-10-CM

## 2019-07-14 ENCOUNTER — Telehealth: Payer: Self-pay

## 2019-07-14 NOTE — Telephone Encounter (Signed)
-----   Message from Abbie Sons, MD sent at 07/13/2019  7:37 PM EST ----- PSA remains elevated at 8.1.  Recommend prostate MRI.  Order was entered.  Will call with results.

## 2019-07-14 NOTE — Telephone Encounter (Signed)
Called pt no answer. No voicemail. 1st attempt.

## 2019-07-15 NOTE — Telephone Encounter (Signed)
Called pt no answer. 2nd attempt.  °

## 2019-07-17 NOTE — Telephone Encounter (Signed)
Called pt, line rang busy, 3rd attempt. Letter mailed.

## 2019-08-15 ENCOUNTER — Telehealth: Payer: Self-pay | Admitting: Urology

## 2019-08-15 NOTE — Telephone Encounter (Signed)
Pt called and states that he would like a call back to discuss his PSA results from November. He is very upset and wants a call back ASAP.

## 2019-08-18 NOTE — Telephone Encounter (Signed)
It closed because the patient never called scheduling back to schedule the MRI. If he wants to have it done he can call them @ [phone]

## 2019-08-18 NOTE — Telephone Encounter (Signed)
Left Vm asked to return call

## 2019-10-13 NOTE — Progress Notes (Signed)
Patient ID: Bill Peterson, male    DOB: 24-Dec-1934, 85 y.o.   MRN: [id number]  HPI  Bill Peterson is an 85 y/o male with a history of diabetes, hyperlipidemia, HTN, BPH and chronic heart failure.   Echo report from 08/16/17 reviewed and showed an EF of 65-70% along with mild AR and moderate Bill.   Had stress test done 09/04/17 which showed: LVEF= 58 % FINDINGS: Regional wall motion:reveals normal myocardial thickening and wall  motion. The overall quality of the study is good. Artifacts noted: no Left ventricular cavity: Mild left ventricular enlargement Perfusion Analysis:SPECT images demonstrate homogeneous tracer  distribution throughout the myocardium.  Has not been admitted or been in the ED in the last 6 months.   Bill Peterson presents today for a follow-up visit with a chief complaint of minimal shortness of breath upon moderate exertion. Bill Peterson describes this as chronic in nature having been present for several years. Bill Peterson has associated fatigue, pedal edema and dizziness along with this. Bill Peterson denies any difficulty sleeping, abdominal distention, palpitations, chest pain or weight.   Past Medical History:  Diagnosis Date  . BPH (benign prostatic hyperplasia)   . CHF (congestive heart failure) ([place])   . Diabetes mellitus without complication ([place])   . Hyperlipidemia   . Hypertension    Past Surgical History:  Procedure Laterality Date  . BACK SURGERY    . BIOPSY PROSTATE  08/2018  . CARDIAC SURGERY    . STOMACH SURGERY     Family History  Problem Relation Age of Onset  . Heart disease Mother   . Diabetes Father   . Prostate cancer Neg Hx   . Kidney cancer Neg Hx   . Bladder Cancer Neg Hx    Social History   Tobacco Use  . Smoking status: Never Smoker  . Smokeless tobacco: Never Used  Substance Use Topics  . Alcohol use: No   Allergies  Allergen Reactions  . Heparin     REACTION: Unspecified  . Repaglinide Other (See Comments)    Increased appetite Increased  appetite Increased appetite    Prior to Admission medications   Medication Sig Start Date End Date Taking? Authorizing Provider  amLODipine (NORVASC) 5 MG tablet Take 5 mg by mouth daily.    Yes [provider]  aspirin 325 MG tablet Take 325 mg by mouth daily.     Yes [provider]  atorvastatin (LIPITOR) 20 MG tablet Take 20 mg by mouth daily.   Yes [provider]  carvedilol (COREG) 12.5 MG tablet Take 12.5 mg by mouth 2 (two) times daily with a meal.   Yes [provider]  Cholecalciferol (VITAMIN D3) 10 MCG (400 UNIT) tablet Take 400 Units by mouth daily.   Yes [provider]  CINNAMON PO Take 1,000 tablets by mouth daily at 6 (six) AM.   Yes [provider]  finasteride (PROSCAR) 5 MG tablet Take 1 tablet (5 mg total) by mouth daily. 06/24/19  Yes Stoioff, Ronda Fairly, MD  Garlic 5329 MG CAPS Take 1,000 mg by mouth daily at 6 (six) AM.   Yes [provider]  Ginger, Zingiber officinalis, (GINGER ROOT) 550 MG CAPS Take 1 capsule by mouth daily at 6 (six) AM.   Yes [provider]  glimepiride (AMARYL) 1 MG tablet Take 2 mg by mouth daily with breakfast.    Yes [provider]  lisinopril (PRINIVIL,ZESTRIL) 20 MG tablet Take 20 mg by mouth daily.   Yes  [provider]  magnesium oxide (MAG-OX) 400 MG tablet Take 500 mg by mouth daily.   Yes [provider]  Misc Natural Products (APPLE CIDER VINEGAR DIET PO) Take 450 mg by mouth daily.   Yes [provider]  Multiple Vitamin (MULTIVITAMIN) capsule Take 1 capsule by mouth daily.     Yes [provider]  omeprazole (PRILOSEC) 20 MG capsule Take 20 mg by mouth daily.   Yes [provider]  Semaglutide 14 MG TABS Take by mouth daily. Or placebo   Yes [provider]  tamsulosin (FLOMAX) 0.4 MG CAPS capsule Take 1 capsule (0.4 mg total) by mouth 2 (two) times daily. 07/02/19  Yes Stoioff, Ronda Fairly, MD  torsemide  (DEMADEX) 20 MG tablet Take 20 mg by mouth daily.   Yes [provider]  Turmeric 500 MG CAPS Take 500 mg by mouth daily.   Yes [provider]  vitamin E 400 UNIT capsule Take 400 Units by mouth daily.   Yes [provider]  Vitamins C E (VITAMIN C & E COMBINATION) 500-400 MG-UNIT CAPS Take 1 capsule by mouth daily.    Yes [provider]     Review of Systems  Constitutional: Positive for fatigue (minimal). Negative for appetite change.  HENT: Positive for rhinorrhea. Negative for congestion, postnasal drip and sore throat.   Eyes: Negative.   Respiratory: Positive for shortness of breath (minimal). Negative for chest tightness.   Cardiovascular: Positive for leg swelling. Negative for chest pain and palpitations.  Gastrointestinal: Negative for abdominal distention and abdominal pain.  Endocrine: Negative.   Genitourinary: Negative for difficulty urinating and hematuria.  Musculoskeletal: Negative for back pain and neck pain.  Skin: Negative.   Allergic/Immunologic: Negative.   Neurological: Positive for dizziness (intermittent). Negative for light-headedness.  Hematological: Negative for adenopathy. Does not bruise/bleed easily.  Psychiatric/Behavioral: Negative for dysphoric mood and sleep disturbance. The patient is not nervous/anxious.    Vitals:   10/14/19 0945  BP: (!) 155/83  Pulse: 98  Resp: 16  SpO2: 98%  Weight: 191 lb (86.6 kg)  Height: 5\' 6"  (1.676 m)   Wt Readings from Last 3 Encounters:  10/14/19 191 lb (86.6 kg)  04/15/19 190 lb 8 oz (86.4 kg)  04/11/19 185 lb (83.9 kg)   Lab Results  Component Value Date   CREATININE 1.76 (H) 09/06/2017   CREATININE 1.87 (H) 09/05/2017   CREATININE 2.01 (H) 08/18/2017     Physical Exam  Constitutional: Bill Peterson is oriented to person, place, and time. Bill Peterson appears well-developed and well-nourished.  HENT:  Head: Normocephalic and atraumatic.  Neck: No JVD present.  Cardiovascular: Normal  rate and regular rhythm.  Pulmonary/Chest: Effort normal. No respiratory distress. Bill Peterson has no wheezes. Bill Peterson has no rales.  Abdominal: Soft. Bill Peterson exhibits no distension. There is no abdominal tenderness.  Musculoskeletal:        General: Edema (1+ pitting edema bilateral lower legs with R>L) present. No tenderness.     Cervical back: Normal range of motion and neck supple.  Neurological: Bill Peterson is alert and oriented to person, place, and time.  Skin: Skin is warm and dry.  Psychiatric: Bill Peterson has a normal mood and affect. His behavior is normal. Thought content normal.  Nursing note and vitals reviewed.  Assessment & Plan:  1: Chronic heart failure with preserved ejection fraction- - NYHA class II - euvolemic today - weighing daily and Bill Peterson was reminded to call for an overnight weight gain of >2 pounds  or a weekly weight gain of >5 pounds - weight stable from last visit 6 months ago - updated echo scheduled for 12/10/19 - not adding salt and is using Mrs Deliah Boston seasoning instead. Reminded to closely follow a 2000mg  sodium diet - BNP from 09/05/17 was 274.0 - saw cardiology Clayborn Bigness) 09/15/19  2: HTN- - BP mildly elevated today - BMP from 05/15/2019 reviewed and showed sodium 140, potassium 4.3, creatinine 1.88 and GFR 38 - saw PCP Ouida Sills) 06/26/2019  3: Diabetes-  - A1c from 02/17/2019 was 9.2% - saw nephrology (Kolluru) 05/21/2019 - nonfasting glucose in clinic today was 280; patient ate pimento cheese sandwich this morning  4: Lymphedema- - stage 2 - has been wearing compression socks daily but edema continues - has been elevating his legs more often with a reduction in his edema - does do some walking but edema persists - discussed compression boots again but Bill Peterson wants to think it over; brochure provided and advised patient to call us back if Bill Peterson would like [place] to send a referral in  Medication bottles were reviewed.  Return in 6 months or sooner for any questions/problems before then.

## 2019-10-14 ENCOUNTER — Other Ambulatory Visit: Payer: Self-pay

## 2019-10-14 ENCOUNTER — Ambulatory Visit: Payer: Medicare Other | Attending: Family | Admitting: Family

## 2019-10-14 ENCOUNTER — Encounter: Payer: Self-pay | Admitting: Family

## 2019-10-14 VITALS — BP 155/83 | HR 98 | Resp 16 | Ht 66.0 in | Wt 191.0 lb

## 2019-10-14 DIAGNOSIS — Z8249 Family history of ischemic heart disease and other diseases of the circulatory system: Secondary | ICD-10-CM | POA: Insufficient documentation

## 2019-10-14 DIAGNOSIS — Z79899 Other long term (current) drug therapy: Secondary | ICD-10-CM | POA: Insufficient documentation

## 2019-10-14 DIAGNOSIS — R0602 Shortness of breath: Secondary | ICD-10-CM | POA: Insufficient documentation

## 2019-10-14 DIAGNOSIS — I110 Hypertensive heart disease with heart failure: Secondary | ICD-10-CM | POA: Diagnosis not present

## 2019-10-14 DIAGNOSIS — Z7982 Long term (current) use of aspirin: Secondary | ICD-10-CM | POA: Insufficient documentation

## 2019-10-14 DIAGNOSIS — E1122 Type 2 diabetes mellitus with diabetic chronic kidney disease: Secondary | ICD-10-CM

## 2019-10-14 DIAGNOSIS — M7989 Other specified soft tissue disorders: Secondary | ICD-10-CM | POA: Diagnosis not present

## 2019-10-14 DIAGNOSIS — N1832 Chronic kidney disease, stage 3b: Secondary | ICD-10-CM | POA: Insufficient documentation

## 2019-10-14 DIAGNOSIS — R5383 Other fatigue: Secondary | ICD-10-CM | POA: Diagnosis not present

## 2019-10-14 DIAGNOSIS — I890 Lymphedema, not elsewhere classified: Secondary | ICD-10-CM | POA: Insufficient documentation

## 2019-10-14 DIAGNOSIS — Z7984 Long term (current) use of oral hypoglycemic drugs: Secondary | ICD-10-CM | POA: Diagnosis not present

## 2019-10-14 DIAGNOSIS — I5032 Chronic diastolic (congestive) heart failure: Secondary | ICD-10-CM | POA: Diagnosis not present

## 2019-10-14 DIAGNOSIS — E1121 Type 2 diabetes mellitus with diabetic nephropathy: Secondary | ICD-10-CM | POA: Insufficient documentation

## 2019-10-14 DIAGNOSIS — I10 Essential (primary) hypertension: Secondary | ICD-10-CM

## 2019-10-14 DIAGNOSIS — E785 Hyperlipidemia, unspecified: Secondary | ICD-10-CM | POA: Diagnosis not present

## 2019-10-14 LAB — GLUCOSE, CAPILLARY: Glucose-Capillary: 280 mg/dL — ABNORMAL HIGH (ref 70–99)

## 2019-10-14 NOTE — Patient Instructions (Signed)
Continue weighing daily and call for an overnight weight gain of > 2 pounds or a weekly weight gain of >5 pounds. 

## 2019-12-10 ENCOUNTER — Ambulatory Visit
Admission: RE | Admit: 2019-12-10 | Discharge: 2019-12-10 | Disposition: A | Discharge status: Home / Self Care | Payer: Medicare Other | Source: Ambulatory Visit | Attending: Family | Admitting: Family

## 2019-12-10 ENCOUNTER — Other Ambulatory Visit: Payer: Self-pay

## 2019-12-10 DIAGNOSIS — N189 Chronic kidney disease, unspecified: Secondary | ICD-10-CM | POA: Diagnosis not present

## 2019-12-10 DIAGNOSIS — I130 Hypertensive heart and chronic kidney disease with heart failure and stage 1 through stage 4 chronic kidney disease, or unspecified chronic kidney disease: Secondary | ICD-10-CM | POA: Diagnosis not present

## 2019-12-10 DIAGNOSIS — E1122 Type 2 diabetes mellitus with diabetic chronic kidney disease: Secondary | ICD-10-CM | POA: Diagnosis not present

## 2019-12-10 DIAGNOSIS — E785 Hyperlipidemia, unspecified: Secondary | ICD-10-CM | POA: Diagnosis not present

## 2019-12-10 DIAGNOSIS — I080 Rheumatic disorders of both mitral and aortic valves: Secondary | ICD-10-CM | POA: Diagnosis not present

## 2019-12-10 DIAGNOSIS — I5032 Chronic diastolic (congestive) heart failure: Secondary | ICD-10-CM | POA: Diagnosis present

## 2019-12-10 NOTE — Progress Notes (Signed)
*  PRELIMINARY RESULTS* Echocardiogram 2D Echocardiogram has been performed.  Wallie Char Niam Nepomuceno 12/10/2019, 11:50 AM

## 2020-04-12 ENCOUNTER — Ambulatory Visit: Payer: Medicare Other | Admitting: Family

## 2020-04-14 ENCOUNTER — Ambulatory Visit: Payer: Medicare Other | Admitting: Family

## 2020-04-22 ENCOUNTER — Ambulatory Visit: Payer: Medicare Other | Attending: Family | Admitting: Family

## 2020-04-22 ENCOUNTER — Encounter: Payer: Self-pay | Admitting: Family

## 2020-04-22 ENCOUNTER — Other Ambulatory Visit: Payer: Self-pay

## 2020-04-22 VITALS — BP 116/63 | HR 71 | Resp 18 | Ht 66.0 in | Wt 183.4 lb

## 2020-04-22 DIAGNOSIS — M7989 Other specified soft tissue disorders: Secondary | ICD-10-CM | POA: Insufficient documentation

## 2020-04-22 DIAGNOSIS — R0602 Shortness of breath: Secondary | ICD-10-CM | POA: Diagnosis present

## 2020-04-22 DIAGNOSIS — I110 Hypertensive heart disease with heart failure: Secondary | ICD-10-CM | POA: Insufficient documentation

## 2020-04-22 DIAGNOSIS — R5383 Other fatigue: Secondary | ICD-10-CM | POA: Insufficient documentation

## 2020-04-22 DIAGNOSIS — Z7982 Long term (current) use of aspirin: Secondary | ICD-10-CM | POA: Diagnosis not present

## 2020-04-22 DIAGNOSIS — R609 Edema, unspecified: Secondary | ICD-10-CM | POA: Diagnosis not present

## 2020-04-22 DIAGNOSIS — N1832 Chronic kidney disease, stage 3b: Secondary | ICD-10-CM

## 2020-04-22 DIAGNOSIS — E785 Hyperlipidemia, unspecified: Secondary | ICD-10-CM | POA: Diagnosis not present

## 2020-04-22 DIAGNOSIS — Z7901 Long term (current) use of anticoagulants: Secondary | ICD-10-CM | POA: Insufficient documentation

## 2020-04-22 DIAGNOSIS — Z79899 Other long term (current) drug therapy: Secondary | ICD-10-CM | POA: Insufficient documentation

## 2020-04-22 DIAGNOSIS — I10 Essential (primary) hypertension: Secondary | ICD-10-CM

## 2020-04-22 DIAGNOSIS — R42 Dizziness and giddiness: Secondary | ICD-10-CM | POA: Diagnosis not present

## 2020-04-22 DIAGNOSIS — Z833 Family history of diabetes mellitus: Secondary | ICD-10-CM | POA: Insufficient documentation

## 2020-04-22 DIAGNOSIS — I5032 Chronic diastolic (congestive) heart failure: Secondary | ICD-10-CM

## 2020-04-22 DIAGNOSIS — Z7984 Long term (current) use of oral hypoglycemic drugs: Secondary | ICD-10-CM | POA: Insufficient documentation

## 2020-04-22 DIAGNOSIS — I890 Lymphedema, not elsewhere classified: Secondary | ICD-10-CM

## 2020-04-22 DIAGNOSIS — E119 Type 2 diabetes mellitus without complications: Secondary | ICD-10-CM | POA: Insufficient documentation

## 2020-04-22 LAB — GLUCOSE, CAPILLARY: Glucose-Capillary: 177 mg/dL — ABNORMAL HIGH (ref 70–99)

## 2020-04-22 NOTE — Patient Instructions (Signed)
Continue weighing daily and call for an overnight weight gain of > 2 pounds or a weekly weight gain of >5 pounds. 

## 2020-04-22 NOTE — Progress Notes (Signed)
Patient ID: Bill Peterson, male    DOB: 1935-08-22, 85 y.o.   MRN: [id number]  HPI  Mr Berthold is an 85 y/o male with a history of diabetes, hyperlipidemia, HTN, BPH and chronic heart failure.   Echo report from 12/10/19 reviewed and showed an EF of 70-75% along with mild LVH, mild MR, mild/moderate AS and moderately elevated PA pressure. Echo report from 08/16/17 reviewed and showed an EF of 65-70% along with mild AR and moderate MR.   Had stress test done 09/04/17 which showed: LVEF= 58 % FINDINGS: Regional wall motion:reveals normal myocardial thickening and wall  motion. The overall quality of the study is good. Artifacts noted: no Left ventricular cavity: Mild left ventricular enlargement Perfusion Analysis:SPECT images demonstrate homogeneous tracer  distribution throughout the myocardium.  Has not been admitted or been in the ED in the last 6 months.   He presents today for a follow-up visit with a chief complaint of minimal shortness of breath upon moderate exertion. He describes this as chronic in nature having been present for several years. He has associated fatigue, pedal edema and dizziness along with this. He denies any difficulty sleeping, abdominal distention, palpitations, chest pain, cough or weight gain.   Has worn compression socks in the past but not consistently.   Past Medical History:  Diagnosis Date  . BPH (benign prostatic hyperplasia)   . CHF (congestive heart failure) ([place])   . Diabetes mellitus without complication ([place])   . Hyperlipidemia   . Hypertension    Past Surgical History:  Procedure Laterality Date  . BACK SURGERY    . BIOPSY PROSTATE  08/2018  . CARDIAC SURGERY    . STOMACH SURGERY     Family History  Problem Relation Age of Onset  . Heart disease Mother   . Diabetes Father   . Prostate cancer Neg Hx   . Kidney cancer Neg Hx   . Bladder Cancer Neg Hx    Social History   Tobacco Use  . Smoking status: Never Smoker  .  Smokeless tobacco: Never Used  Substance Use Topics  . Alcohol use: No   Allergies  Allergen Reactions  . Heparin     REACTION: Unspecified  . Repaglinide Other (See Comments)    Increased appetite Increased appetite Increased appetite    Prior to Admission medications   Medication Sig Start Date End Date Taking? Authorizing Provider  amLODipine (NORVASC) 5 MG tablet Take 5 mg by mouth daily.    Yes [provider]  aspirin 325 MG tablet Take 325 mg by mouth daily.     Yes [provider]  atorvastatin (LIPITOR) 20 MG tablet Take 20 mg by mouth daily.   Yes [provider]  carvedilol (COREG) 12.5 MG tablet Take 12.5 mg by mouth 2 (two) times daily with a meal.   Yes [provider]  Cholecalciferol (VITAMIN D3) 10 MCG (400 UNIT) tablet Take 400 Units by mouth daily.   Yes [provider]  CINNAMON PO Take 1,000 tablets by mouth daily at 6 (six) AM.   Yes [provider]  finasteride (PROSCAR) 5 MG tablet Take 1 tablet (5 mg total) by mouth daily. 06/24/19  Yes Stoioff, Ronda Fairly, MD  Garlic 8938 MG CAPS Take 1,000 mg by mouth daily at 6 (six) AM.   Yes [provider]  Ginger, Zingiber officinalis, (GINGER ROOT) 550 MG CAPS Take 1 capsule by mouth daily at 6 (six) AM.   Yes [provider]  glimepiride (AMARYL) 1 MG tablet Take 2 mg by mouth daily with breakfast.    Yes [provider]  lisinopril (PRINIVIL,ZESTRIL) 20 MG tablet Take 20 mg by mouth daily.   Yes [provider]  magnesium oxide (MAG-OX) 400 MG tablet Take 500 mg by mouth daily.   Yes [provider]  Misc Natural Products (APPLE CIDER VINEGAR DIET PO) Take 450 mg by mouth daily.   Yes [provider]  Multiple Vitamin (MULTIVITAMIN) capsule Take 1 capsule by mouth daily.     Yes [provider]  omeprazole (PRILOSEC) 20 MG capsule Take 20 mg by mouth daily.   Yes [provider]  Potassium 99 MG  TABS Take 99 mg by mouth daily as needed.   Yes [provider]  Semaglutide 14 MG TABS Take by mouth daily. Or placebo   Yes [provider]  tamsulosin (FLOMAX) 0.4 MG CAPS capsule Take 1 capsule (0.4 mg total) by mouth 2 (two) times daily. 07/02/19  Yes Stoioff, Ronda Fairly, MD  torsemide (DEMADEX) 20 MG tablet Take 20 mg by mouth daily.   Yes [provider]  Turmeric 500 MG CAPS Take 500 mg by mouth daily.   Yes [provider]  vitamin E 400 UNIT capsule Take 400 Units by mouth daily.   Yes [provider]  Vitamins C E (VITAMIN C & E COMBINATION) 500-400 MG-UNIT CAPS Take 1 capsule by mouth daily.    Yes [provider]    Review of Systems  Constitutional: Positive for fatigue (at times). Negative for appetite change.  HENT: Negative for congestion, rhinorrhea and sore throat.   Eyes: Negative.   Respiratory: Positive for shortness of breath ("very little"). Negative for cough and chest tightness.   Cardiovascular: Positive for leg swelling. Negative for chest pain and palpitations.  Gastrointestinal: Negative for abdominal distention and abdominal pain.  Endocrine: Negative.   Genitourinary: Negative for difficulty urinating and hematuria.  Musculoskeletal: Negative for back pain and neck pain.  Skin: Negative.   Allergic/Immunologic: Negative.   Neurological: Positive for dizziness (intermittent). Negative for light-headedness.  Hematological: Negative for adenopathy. Does not bruise/bleed easily.  Psychiatric/Behavioral: Negative for dysphoric mood and sleep disturbance. The patient is not nervous/anxious.    Vitals:   04/22/20 0954  BP: 116/63  Pulse: 71  Resp: 18  SpO2: 100%  Weight: 183 lb 6 oz (83.2 kg)  Height: 5\' 6"  (1.676 m)   Wt Readings from Last 3 Encounters:  04/22/20 183 lb 6 oz (83.2 kg)  10/14/19 191 lb (86.6 kg)  04/15/19 190 lb 8 oz (86.4 kg)   Lab Results  Component Value Date   CREATININE 1.76 (H)  09/06/2017   CREATININE 1.87 (H) 09/05/2017   CREATININE 2.01 (H) 08/18/2017     Physical Exam Vitals and nursing note reviewed.  Constitutional:      Appearance: He is well-developed.  HENT:     Head: Normocephalic and atraumatic.  Neck:     Vascular: No JVD.  Cardiovascular:     Rate and Rhythm: Normal rate and regular rhythm.  Pulmonary:     Effort: Pulmonary effort is normal. No respiratory distress.     Breath sounds: No wheezing or rales.  Abdominal:     General: There is no distension.     Palpations: Abdomen is soft.     Tenderness: There is no abdominal tenderness.  Musculoskeletal:        General: No tenderness.  Cervical back: Normal range of motion and neck supple.     Right lower leg: Edema (2+ pitting) present.     Left lower leg: Edema (1+ pitting) present.  Skin:    General: Skin is warm and dry.  Neurological:     Mental Status: He is alert and oriented to person, place, and time.  Psychiatric:        Behavior: Behavior normal.        Thought Content: Thought content normal.    Assessment & Plan:  1: Chronic heart failure with preserved ejection fraction- - NYHA class II - euvolemic today - weighing daily and he was reminded to call for an overnight weight gain of >2 pounds or a weekly weight gain of >5 pounds - weight down 8 pounds from last visit 6 months ago - not adding salt and is using Mrs Deliah Boston seasoning instead. Reminded to closely follow a 2000mg  sodium diet - BNP from 09/05/17 was 274.0 - saw cardiology Clayborn Bigness) 09/15/19 - reports receiving both COVID vaccines  2: HTN- - BP looks good today - BMP from 02/24/20 reviewed and showed sodium 138, potassium 4.3, creatinine 2.0 and GFR 32 - saw PCP Ouida Sills) 03/02/20  3: Diabetes-  - A1c from 02/24/20 was 8.9% - saw nephrology (Kolluru) 11/05/19 - nonfasting glucose in clinic today was 177  4: Lymphedema- - stage 2 - hasn't been wearing his compression socks consistently; encouraged to try  wearing them daily but to remove them at bedtime - has been elevating his legs more often with a reduction in his edema - does do some walking but edema persists - discussed compression boots again but he'd like to try and wear the compression socks more consistently again   Medication bottles were reviewed.  Return in 6 months or sooner for any questions/problems before then.

## 2020-07-05 DIAGNOSIS — I350 Nonrheumatic aortic (valve) stenosis: Secondary | ICD-10-CM | POA: Insufficient documentation

## 2020-10-19 ENCOUNTER — Other Ambulatory Visit: Payer: Self-pay

## 2020-10-19 ENCOUNTER — Ambulatory Visit: Payer: Medicare Other | Attending: Family | Admitting: Family

## 2020-10-19 ENCOUNTER — Encounter: Payer: Self-pay | Admitting: Family

## 2020-10-19 VITALS — BP 137/74 | HR 82 | Resp 18 | Ht 66.0 in | Wt 183.4 lb

## 2020-10-19 DIAGNOSIS — I509 Heart failure, unspecified: Secondary | ICD-10-CM | POA: Diagnosis present

## 2020-10-19 DIAGNOSIS — Z8249 Family history of ischemic heart disease and other diseases of the circulatory system: Secondary | ICD-10-CM | POA: Insufficient documentation

## 2020-10-19 DIAGNOSIS — Z7982 Long term (current) use of aspirin: Secondary | ICD-10-CM | POA: Diagnosis not present

## 2020-10-19 DIAGNOSIS — I110 Hypertensive heart disease with heart failure: Secondary | ICD-10-CM | POA: Insufficient documentation

## 2020-10-19 DIAGNOSIS — E785 Hyperlipidemia, unspecified: Secondary | ICD-10-CM | POA: Diagnosis not present

## 2020-10-19 DIAGNOSIS — E119 Type 2 diabetes mellitus without complications: Secondary | ICD-10-CM | POA: Diagnosis not present

## 2020-10-19 DIAGNOSIS — I5032 Chronic diastolic (congestive) heart failure: Secondary | ICD-10-CM

## 2020-10-19 DIAGNOSIS — Z7984 Long term (current) use of oral hypoglycemic drugs: Secondary | ICD-10-CM | POA: Diagnosis not present

## 2020-10-19 DIAGNOSIS — I890 Lymphedema, not elsewhere classified: Secondary | ICD-10-CM | POA: Diagnosis not present

## 2020-10-19 DIAGNOSIS — I10 Essential (primary) hypertension: Secondary | ICD-10-CM

## 2020-10-19 DIAGNOSIS — N1832 Chronic kidney disease, stage 3b: Secondary | ICD-10-CM

## 2020-10-19 NOTE — Progress Notes (Signed)
Patient ID: Bill Peterson, male    DOB: Nov 05, 1934, 86 y.o.   MRN: [id number]   Bill Peterson is an 86 y/o male with a history of diabetes, hyperlipidemia, HTN, BPH and chronic heart failure.   Echo report from 12/10/19 reviewed and showed an EF of 70-75% along with mild LVH, mild Bill, mild/moderate AS and moderately elevated PA pressure. Echo report from 08/16/17 reviewed and showed an EF of 65-70% along with mild AR and moderate Bill.   Had stress test done 09/04/17 which showed: LVEF= 58 % FINDINGS: Regional wall motion:reveals normal myocardial thickening and wall  motion. The overall quality of the study is good. Artifacts noted: no Left ventricular cavity: Mild left ventricular enlargement Perfusion Analysis:SPECT images demonstrate homogeneous tracer  distribution throughout the myocardium.  Has not been admitted or been in the ED in the last 6 months.   He presents today for a follow-up visit with a chief complaint of minimal shortness of breath upon moderate exertion. He describes this as chronic in nature having been present for several years. He has associated fatigue, intermittent dizziness & pedal edema along with this. He denies any difficulty sleeping, abdominal distention, palpitations, chest pain, cough or weight gain.   Wearing compression socks but has continued swelling in his lower legs. Sees nephrology March 2022.   Past Medical History:  Diagnosis Date  . BPH (benign prostatic hyperplasia)   . CHF (congestive heart failure) ([place])   . Diabetes mellitus without complication ([place])   . Hyperlipidemia   . Hypertension    Past Surgical History:  Procedure Laterality Date  . BACK SURGERY    . BIOPSY PROSTATE  08/2018  . CARDIAC SURGERY    . STOMACH SURGERY     Family History  Problem Relation Age of Onset  . Heart disease Mother   . Diabetes Father   . Prostate cancer Neg Hx   . Kidney cancer Neg Hx   . Bladder Cancer Neg Hx    Social History   Tobacco  Use  . Smoking status: Never Smoker  . Smokeless tobacco: Never Used  Substance Use Topics  . Alcohol use: No   Allergies  Allergen Reactions  . Heparin     REACTION: Unspecified  . Repaglinide Other (See Comments)    Increased appetite Increased appetite Increased appetite    Prior to Admission medications   Medication Sig Start Date End Date Taking? Authorizing Provider  amLODipine (NORVASC) 5 MG tablet Take 5 mg by mouth daily.    Yes [provider]  aspirin 325 MG tablet Take 325 mg by mouth daily.   Yes [provider]  atorvastatin (LIPITOR) 20 MG tablet Take 20 mg by mouth daily.   Yes [provider]  carvedilol (COREG) 12.5 MG tablet Take 12.5 mg by mouth 2 (two) times daily with a meal.   Yes [provider]  Cholecalciferol (VITAMIN D3) 10 MCG (400 UNIT) tablet Take 400 Units by mouth daily.   Yes [provider]  CINNAMON PO Take 1,000 tablets by mouth daily at 6 (six) AM.   Yes [provider]  finasteride (PROSCAR) 5 MG tablet Take 1 tablet (5 mg total) by mouth daily. 06/24/19  Yes Stoioff, Ronda Fairly, MD  Garlic 1194 MG CAPS Take 1,000 mg by mouth daily at 6 (six) AM.   Yes [provider]  Ginger, Zingiber officinalis, (GINGER ROOT) 550 MG CAPS Take 1 capsule by mouth daily at 6 (six) AM.  Yes [provider]  glimepiride (AMARYL) 1 MG tablet Take 2 mg by mouth daily with breakfast.    Yes [provider]  lisinopril (PRINIVIL,ZESTRIL) 20 MG tablet Take 20 mg by mouth daily.   Yes [provider]  magnesium oxide (MAG-OX) 400 MG tablet Take 500 mg by mouth daily.   Yes [provider]  Misc Natural Products (APPLE CIDER VINEGAR DIET PO) Take 450 mg by mouth daily.   Yes [provider]  Multiple Vitamin (MULTIVITAMIN) capsule Take 1 capsule by mouth daily.   Yes [provider]  omeprazole (PRILOSEC) 20 MG capsule Take 20 mg by mouth daily.   Yes  [provider]  Potassium 99 MG TABS Take 99 mg by mouth daily as needed.   Yes [provider]  Semaglutide 14 MG TABS Take by mouth daily. Or placebo   Yes [provider]  tamsulosin (FLOMAX) 0.4 MG CAPS capsule Take 1 capsule (0.4 mg total) by mouth 2 (two) times daily. 07/02/19  Yes Stoioff, Ronda Fairly, MD  torsemide (DEMADEX) 20 MG tablet Take 20 mg by mouth daily.   Yes [provider]  Turmeric 500 MG CAPS Take 500 mg by mouth daily.   Yes [provider]  vitamin E 400 UNIT capsule Take 400 Units by mouth daily.   Yes [provider]  Vitamins C E (VITAMIN C & E COMBINATION) 500-400 MG-UNIT CAPS Take 1 capsule by mouth daily.   Yes [provider]    Review of Systems  Constitutional: Positive for fatigue (at times). Negative for appetite change.  HENT: Negative for congestion, rhinorrhea and sore throat.   Eyes: Negative.   Respiratory: Positive for shortness of breath ("very little"). Negative for cough and chest tightness.   Cardiovascular: Positive for leg swelling. Negative for chest pain and palpitations.  Gastrointestinal: Negative for abdominal distention and abdominal pain.  Endocrine: Negative.   Genitourinary: Negative for difficulty urinating and hematuria.  Musculoskeletal: Negative for back pain and neck pain.  Skin: Negative.   Allergic/Immunologic: Negative.   Neurological: Positive for dizziness (intermittent). Negative for light-headedness.  Hematological: Negative for adenopathy. Does not bruise/bleed easily.  Psychiatric/Behavioral: Negative for dysphoric mood and sleep disturbance. The patient is not nervous/anxious.    Vitals:   10/19/20 0947  BP: 137/74  Pulse: 82  Resp: 18  SpO2: 100%  Weight: 183 lb 6 oz (83.2 kg)  Height: 5\' 6"  (1.676 m)   Wt Readings from Last 3 Encounters:  10/19/20 183 lb 6 oz (83.2 kg)  04/22/20 183 lb 6 oz (83.2 kg)  10/14/19 191 lb (86.6 kg)   Lab Results   Component Value Date   CREATININE 1.76 (H) 09/06/2017   CREATININE 1.87 (H) 09/05/2017   CREATININE 2.01 (H) 08/18/2017   Physical Exam Vitals and nursing note reviewed.  Constitutional:      Appearance: He is well-developed.  HENT:     Head: Normocephalic and atraumatic.  Neck:     Vascular: No JVD.  Cardiovascular:     Rate and Rhythm: Normal rate and regular rhythm.  Pulmonary:     Effort: Pulmonary effort is normal. No respiratory distress.     Breath sounds: No wheezing or rales.  Abdominal:     General: There is no distension.     Palpations: Abdomen is soft.     Tenderness: There is no abdominal tenderness.  Musculoskeletal:        General: No tenderness.  Cervical back: Normal range of motion and neck supple.     Right lower leg: Edema (2+ pitting) present.     Left lower leg: Edema (1+ pitting) present.  Skin:    General: Skin is warm and dry.  Neurological:     Mental Status: He is alert and oriented to person, place, and time.  Psychiatric:        Behavior: Behavior normal.        Thought Content: Thought content normal.    Assessment & Plan:  1: Chronic heart failure with preserved ejection fraction- - NYHA class II - euvolemic today - weighing daily and he was reminded to call for an overnight weight gain of >2 pounds or a weekly weight gain of >5 pounds - weight stable from last visit 6 months ago - not adding salt and is using Mrs Deliah Boston seasoning instead. Reminded to closely follow a 2000mg  sodium diet - BNP from 09/05/17 was 274.0 - saw cardiology Clayborn Bigness) 09/15/19 - reports receiving all 3 COVID vaccines - reports receiving flu vaccine for this season  2: HTN- - BP looks good today - BMP from 07/18/20 reviewed and showed sodium 138, potassium 4.2, creatinine 1.9 and GFR 34 - saw PCP Ouida Sills) 11/821  3: Diabetes-  - A1c from 06/28/20 was 8.8% - saw nephrology ([place]) 05/10/20  4: Lymphedema- - stage 2 - hasn't been wearing his  compression socks consistently; encouraged to try wearing them daily but to remove them at bedtime - has been elevating his legs more often with a reduction in his edema - does do some walking but edema persists   Medication bottles were reviewed.  Due to HF stability will not make a return appointment at this time. Advised patient to follow closely with his current providers but that he could call to make another appointment at any time and he was comfortable with this plan.

## 2020-10-19 NOTE — Patient Instructions (Addendum)
Continue weighing daily and call for an overnight weight gain of > 2 pounds or a weekly weight gain of >5 pounds.   Call us in the future if you'd like to schedule another appointment 

## 2020-11-30 ENCOUNTER — Other Ambulatory Visit: Payer: Self-pay

## 2020-11-30 ENCOUNTER — Ambulatory Visit (INDEPENDENT_AMBULATORY_CARE_PROVIDER_SITE_OTHER): Payer: Medicare Other | Admitting: Vascular Surgery

## 2020-11-30 VITALS — BP 135/69 | HR 69 | Ht 68.0 in | Wt 185.0 lb

## 2020-11-30 DIAGNOSIS — M7989 Other specified soft tissue disorders: Secondary | ICD-10-CM | POA: Insufficient documentation

## 2020-11-30 DIAGNOSIS — N1832 Chronic kidney disease, stage 3b: Secondary | ICD-10-CM | POA: Diagnosis not present

## 2020-11-30 DIAGNOSIS — I10 Essential (primary) hypertension: Secondary | ICD-10-CM | POA: Diagnosis not present

## 2020-11-30 DIAGNOSIS — L97209 Non-pressure chronic ulcer of unspecified calf with unspecified severity: Secondary | ICD-10-CM | POA: Insufficient documentation

## 2020-11-30 DIAGNOSIS — E1122 Type 2 diabetes mellitus with diabetic chronic kidney disease: Secondary | ICD-10-CM

## 2020-11-30 DIAGNOSIS — L97201 Non-pressure chronic ulcer of unspecified calf limited to breakdown of skin: Secondary | ICD-10-CM | POA: Diagnosis not present

## 2020-11-30 DIAGNOSIS — I890 Lymphedema, not elsewhere classified: Secondary | ICD-10-CM

## 2020-11-30 NOTE — Patient Instructions (Signed)
Lymphedema  Lymphedema is swelling that is caused by the abnormal collection of lymph in the tissues under the skin. Lymph is excess fluid from the tissues in your body that is removed through the lymphatic system. This system is part of your body's defense system (immune system) and includes lymph nodes and lymph vessels. The lymph vessels collect and carry the excess fluid, fats, proteins, and waste from the tissues of the body to the bloodstream. This system also works to clean and remove bacteria and waste products from the body. Lymphedema occurs when the lymphatic system is blocked. When the lymph vessels or lymph nodes are blocked or damaged, lymph does not drain properly. This causes an abnormal buildup of lymph, which leads to swelling in the affected area. This may include the trunk area, or an arm or leg. Lymphedema cannot be cured by medicines, but various methods can be used to help reduce the swelling. What are the causes? The cause of this condition depends on the type of lymphedema that you have.  Primary lymphedema is caused by the absence of lymph vessels or having abnormal lymph vessels at birth.  Secondary lymphedema occurs when lymph vessels are blocked or damaged. Secondary lymphedema is more common. Common causes of lymph vessel blockage include: ? Skin infection, such as cellulitis. ? Infection by parasites (filariasis). ? Injury. ? Radiation therapy. ? Cancer. ? Formation of scar tissue. ? Surgery. What are the signs or symptoms? Symptoms of this condition include:  Swelling of the arm or leg.  A heavy or tight feeling in the arm or leg.  Swelling of the feet, toes, or fingers. Shoes or rings may fit more tightly than before.  Redness of the skin over the affected area.  Limited movement of the affected limb.  Sensitivity to touch or discomfort in the affected limb. How is this diagnosed? This condition may be diagnosed based on:  Your symptoms and medical  history.  A physical exam.  Bioimpedance spectroscopy. In this test, painless electrical currents are used to measure fluid levels in your body.  Imaging tests, such as: ? MRI. ? CT scan. ? Duplex ultrasound. This test uses sound waves to produce images of the vessels and the blood flow on a screen. ? Lymphoscintigraphy. In this test, a low dose of a radioactive substance is injected to trace the flow of lymph through your lymph vessels. ? Lymphangiography. In this test, a contrast dye is injected into the lymph vessel to help show blockages. How is this treated? If an underlying condition is causing the lymphedema, that condition will be treated. For example, antibiotic medicines may be used to treat an infection. Treatment for this condition will depend on the cause of your lymphedema. Treatment may include:  Complete decongestive therapy (CDT). This is done by a certified lymphedema therapist to reduce fluid congestion. This therapy includes: ? Skin care. ? Compression wrapping of the affected area. ? Manual lymph drainage. This is a special massage technique that promotes lymph drainage out of a limb. ? Specific exercises. Certain exercises can help fluid move out of the affected limb.  Compression. Various methods may be used to apply pressure to the affected limb to reduce the swelling. They include: ? Wearing compression stockings or sleeves on the affected limb. ? Wrapping the affected limb with special bandages.  Surgery. This is usually done for severe cases only. For example, surgery may be done if you have trouble moving the limb or if the swelling does not  get better with other treatments.   Follow these instructions at home: Self-care  The affected area is more likely to become injured or infected. Take these steps to help prevent infection: ? Keep the affected area clean and dry. ? Use approved creams or lotions to keep the skin moisturized. ? Protect your skin from  cuts:  Use gloves while cooking or gardening.  Do not walk barefoot.  If you shave the affected area, use an Copy.  Do not wear tight clothes, shoes, or jewelry.  Eat a healthy diet that includes a lot of fruits and vegetables. Activity  Do exercises as told by your health care provider.  Do not sit with your legs crossed.  When possible, keep the affected limb raised (elevated) above the level of your heart.  Avoid carrying things with an arm that is affected by lymphedema. General instructions  Wear compression stockings or sleeves as told by your health care provider.  Note any changes in size of the affected limb. You may be instructed to take regular measurements and keep track of them.  Take over-the-counter and prescription medicines only as told by your health care provider.  If you were prescribed an antibiotic medicine, take or apply it as told by your health care provider. Do not stop using the antibiotic even if you start to feel better or if your condition improves.  Do not use heating pads or ice packs on the affected area.  Avoid having blood draws, IV insertions, or blood pressure checks on the affected limb.  Keep all follow-up visits. This is important. Contact a health care provider if you:  Continue to have swelling in your limb.  Have fluid leaking from the skin of your swollen limb.  Have a cut that does not heal.  Have redness or pain in the affected area.  Develop purplish spots, rash, blisters, or sores (lesions) on your affected limb. Get help right away if you:  Have new swelling in your limb that starts suddenly.  Have shortness of breath or chest pain.  Have a fever or chills. These symptoms may represent a serious problem that is an emergency. Do not wait to see if the symptoms will go away. Get medical help right away. Call your local emergency services (911 in the [place] Do not drive yourself to the  hospital. Summary  Lymphedema is swelling that is caused by the abnormal collection of lymph in the tissues under the skin.  Lymph is fluid from the tissues in your body that is removed through the lymphatic system. This system collects and carries excess fluid, fats, proteins, and wastes from the tissues of the body to the bloodstream.  Lymphedema causes swelling, pain, and redness in the affected area. This may include the trunk area, or an arm or leg.  Treatment for this condition may depend on the cause of your lymphedema. Treatment may include treating the underlying cause, complete decongestive therapy (CDT), compression methods, or surgery. This information is not intended to replace advice given to you by your health care provider. Make sure you discuss any questions you have with your health care provider. Document Revised: 06/09/2020 Document Reviewed: 06/09/2020 Elsevier Patient Education  2021 [employer].

## 2020-11-30 NOTE — Assessment & Plan Note (Signed)
Worsens lower extremity swelling. 

## 2020-11-30 NOTE — Assessment & Plan Note (Signed)
blood glucose control important in reducing the progression of atherosclerotic disease. Also, involved in wound healing. On appropriate medications.  

## 2020-11-30 NOTE — Assessment & Plan Note (Signed)
blood pressure control important in reducing the progression of atherosclerotic disease. On appropriate oral medications.  

## 2020-11-30 NOTE — Assessment & Plan Note (Signed)
The patient has bilateral lower extremity superficial ulcerations associated with prominent swelling.  Were going to wrap him in Unna boots today.  3 layer Unna boots were placed and will be changed weekly.  We will reassess this in 3 to 4 weeks.

## 2020-11-30 NOTE — Assessment & Plan Note (Signed)
Severe and refractory to compression and elevation. The patient has almost certainly developed severe lymphedema from chronic scarring limb static channels with chronic swelling.  This would be stage III lymphedema with nonhealing ulcerations and fixed skin changes with refractory swelling.  Going to wrap him in Unna boots try to get the swelling and the ulcerations under control.  Eventually, a lymphedema pump will be a helpful adjuvant therapy to his regimen as well as compression and elevation.  We can pursue this once the wounds are healed.  Were also going to assess his venous system with a reflux study in the near future at his convenience.

## 2020-11-30 NOTE — Assessment & Plan Note (Signed)
The patient has almost certainly developed severe lymphedema from chronic scarring limb static channels with chronic swelling.  This would be stage III lymphedema with nonhealing ulcerations and fixed skin changes with refractory swelling.  Going to wrap him in Unna boots try to get the swelling and the ulcerations under control.  Eventually, a lymphedema pump will be a helpful adjuvant therapy to his regimen as well as compression and elevation.  We can pursue this once the wounds are healed.  Were also going to assess his venous system with a reflux study in the near future at his convenience.

## 2020-11-30 NOTE — Progress Notes (Signed)
Patient ID: Bill Peterson, male   DOB: [birth year], 86 y.o.   MRN: [id number]  Chief Complaint  Patient presents with  . New Patient (Initial Visit)    White. edema    HPI Bill Peterson is a 86 y.o. male.  I am asked to see the patient by D. White, NP for evaluation of leg swelling.  The patient has had some degree of swelling for many years, but has gotten much worse over the past few months.  He has a litany of medical issues as listed below with heart issues being prominent.  His cardiologist saw him recently and his swelling was quite severe and he had skin breakdown with weeping.  He has some superficial ulcerations on both lower extremities that have been present for a few weeks.  He has significant stasis dermatitis changes to both legs.  He has been trying to elevate his legs.  He has to wear white socks because dark socks pull off the skin.  No fevers or chills.  Both legs are affected although the worst ulceration at this point is on the left leg.     Past Medical History:  Diagnosis Date  . BPH (benign prostatic hyperplasia)   . CHF (congestive heart failure) ([place])   . Diabetes mellitus without complication ([place])   . Hyperlipidemia   . Hypertension     Past Surgical History:  Procedure Laterality Date  . BACK SURGERY    . BIOPSY PROSTATE  08/2018  . CARDIAC SURGERY    . STOMACH SURGERY       Family History  Problem Relation Age of Onset  . Heart disease Mother   . Diabetes Father   . Prostate cancer Neg Hx   . Kidney cancer Neg Hx   . Bladder Cancer Neg Hx      Social History   Tobacco Use  . Smoking status: Never Smoker  . Smokeless tobacco: Never Used  Vaping Use  . Vaping Use: Never used  Substance Use Topics  . Alcohol use: No  . Drug use: No    Allergies  Allergen Reactions  . Heparin     REACTION: Unspecified  . Repaglinide Other (See Comments)    Increased appetite Increased appetite Increased appetite     Current Outpatient  Medications  Medication Sig Dispense Refill  . amLODipine (NORVASC) 5 MG tablet Take 5 mg by mouth daily.     Marland Kitchen aspirin 325 MG tablet Take 325 mg by mouth daily.    Marland Kitchen atorvastatin (LIPITOR) 20 MG tablet Take 20 mg by mouth daily.    . carvedilol (COREG) 12.5 MG tablet Take 12.5 mg by mouth 2 (two) times daily with a meal.    . Cholecalciferol (VITAMIN D3) 10 MCG (400 UNIT) tablet Take 400 Units by mouth daily.    Marland Kitchen CINNAMON PO Take 1,000 tablets by mouth daily at 6 (six) AM.    . empagliflozin (JARDIANCE) 10 MG TABS tablet Take 1 tablet by mouth daily with breakfast.    . finasteride (PROSCAR) 5 MG tablet Take 1 tablet (5 mg total) by mouth daily. 30 tablet 11  . Garlic 0272 MG CAPS Take 1,000 mg by mouth daily at 6 (six) AM.    . Ginger, Zingiber officinalis, (GINGER ROOT) 550 MG CAPS Take 1 capsule by mouth daily at 6 (six) AM.    . glimepiride (AMARYL) 1 MG tablet Take 2 mg by mouth daily with breakfast.     .  lisinopril (PRINIVIL,ZESTRIL) 20 MG tablet Take 20 mg by mouth daily.    . magnesium oxide (MAG-OX) 400 MG tablet Take 500 mg by mouth daily.    . Misc Natural Products (APPLE CIDER VINEGAR DIET PO) Take 450 mg by mouth daily.    . Multiple Vitamin (MULTIVITAMIN) capsule Take 1 capsule by mouth daily.    Marland Kitchen omeprazole (PRILOSEC) 20 MG capsule Take 20 mg by mouth daily.    . Potassium 99 MG TABS Take 99 mg by mouth daily as needed.    . Potassium Gluconate 2.5 MEQ TABS Take 1 tablet by mouth daily as needed.    . Semaglutide 14 MG TABS Take by mouth daily. Or placebo    . tamsulosin (FLOMAX) 0.4 MG CAPS capsule Take 1 capsule (0.4 mg total) by mouth 2 (two) times daily. 180 capsule 3  . torsemide (DEMADEX) 20 MG tablet Take 20 mg by mouth daily.    . Turmeric 500 MG CAPS Take 500 mg by mouth daily.    . vitamin E 400 UNIT capsule Take 400 Units by mouth daily.    . Vitamins C E (VITAMIN C & E COMBINATION) 500-400 MG-UNIT CAPS Take 1 capsule by mouth daily.     No current  facility-administered medications for this visit.      REVIEW OF SYSTEMS (Negative unless checked)  Constitutional: [] Weight loss  [] Fever  [] Chills Cardiac: [] Chest pain   [] Chest pressure   [x] Palpitations   [] Shortness of breath when laying flat   [] Shortness of breath at rest   [x] Shortness of breath with exertion. Vascular:  [x] Pain in legs with walking   [] Pain in legs at rest   [] Pain in legs when laying flat   [] Claudication   [] Pain in feet when walking  [] Pain in feet at rest  [] Pain in feet when laying flat   [] History of DVT   [] Phlebitis   [x] Swelling in legs   [] Varicose veins   [x] Non-healing ulcers Pulmonary:   [] Uses home oxygen   [] Productive cough   [] Hemoptysis   [] Wheeze  [] COPD   [] Asthma Neurologic:  [] Dizziness  [] Blackouts   [] Seizures   [] History of stroke   [] History of TIA  [] Aphasia   [] Temporary blindness   [] Dysphagia   [] Weakness or numbness in arms   [] Weakness or numbness in legs Musculoskeletal:  [x] Arthritis   [] Joint swelling   [x] Joint pain   [] Low back pain Hematologic:  [] Easy bruising  [] Easy bleeding   [] Hypercoagulable state   [] Anemic  [] Hepatitis Gastrointestinal:  [] Blood in stool   [] Vomiting blood  [] Gastroesophageal reflux/heartburn   [] Abdominal pain Genitourinary:  [] Chronic kidney disease   [] Difficult urination  [] Frequent urination  [] Burning with urination   [] Hematuria Skin:  [] Rashes   [x] Ulcers   [x] Wounds Psychological:  [] History of anxiety   []  History of major depression.    Physical Exam BP 135/69   Pulse 69   Ht 5\' 8"  (1.727 m)   Wt 185 lb (83.9 kg)   BMI 28.13 kg/m  Gen:  WD/WN, NAD.  Appears younger than stated age Head: [place]/AT, No temporalis wasting.  Ear/Nose/Throat: Hearing grossly intact, nares w/o erythema or drainage, oropharynx w/o Erythema/Exudate Eyes: Conjunctiva clear, sclera non-icteric  Neck: trachea midline.  No JVD.  Pulmonary:  Good air movement, respirations not labored, no use of accessory muscles   Cardiac: Somewhat irregular Vascular:  Vessel Right Left  Radial Palpable Palpable  DP NP NP  PT NP NP   Gastrointestinal:. No masses, surgical incisions, or scars. Musculoskeletal: M/S 5/5 throughout.  Extremities without ischemic changes.  No deformity or atrophy.  Superficial ulcerations are present on both lower extremities more so on the left than the right.  Marked stasis dermatitis changes are present bilaterally.  3+ bilateral lower extremity edema. Neurologic: Sensation grossly intact in extremities.  Symmetrical.  Speech is fluent. Motor exam as listed above. Psychiatric: Judgment intact, Mood & affect appropriate for pt's clinical situation. Dermatologic: Superficial ulcerations are present on both lower extremities as above.  No erythema.  Serous drainage is present.    Radiology No results found.  Labs No results found for this or any previous visit (from the past 2160 hour(s)).  Assessment/Plan:  Lower limb ulcer, calf ([place]) The patient has bilateral lower extremity superficial ulcerations associated with prominent swelling.  Were going to wrap him in Unna boots today.  3 layer Unna boots were placed and will be changed weekly.  We will reassess this in 3 to 4 weeks.  HTN (hypertension) blood pressure control important in reducing the progression of atherosclerotic disease. On appropriate oral medications.   Stage 3b chronic kidney disease ([place]) Worsens lower extremity swelling.  Diabetes ([place]) blood glucose control important in reducing the progression of atherosclerotic disease. Also, involved in wound healing. On appropriate medications.   Lymphedema The patient has almost certainly developed severe lymphedema from chronic scarring limb static channels with chronic swelling.  This would be stage III lymphedema with nonhealing ulcerations and fixed skin changes with refractory swelling.  Going to wrap him in Unna boots try to get  the swelling and the ulcerations under control.  Eventually, a lymphedema pump will be a helpful adjuvant therapy to his regimen as well as compression and elevation.  We can pursue this once the wounds are healed.  Were also going to assess his venous system with a reflux study in the near future at his convenience.  Swelling of limb Severe and refractory to compression and elevation. The patient has almost certainly developed severe lymphedema from chronic scarring limb static channels with chronic swelling.  This would be stage III lymphedema with nonhealing ulcerations and fixed skin changes with refractory swelling.  Going to wrap him in Unna boots try to get the swelling and the ulcerations under control.  Eventually, a lymphedema pump will be a helpful adjuvant therapy to his regimen as well as compression and elevation.  We can pursue this once the wounds are healed.  Were also going to assess his venous system with a reflux study in the near future at his convenience.      Leotis Pain 11/30/2020, 12:30 PM   This note was created with Dragon medical transcription system.  Any errors from dictation are unintentional.

## 2020-12-07 ENCOUNTER — Other Ambulatory Visit: Payer: Self-pay

## 2020-12-07 ENCOUNTER — Ambulatory Visit (INDEPENDENT_AMBULATORY_CARE_PROVIDER_SITE_OTHER): Payer: Medicare Other | Admitting: Vascular Surgery

## 2020-12-07 ENCOUNTER — Encounter (INDEPENDENT_AMBULATORY_CARE_PROVIDER_SITE_OTHER): Payer: Self-pay

## 2020-12-07 VITALS — BP 152/75 | HR 70 | Resp 16 | Wt 182.8 lb

## 2020-12-07 DIAGNOSIS — M7989 Other specified soft tissue disorders: Secondary | ICD-10-CM

## 2020-12-07 DIAGNOSIS — R600 Localized edema: Secondary | ICD-10-CM

## 2020-12-07 DIAGNOSIS — L97201 Non-pressure chronic ulcer of unspecified calf limited to breakdown of skin: Secondary | ICD-10-CM

## 2020-12-07 NOTE — Progress Notes (Signed)
History of Present Illness  There is no documented history at this time  Assessments & Plan   There are no diagnoses linked to this encounter.    Additional instructions  Subjective:  Patient presents with venous ulcer of the Bilateral lower extremity.    Procedure:  3 layer unna wrap was placed Bilateral lower extremity.   Plan:   Follow up in one week.  

## 2020-12-14 ENCOUNTER — Ambulatory Visit (INDEPENDENT_AMBULATORY_CARE_PROVIDER_SITE_OTHER): Payer: Medicare Other | Admitting: Nurse Practitioner

## 2020-12-14 ENCOUNTER — Other Ambulatory Visit: Payer: Self-pay

## 2020-12-14 ENCOUNTER — Encounter (INDEPENDENT_AMBULATORY_CARE_PROVIDER_SITE_OTHER): Payer: Self-pay

## 2020-12-14 VITALS — BP 137/61 | HR 64 | Resp 16 | Wt 181.2 lb

## 2020-12-14 DIAGNOSIS — L97201 Non-pressure chronic ulcer of unspecified calf limited to breakdown of skin: Secondary | ICD-10-CM

## 2020-12-14 NOTE — Progress Notes (Signed)
History of Present Illness  There is no documented history at this time  Assessments & Plan   There are no diagnoses linked to this encounter.    Additional instructions  Subjective:  Patient presents with venous ulcer of the Bilateral lower extremity.    Procedure:  3 layer unna wrap was placed Bilateral lower extremity.   Plan:   Follow up in one week.  

## 2020-12-22 ENCOUNTER — Other Ambulatory Visit: Payer: Self-pay

## 2020-12-22 ENCOUNTER — Ambulatory Visit (INDEPENDENT_AMBULATORY_CARE_PROVIDER_SITE_OTHER): Payer: Medicare Other

## 2020-12-22 ENCOUNTER — Ambulatory Visit (INDEPENDENT_AMBULATORY_CARE_PROVIDER_SITE_OTHER): Payer: Medicare Other | Admitting: Nurse Practitioner

## 2020-12-22 VITALS — BP 151/71 | HR 66 | Ht 66.0 in | Wt 179.0 lb

## 2020-12-22 DIAGNOSIS — M7989 Other specified soft tissue disorders: Secondary | ICD-10-CM

## 2020-12-22 DIAGNOSIS — I10 Essential (primary) hypertension: Secondary | ICD-10-CM | POA: Diagnosis not present

## 2020-12-22 DIAGNOSIS — L97201 Non-pressure chronic ulcer of unspecified calf limited to breakdown of skin: Secondary | ICD-10-CM

## 2020-12-30 ENCOUNTER — Encounter (INDEPENDENT_AMBULATORY_CARE_PROVIDER_SITE_OTHER): Payer: Self-pay | Admitting: Nurse Practitioner

## 2021-01-02 NOTE — Progress Notes (Signed)
Subjective:    Patient ID: Bill Peterson, male    DOB: 1935/05/08, 86 y.o.   MRN: [id number] Chief Complaint  Patient presents with  . Follow-up    4 week  bil unna boot check U/S    Bill Peterson is a 86 y.o. male.  The patient returns for evaluation of his lower extremity swelling and ulcerations.  The swelling has improved however he still has some areas of skin breakdown.  He tries to elevate his leg is much as possible.  He notes that the weeping has been much decreased.  He denies any fever or chills.   Today noninvasive studies show no evidence of DVT or superficial thrombophlebitis.  No evidence of deep venous insufficiency or superficial venous reflux seen bilaterally.   Review of Systems     Objective:   Physical Exam  BP (!) 151/71   Pulse 66   Ht 5\' 6"  (1.676 m)   Wt 179 lb (81.2 kg)   BMI 28.89 kg/m   Past Medical History:  Diagnosis Date  . BPH (benign prostatic hyperplasia)   . CHF (congestive heart failure) ([place])   . Diabetes mellitus without complication ([place])   . Hyperlipidemia   . Hypertension     Social History   Socioeconomic History  . Marital status: Married    Spouse name: Not on file  . Number of children: Not on file  . Years of education: Not on file  . Highest education level: Not on file  Occupational History  . Occupation: retired  Tobacco Use  . Smoking status: Never Smoker  . Smokeless tobacco: Never Used  Vaping Use  . Vaping Use: Never used  Substance and Sexual Activity  . Alcohol use: No  . Drug use: No  . Sexual activity: Yes  Other Topics Concern  . Not on file  Social History Narrative   Lives at home with his wife. Active at baseline   Social Determinants of Health   Financial Resource Strain: Not on file  Food Insecurity: Not on file  Transportation Needs: Not on file  Physical Activity: Not on file  Stress: Not on file  Social Connections: Not on file  Intimate Partner Violence: Not on file    Past  Surgical History:  Procedure Laterality Date  . BACK SURGERY    . BIOPSY PROSTATE  08/2018  . CARDIAC SURGERY    . STOMACH SURGERY      Family History  Problem Relation Age of Onset  . Heart disease Mother   . Diabetes Father   . Prostate cancer Neg Hx   . Kidney cancer Neg Hx   . Bladder Cancer Neg Hx     Allergies  Allergen Reactions  . Heparin     REACTION: Unspecified  . Repaglinide Other (See Comments)    Increased appetite Increased appetite Increased appetite     CBC Latest Ref Rng & Units 09/06/2017 09/05/2017 08/18/2017  WBC 3.8 - 10.6 K/uL 4.8 10.1 5.5  Hemoglobin 13.0 - 18.0 g/dL 10.5(L) 12.3(L) 10.7(L)  Hematocrit 40.0 - 52.0 % 31.2(L) 36.9(L) 31.4(L)  Platelets 150 - 440 K/uL 193 231 181      CMP     Component Value Date/Time   NA 141 09/06/2017 0645   K 3.5 09/06/2017 0645   CL 104 09/06/2017 0645   CO2 28 09/06/2017 0645   GLUCOSE 137 (H) 09/06/2017 0645   BUN 23 (H) 09/06/2017 0645   CREATININE 1.76 (H) 09/06/2017  3244   CALCIUM 8.6 (L) 09/06/2017 0645   PROT 5.7 (L) 11/04/2007 0400   ALBUMIN 3.0 (L) 08/18/2017 0500   AST 30 11/04/2007 0400   ALT 23 11/04/2007 0400   ALKPHOS 44 11/04/2007 0400   BILITOT 0.3 11/04/2007 0400   GFRNONAA 35 (L) 09/06/2017 0645   GFRAA 40 (L) 09/06/2017 0645     No results found.     Assessment & Plan:   1. Skin ulcer of calf, limited to breakdown of skin, unspecified laterality ([place]) No surgery or intervention at this point in time.    I have had a long discussion with the patient regarding venous insufficiency and why it  causes symptoms, specifically venous ulceration . I have discussed with the patient the chronic skin changes that accompany venous insufficiency and the long term sequela such as infection and recurring  ulceration.  Patient will be placed in [employer] which will be changed weekly drainage permitting.  In addition, behavioral modification including several periods of elevation of the  lower extremities during the day will be continued. Achieving a position with the ankles at heart level was stressed to the patient  The patient will continue to have a rash done by home health and will return in 4 weeks.  2. Primary hypertension Continue antihypertensive medications as already ordered, these medications have been reviewed and there are no changes at this time.    Current Outpatient Medications on File Prior to Visit  Medication Sig Dispense Refill  . amLODipine (NORVASC) 5 MG tablet Take 5 mg by mouth daily.     Marland Kitchen aspirin 325 MG tablet Take 325 mg by mouth daily.    Marland Kitchen atorvastatin (LIPITOR) 20 MG tablet Take 20 mg by mouth daily.    . carvedilol (COREG) 12.5 MG tablet Take 12.5 mg by mouth 2 (two) times daily with a meal.    . Cholecalciferol (VITAMIN D3) 10 MCG (400 UNIT) tablet Take 400 Units by mouth daily.    Marland Kitchen CINNAMON PO Take 1,000 tablets by mouth daily at 6 (six) AM.    . empagliflozin (JARDIANCE) 10 MG TABS tablet Take 1 tablet by mouth daily with breakfast.    . finasteride (PROSCAR) 5 MG tablet Take 1 tablet (5 mg total) by mouth daily. 30 tablet 11  . Garlic 0102 MG CAPS Take 1,000 mg by mouth daily at 6 (six) AM.    . Ginger, Zingiber officinalis, (GINGER ROOT) 550 MG CAPS Take 1 capsule by mouth daily at 6 (six) AM.    . glimepiride (AMARYL) 1 MG tablet Take 2 mg by mouth daily with breakfast.     . lisinopril (PRINIVIL,ZESTRIL) 20 MG tablet Take 20 mg by mouth daily.    . magnesium oxide (MAG-OX) 400 MG tablet Take 500 mg by mouth daily.    . Misc Natural Products (APPLE CIDER VINEGAR DIET PO) Take 450 mg by mouth daily.    . Multiple Vitamin (MULTIVITAMIN) capsule Take 1 capsule by mouth daily.    Marland Kitchen omeprazole (PRILOSEC) 20 MG capsule Take 20 mg by mouth daily.    . Potassium 99 MG TABS Take 99 mg by mouth daily as needed.    . Potassium Gluconate 2.5 MEQ TABS Take 1 tablet by mouth daily as needed.    . Semaglutide 14 MG TABS Take by mouth daily. Or  placebo    . tamsulosin (FLOMAX) 0.4 MG CAPS capsule Take 1 capsule (0.4 mg total) by mouth 2 (two) times daily. 180 capsule  3  . torsemide (DEMADEX) 20 MG tablet Take 20 mg by mouth daily.    . Turmeric 500 MG CAPS Take 500 mg by mouth daily.    . vitamin E 400 UNIT capsule Take 400 Units by mouth daily.    . Vitamins C E (VITAMIN C & E COMBINATION) 500-400 MG-UNIT CAPS Take 1 capsule by mouth daily.     No current facility-administered medications on file prior to visit.    There are no Patient Instructions on file for this visit. No follow-ups on file.   Kris Hartmann, NP

## 2021-01-19 ENCOUNTER — Ambulatory Visit (INDEPENDENT_AMBULATORY_CARE_PROVIDER_SITE_OTHER): Payer: Medicare Other | Admitting: Nurse Practitioner

## 2021-01-19 ENCOUNTER — Encounter (INDEPENDENT_AMBULATORY_CARE_PROVIDER_SITE_OTHER): Payer: Self-pay | Admitting: Nurse Practitioner

## 2021-01-19 ENCOUNTER — Other Ambulatory Visit: Payer: Self-pay

## 2021-01-19 VITALS — BP 132/65 | HR 64 | Resp 16 | Ht 68.0 in | Wt 180.0 lb

## 2021-01-19 DIAGNOSIS — L97201 Non-pressure chronic ulcer of unspecified calf limited to breakdown of skin: Secondary | ICD-10-CM

## 2021-01-19 DIAGNOSIS — I890 Lymphedema, not elsewhere classified: Secondary | ICD-10-CM | POA: Diagnosis not present

## 2021-01-19 DIAGNOSIS — I10 Essential (primary) hypertension: Secondary | ICD-10-CM

## 2021-01-27 ENCOUNTER — Telehealth (INDEPENDENT_AMBULATORY_CARE_PROVIDER_SITE_OTHER): Payer: Self-pay | Admitting: Vascular Surgery

## 2021-01-27 ENCOUNTER — Encounter (INDEPENDENT_AMBULATORY_CARE_PROVIDER_SITE_OTHER): Payer: Self-pay | Admitting: Nurse Practitioner

## 2021-01-27 ENCOUNTER — Encounter (INDEPENDENT_AMBULATORY_CARE_PROVIDER_SITE_OTHER): Payer: Medicare Other

## 2021-01-27 NOTE — Telephone Encounter (Signed)
I called the pt and  made him aware that he was  advised  correctly also he doesn't need to put any kind of creams on open  wounds just bandage the leg up as best he can until his appointment next week.

## 2021-01-30 ENCOUNTER — Encounter (INDEPENDENT_AMBULATORY_CARE_PROVIDER_SITE_OTHER): Payer: Self-pay | Admitting: Nurse Practitioner

## 2021-01-30 NOTE — Progress Notes (Signed)
Subjective:    Patient ID: Bill Peterson, male    DOB: [birth year], 85 y.o.   MRN: [id number] Chief Complaint  Patient presents with  . Follow-up    4 weeks no studies     Patient is seen for follow up evaluation of leg pain and swelling associated with venous ulceration. The patient was recently seen here and started on Unna boot therapy.  The swelling abruptly became much worse bilaterally and is associated with pain and discoloration. The pain and swelling worsens with prolonged dependency and improves with elevation.  The patient notes that in the morning the legs are better but the leg symptoms worsened throughout the course of the day. The patient has also noted a progressive worsening of the discoloration in the ankle and shin area.   The patient notes that an ulcer has developed acutely without specific trauma and since it occurred it has been very slow to heal.  There is a moderate amount of drainage associated with the open area.  The wound is also very painful.    The patient states that they have been elevating as much as possible. The patient denies any recent changes in medications.  The patient denies a history of DVT or PE. There is no prior history of phlebitis. There is no history of primary lymphedema.  No SOB or increased cough.  No sputum production.  No recent episodes of CHF exacerbation.   Review of Systems  Cardiovascular: Positive for leg swelling.  Skin: Positive for wound.  All other systems reviewed and are negative.      Objective:   Physical Exam Vitals reviewed.  HENT:     Head: Normocephalic.  Cardiovascular:     Rate and Rhythm: Normal rate.     Pulses: Normal pulses.  Pulmonary:     Effort: Pulmonary effort is normal.  Neurological:     Mental Status: He is alert and oriented to person, place, and time.     Motor: Weakness present.     Gait: Gait abnormal.  Psychiatric:        Mood and Affect: Mood normal.        Behavior: Behavior  normal.        Thought Content: Thought content normal.        Judgment: Judgment normal.     BP 132/65 (BP Location: Right Arm)   Pulse 64   Resp 16   Ht 5\' 8"  (1.727 m)   Wt 180 lb (81.6 kg)   BMI 27.37 kg/m   Past Medical History:  Diagnosis Date  . BPH (benign prostatic hyperplasia)   . CHF (congestive heart failure) ([place])   . Diabetes mellitus without complication ([place])   . Hyperlipidemia   . Hypertension     Social History   Socioeconomic History  . Marital status: Married    Spouse name: Not on file  . Number of children: Not on file  . Years of education: Not on file  . Highest education level: Not on file  Occupational History  . Occupation: retired  Tobacco Use  . Smoking status: Never Smoker  . Smokeless tobacco: Never Used  Vaping Use  . Vaping Use: Never used  Substance and Sexual Activity  . Alcohol use: No  . Drug use: No  . Sexual activity: Yes  Other Topics Concern  . Not on file  Social History Narrative   Lives at home with his wife. Active at baseline   Social Determinants of  Health   Financial Resource Strain: Not on file  Food Insecurity: Not on file  Transportation Needs: Not on file  Physical Activity: Not on file  Stress: Not on file  Social Connections: Not on file  Intimate Partner Violence: Not on file    Past Surgical History:  Procedure Laterality Date  . BACK SURGERY    . BIOPSY PROSTATE  08/2018  . CARDIAC SURGERY    . STOMACH SURGERY      Family History  Problem Relation Age of Onset  . Heart disease Mother   . Diabetes Father   . Prostate cancer Neg Hx   . Kidney cancer Neg Hx   . Bladder Cancer Neg Hx     Allergies  Allergen Reactions  . Heparin     REACTION: Unspecified  . Repaglinide Other (See Comments)    Increased appetite Increased appetite Increased appetite     CBC Latest Ref Rng & Units 09/06/2017 09/05/2017 08/18/2017  WBC 3.8 - 10.6 K/uL 4.8 10.1 5.5  Hemoglobin 13.0 - 18.0 g/dL 10.5(L)  12.3(L) 10.7(L)  Hematocrit 40.0 - 52.0 % 31.2(L) 36.9(L) 31.4(L)  Platelets 150 - 440 K/uL 193 231 181      CMP     Component Value Date/Time   NA 141 09/06/2017 0645   K 3.5 09/06/2017 0645   CL 104 09/06/2017 0645   CO2 28 09/06/2017 0645   GLUCOSE 137 (H) 09/06/2017 0645   BUN 23 (H) 09/06/2017 0645   CREATININE 1.76 (H) 09/06/2017 0645   CALCIUM 8.6 (L) 09/06/2017 0645   PROT 5.7 (L) 11/04/2007 0400   ALBUMIN 3.0 (L) 08/18/2017 0500   AST 30 11/04/2007 0400   ALT 23 11/04/2007 0400   ALKPHOS 44 11/04/2007 0400   BILITOT 0.3 11/04/2007 0400   GFRNONAA 35 (L) 09/06/2017 0645   GFRAA 40 (L) 09/06/2017 0645     No results found.     Assessment & Plan:   1. Skin ulcer of calf, limited to breakdown of skin, unspecified laterality ([place]) No surgery or intervention at this point in time.    I have had a long discussion with the patient regarding venous insufficiency and why it  causes symptoms, specifically venous ulceration . I have discussed with the patient the chronic skin changes that accompany venous insufficiency and the long term sequela such as infection and recurring  ulceration.  Patient will be placed in [employer] which will be changed weekly drainage permitting.  In addition, behavioral modification including several periods of elevation of the lower extremities during the day will be continued. Achieving a position with the ankles at heart level was stressed to the patient  The patient is instructed to begin routine exercise, especially walking on a daily basis  Patient will follow up in 4 weeks for wound evaluation.  The patient can be assessed for graduated compression stockings or wraps as well as a Lymph Pump once the ulcers are healed.   2. Lymphedema Patient will continue to adhere to conservative therapy as outlined above.  3. Primary hypertension Continue antihypertensive medications as already ordered, these medications have been reviewed and  there are no changes at this time.    Current Outpatient Medications on File Prior to Visit  Medication Sig Dispense Refill  . amLODipine (NORVASC) 5 MG tablet Take 5 mg by mouth daily.     Marland Kitchen aspirin 325 MG tablet Take 325 mg by mouth daily.    Marland Kitchen atorvastatin (LIPITOR) 20 MG tablet Take  20 mg by mouth daily.    . carvedilol (COREG) 12.5 MG tablet Take 12.5 mg by mouth 2 (two) times daily with a meal.    . Cholecalciferol (VITAMIN D3) 10 MCG (400 UNIT) tablet Take 400 Units by mouth daily.    Marland Kitchen CINNAMON PO Take 1,000 tablets by mouth daily at 6 (six) AM.    . empagliflozin (JARDIANCE) 10 MG TABS tablet Take 1 tablet by mouth daily with breakfast.    . finasteride (PROSCAR) 5 MG tablet Take 1 tablet (5 mg total) by mouth daily. 30 tablet 11  . Garlic 0300 MG CAPS Take 1,000 mg by mouth daily at 6 (six) AM.    . Ginger, Zingiber officinalis, (GINGER ROOT) 550 MG CAPS Take 1 capsule by mouth daily at 6 (six) AM.    . glimepiride (AMARYL) 1 MG tablet Take 2 mg by mouth daily with breakfast.     . lisinopril (PRINIVIL,ZESTRIL) 20 MG tablet Take 20 mg by mouth daily.    . magnesium oxide (MAG-OX) 400 MG tablet Take 500 mg by mouth daily.    . Misc Natural Products (APPLE CIDER VINEGAR DIET PO) Take 450 mg by mouth daily.    . Multiple Vitamin (MULTIVITAMIN) capsule Take 1 capsule by mouth daily.    Marland Kitchen omeprazole (PRILOSEC) 20 MG capsule Take 20 mg by mouth daily.    . Potassium 99 MG TABS Take 99 mg by mouth daily as needed.    . Potassium Gluconate 2.5 MEQ TABS Take 1 tablet by mouth daily as needed.    . Semaglutide 14 MG TABS Take by mouth daily. Or placebo    . tamsulosin (FLOMAX) 0.4 MG CAPS capsule Take 1 capsule (0.4 mg total) by mouth 2 (two) times daily. 180 capsule 3  . torsemide (DEMADEX) 20 MG tablet Take 20 mg by mouth daily.    . Turmeric 500 MG CAPS Take 500 mg by mouth daily.    . vitamin E 400 UNIT capsule Take 400 Units by mouth daily.    . Vitamins C E (VITAMIN C & E  COMBINATION) 500-400 MG-UNIT CAPS Take 1 capsule by mouth daily.     No current facility-administered medications on file prior to visit.    There are no Patient Instructions on file for this visit. No follow-ups on file.   Kris Hartmann, NP

## 2021-02-02 ENCOUNTER — Encounter (INDEPENDENT_AMBULATORY_CARE_PROVIDER_SITE_OTHER): Payer: Self-pay

## 2021-02-02 ENCOUNTER — Other Ambulatory Visit: Payer: Self-pay

## 2021-02-02 ENCOUNTER — Ambulatory Visit (INDEPENDENT_AMBULATORY_CARE_PROVIDER_SITE_OTHER): Payer: Medicare Other | Admitting: Nurse Practitioner

## 2021-02-02 VITALS — BP 154/73 | HR 73 | Resp 16 | Wt 180.0 lb

## 2021-02-02 DIAGNOSIS — L97201 Non-pressure chronic ulcer of unspecified calf limited to breakdown of skin: Secondary | ICD-10-CM

## 2021-02-02 NOTE — Progress Notes (Signed)
History of Present Illness  There is no documented history at this time  Assessments & Plan   There are no diagnoses linked to this encounter.    Additional instructions  Subjective:  Patient presents with venous ulcer of the Right lower extremity.    Procedure:  3 layer unna wrap was placed Right lower extremity.   Plan:   Follow up in one week.   

## 2021-02-10 ENCOUNTER — Other Ambulatory Visit: Payer: Self-pay

## 2021-02-10 ENCOUNTER — Ambulatory Visit (INDEPENDENT_AMBULATORY_CARE_PROVIDER_SITE_OTHER): Payer: Medicare Other | Admitting: Nurse Practitioner

## 2021-02-10 ENCOUNTER — Encounter (INDEPENDENT_AMBULATORY_CARE_PROVIDER_SITE_OTHER): Payer: Self-pay

## 2021-02-10 VITALS — BP 149/73 | HR 86 | Ht 66.0 in | Wt 182.0 lb

## 2021-02-10 DIAGNOSIS — L97201 Non-pressure chronic ulcer of unspecified calf limited to breakdown of skin: Secondary | ICD-10-CM

## 2021-02-10 NOTE — Progress Notes (Signed)
History of Present Illness  There is no documented history at this time  Assessments & Plan   There are no diagnoses linked to this encounter.    Additional instructions  Subjective:  Patient presents with venous ulcer of the Right lower extremity.    Procedure:  3 layer unna wrap was placed Right lower extremity.   Plan:   Follow up in one week.   

## 2021-02-14 ENCOUNTER — Encounter (INDEPENDENT_AMBULATORY_CARE_PROVIDER_SITE_OTHER): Payer: Self-pay | Admitting: Nurse Practitioner

## 2021-02-17 ENCOUNTER — Other Ambulatory Visit: Payer: Self-pay

## 2021-02-17 ENCOUNTER — Ambulatory Visit (INDEPENDENT_AMBULATORY_CARE_PROVIDER_SITE_OTHER): Payer: Medicare Other | Admitting: Nurse Practitioner

## 2021-02-17 VITALS — BP 124/63 | HR 64 | Ht 66.0 in | Wt 179.0 lb

## 2021-02-17 DIAGNOSIS — L97201 Non-pressure chronic ulcer of unspecified calf limited to breakdown of skin: Secondary | ICD-10-CM | POA: Diagnosis not present

## 2021-02-17 DIAGNOSIS — I890 Lymphedema, not elsewhere classified: Secondary | ICD-10-CM

## 2021-02-17 DIAGNOSIS — I10 Essential (primary) hypertension: Secondary | ICD-10-CM

## 2021-03-01 ENCOUNTER — Encounter (INDEPENDENT_AMBULATORY_CARE_PROVIDER_SITE_OTHER): Payer: Self-pay | Admitting: Nurse Practitioner

## 2021-03-01 NOTE — Progress Notes (Signed)
Subjective:    Patient ID: Bill Peterson, male    DOB: 06-15-1935, 86 y.o.   MRN: [id number] Chief Complaint  Patient presents with   Follow-up    4wk rt unna boot check    The patient returns to the office for followup evaluation regarding leg swelling.  The patient also had blisters and was placed in Un[place] wraps to treat his right lower extremity.  The swelling has improved quite a bit and the pain associated with swelling has decreased substantially. There have not been any interval development of a ulcerations or wounds.   The patient does have compression socks to transition into.  The patient has been using compression routinely morning until night.  The patient also states elevation during the day and exercise is being done too.        Review of Systems  Cardiovascular:  Positive for leg swelling.  All other systems reviewed and are negative.     Objective:   Physical Exam Vitals reviewed.  HENT:     Head: Normocephalic.  Cardiovascular:     Rate and Rhythm: Normal rate.     Pulses: Normal pulses.  Pulmonary:     Effort: Pulmonary effort is normal.  Musculoskeletal:     Right lower leg: 1+ Edema present.     Left lower leg: 1+ Edema present.  Neurological:     Mental Status: He is alert and oriented to person, place, and time.  Psychiatric:        Mood and Affect: Mood normal.        Behavior: Behavior normal.        Thought Content: Thought content normal.        Judgment: Judgment normal.    BP 124/63   Pulse 64   Ht 5\' 6"  (1.676 m)   Wt 179 lb (81.2 kg)   BMI 28.89 kg/m   Past Medical History:  Diagnosis Date   BPH (benign prostatic hyperplasia)    CHF (congestive heart failure) (HCC)    Diabetes mellitus without complication ([place])    Hyperlipidemia    Hypertension     Social History   Socioeconomic History   Marital status: Married    Spouse name: Not on file   Number of children: Not on file   Years of education: Not on file    Highest education level: Not on file  Occupational History   Occupation: retired  Tobacco Use   Smoking status: Never   Smokeless tobacco: Never  Vaping Use   Vaping Use: Never used  Substance and Sexual Activity   Alcohol use: No   Drug use: No   Sexual activity: Yes  Other Topics Concern   Not on file  Social History Narrative   Lives at home with his wife. Active at baseline   Social Determinants of Health   Financial Resource Strain: Not on file  Food Insecurity: Not on file  Transportation Needs: Not on file  Physical Activity: Not on file  Stress: Not on file  Social Connections: Not on file  Intimate Partner Violence: Not on file    Past Surgical History:  Procedure Laterality Date   BACK SURGERY     BIOPSY PROSTATE  08/2018   CARDIAC SURGERY     STOMACH SURGERY      Family History  Problem Relation Age of Onset   Heart disease Mother    Diabetes Father    Prostate cancer Neg Hx    Kidney  cancer Neg Hx    Bladder Cancer Neg Hx     Allergies  Allergen Reactions   Heparin     REACTION: Unspecified   Repaglinide Other (See Comments)    Increased appetite Increased appetite Increased appetite     CBC Latest Ref Rng & Units 09/06/2017 09/05/2017 08/18/2017  WBC 3.8 - 10.6 K/uL 4.8 10.1 5.5  Hemoglobin 13.0 - 18.0 g/dL 10.5(L) 12.3(L) 10.7(L)  Hematocrit 40.0 - 52.0 % 31.2(L) 36.9(L) 31.4(L)  Platelets 150 - 440 K/uL 193 231 181      CMP     Component Value Date/Time   NA 141 09/06/2017 0645   K 3.5 09/06/2017 0645   CL 104 09/06/2017 0645   CO2 28 09/06/2017 0645   GLUCOSE 137 (H) 09/06/2017 0645   BUN 23 (H) 09/06/2017 0645   CREATININE 1.76 (H) 09/06/2017 0645   CALCIUM 8.6 (L) 09/06/2017 0645   PROT 5.7 (L) 11/04/2007 0400   ALBUMIN 3.0 (L) 08/18/2017 0500   AST 30 11/04/2007 0400   ALT 23 11/04/2007 0400   ALKPHOS 44 11/04/2007 0400   BILITOT 0.3 11/04/2007 0400   GFRNONAA 35 (L) 09/06/2017 0645   GFRAA 40 (L) 09/06/2017 0645      No results found.     Assessment & Plan:   1. Lymphedema No surgery or intervention at this point in time.    I have reviewed my discussion with the patient regarding venous insufficiency and secondary lymph edema and why it  causes symptoms. I have discussed with the patient the chronic skin changes that accompany these problems and the long term sequela such as ulceration and infection.  Patient will continue wearing graduated compression stockings class 1 (20-30 mmHg) on a daily basis a prescription was given to the patient to keep this updated. The patient will  put the stockings on first thing in the morning and removing them in the evening. The patient is instructed specifically not to sleep in the stockings.  In addition, behavioral modification including elevation during the day will be continued.  Diet and salt restriction was also discussed.  The patient will return in 3 months for evaluation of lower extremity edema.  However he is notified that if he has blisters or ulcerations to contact the office for follow-up sooner.  2. Skin ulcer of calf, limited to breakdown of skin, unspecified laterality ([place]) Resolved  3. Primary hypertension Continue antihypertensive medications as already ordered, these medications have been reviewed and there are no changes at this time.    Current Outpatient Medications on File Prior to Visit  Medication Sig Dispense Refill   amLODipine (NORVASC) 5 MG tablet Take 5 mg by mouth daily.      aspirin 325 MG tablet Take 325 mg by mouth daily.     atorvastatin (LIPITOR) 20 MG tablet Take 20 mg by mouth daily.     carvedilol (COREG) 12.5 MG tablet Take 12.5 mg by mouth 2 (two) times daily with a meal.     Cholecalciferol (VITAMIN D3) 10 MCG (400 UNIT) tablet Take 400 Units by mouth daily.     CINNAMON PO Take 1,000 tablets by mouth daily at 6 (six) AM.     empagliflozin (JARDIANCE) 10 MG TABS tablet Take 1 tablet by mouth daily with breakfast.      finasteride (PROSCAR) 5 MG tablet Take 1 tablet (5 mg total) by mouth daily. 30 tablet 11   Garlic 8469 MG CAPS Take 1,000 mg by mouth daily at  6 (six) AM.     Ginger, Zingiber officinalis, (GINGER ROOT) 550 MG CAPS Take 1 capsule by mouth daily at 6 (six) AM.     glimepiride (AMARYL) 1 MG tablet Take 2 mg by mouth daily with breakfast.      lisinopril (PRINIVIL,ZESTRIL) 20 MG tablet Take 20 mg by mouth daily.     magnesium oxide (MAG-OX) 400 MG tablet Take 500 mg by mouth daily.     Misc Natural Products (APPLE CIDER VINEGAR DIET PO) Take 450 mg by mouth daily.     Multiple Vitamin (MULTIVITAMIN) capsule Take 1 capsule by mouth daily.     omeprazole (PRILOSEC) 20 MG capsule Take 20 mg by mouth daily.     Potassium 99 MG TABS Take 99 mg by mouth daily as needed.     Potassium Gluconate 2.5 MEQ TABS Take 1 tablet by mouth daily as needed.     Semaglutide 14 MG TABS Take by mouth daily. Or placebo     tamsulosin (FLOMAX) 0.4 MG CAPS capsule Take 1 capsule (0.4 mg total) by mouth 2 (two) times daily. 180 capsule 3   torsemide (DEMADEX) 20 MG tablet Take 20 mg by mouth daily.     Turmeric 500 MG CAPS Take 500 mg by mouth daily.     vitamin E 400 UNIT capsule Take 400 Units by mouth daily.     Vitamins C E (VITAMIN C & E COMBINATION) 500-400 MG-UNIT CAPS Take 1 capsule by mouth daily.     No current facility-administered medications on file prior to visit.    There are no Patient Instructions on file for this visit. No follow-ups on file.   Kris Hartmann, NP

## 2021-05-13 ENCOUNTER — Encounter (INDEPENDENT_AMBULATORY_CARE_PROVIDER_SITE_OTHER): Payer: Self-pay | Admitting: Nurse Practitioner

## 2021-05-13 ENCOUNTER — Ambulatory Visit (INDEPENDENT_AMBULATORY_CARE_PROVIDER_SITE_OTHER): Payer: Medicare Other | Admitting: Nurse Practitioner

## 2021-05-13 ENCOUNTER — Other Ambulatory Visit: Payer: Self-pay

## 2021-05-13 VITALS — BP 157/74 | HR 77 | Resp 16 | Wt 177.2 lb

## 2021-05-13 DIAGNOSIS — I10 Essential (primary) hypertension: Secondary | ICD-10-CM

## 2021-05-13 DIAGNOSIS — L97201 Non-pressure chronic ulcer of unspecified calf limited to breakdown of skin: Secondary | ICD-10-CM

## 2021-05-13 DIAGNOSIS — I890 Lymphedema, not elsewhere classified: Secondary | ICD-10-CM | POA: Diagnosis not present

## 2021-05-16 DIAGNOSIS — E876 Hypokalemia: Secondary | ICD-10-CM | POA: Insufficient documentation

## 2021-05-22 ENCOUNTER — Encounter (INDEPENDENT_AMBULATORY_CARE_PROVIDER_SITE_OTHER): Payer: Self-pay | Admitting: Nurse Practitioner

## 2021-05-22 NOTE — Progress Notes (Signed)
Subjective:    Patient ID: Bill Peterson, male    DOB: 1934-10-11, 86 y.o.   MRN: [id number] Chief Complaint  Patient presents with   Follow-up    3 month follow up    Sanuel Peterson is an 86 year old male that returns to the office for followup evaluation regarding leg swelling.  The swelling has improved quite a bit and the pain associated with swelling has decreased substantially. There have been the interval development of some small blisters and wounds but these have currently healed.  Since the previous visit the patient has been wearing graduated compression stockings and has noted improvement in the lymphedema. The patient has been using compression routinely morning until night.  The patient also states elevation during the day and exercise is being done too.        Review of Systems  Cardiovascular:  Positive for leg swelling.  Skin:  Negative for wound.  All other systems reviewed and are negative.     Objective:   Physical Exam Vitals reviewed.  HENT:     Head: Normocephalic.  Cardiovascular:     Rate and Rhythm: Normal rate.     Pulses:          Posterior tibial pulses are 1+ on the right side and 1+ on the left side.  Pulmonary:     Effort: Pulmonary effort is normal.  Musculoskeletal:     Right lower leg: 1+ Edema present.     Left lower leg: 1+ Edema present.  Skin:    General: Skin is warm and dry.  Neurological:     Mental Status: He is alert and oriented to person, place, and time.  Psychiatric:        Mood and Affect: Mood normal.        Behavior: Behavior normal.        Thought Content: Thought content normal.        Judgment: Judgment normal.    BP (!) 157/74 (BP Location: Right Arm)   Pulse 77   Resp 16   Wt 177 lb 3.2 oz (80.4 kg)   BMI 28.60 kg/m   Past Medical History:  Diagnosis Date   BPH (benign prostatic hyperplasia)    CHF (congestive heart failure) (HCC)    Diabetes mellitus without complication ([place])    Hyperlipidemia     Hypertension     Social History   Socioeconomic History   Marital status: Married    Spouse name: Not on file   Number of children: Not on file   Years of education: Not on file   Highest education level: Not on file  Occupational History   Occupation: retired  Tobacco Use   Smoking status: Never   Smokeless tobacco: Never  Vaping Use   Vaping Use: Never used  Substance and Sexual Activity   Alcohol use: No   Drug use: No   Sexual activity: Yes  Other Topics Concern   Not on file  Social History Narrative   Lives at home with his wife. Active at baseline   Social Determinants of Health   Financial Resource Strain: Not on file  Food Insecurity: Not on file  Transportation Needs: Not on file  Physical Activity: Not on file  Stress: Not on file  Social Connections: Not on file  Intimate Partner Violence: Not on file    Past Surgical History:  Procedure Laterality Date   BACK SURGERY     BIOPSY PROSTATE  08/2018  CARDIAC SURGERY     STOMACH SURGERY      Family History  Problem Relation Age of Onset   Heart disease Mother    Diabetes Father    Prostate cancer Neg Hx    Kidney cancer Neg Hx    Bladder Cancer Neg Hx     Allergies  Allergen Reactions   Heparin     REACTION: Unspecified   Repaglinide Other (See Comments)    Increased appetite Increased appetite Increased appetite     CBC Latest Ref Rng & Units 09/06/2017 09/05/2017 08/18/2017  WBC 3.8 - 10.6 K/uL 4.8 10.1 5.5  Hemoglobin 13.0 - 18.0 g/dL 10.5(L) 12.3(L) 10.7(L)  Hematocrit 40.0 - 52.0 % 31.2(L) 36.9(L) 31.4(L)  Platelets 150 - 440 K/uL 193 231 181      CMP     Component Value Date/Time   NA 141 09/06/2017 0645   K 3.5 09/06/2017 0645   CL 104 09/06/2017 0645   CO2 28 09/06/2017 0645   GLUCOSE 137 (H) 09/06/2017 0645   BUN 23 (H) 09/06/2017 0645   CREATININE 1.76 (H) 09/06/2017 0645   CALCIUM 8.6 (L) 09/06/2017 0645   PROT 5.7 (L) 11/04/2007 0400   ALBUMIN 3.0 (L) 08/18/2017  0500   AST 30 11/04/2007 0400   ALT 23 11/04/2007 0400   ALKPHOS 44 11/04/2007 0400   BILITOT 0.3 11/04/2007 0400   GFRNONAA 35 (L) 09/06/2017 0645   GFRAA 40 (L) 09/06/2017 0645     No results found.     Assessment & Plan:   1. Lymphedema  No surgery or intervention at this point in time.    I have reviewed my previous discussion with the patient regarding swelling and why it  causes symptoms.  The patient is doing well with compression and will continue wearing graduated compression stockings class 1 (20-30 mmHg) on a daily basis a prescription was given. The patient will  continue wearing the stockings first thing in the morning and removing them in the evening. The patient is instructed specifically not to sleep in the stockings.    In addition, behavioral modification including elevation during the day and exercise will be continued.    Patient should follow-up on in 6 months  2. Skin ulcer of calf, limited to breakdown of skin, unspecified laterality ([place]) Currently patient has no wounds or ulcerations however patient advised that if he begins to have blisters, wounds or ulcerations he should contact our office as he may need to be placed in [place] wraps.  3. Primary hypertension Continue antihypertensive medications as already ordered, these medications have been reviewed and there are no changes at this time.    Current Outpatient Medications on File Prior to Visit  Medication Sig Dispense Refill   aspirin 325 MG tablet Take 325 mg by mouth daily.     atorvastatin (LIPITOR) 20 MG tablet Take 20 mg by mouth daily.     carvedilol (COREG) 12.5 MG tablet Take 12.5 mg by mouth 2 (two) times daily with a meal.     Cholecalciferol (VITAMIN D3) 10 MCG (400 UNIT) tablet Take 400 Units by mouth daily.     CINNAMON PO Take 1,000 tablets by mouth daily at 6 (six) AM.     empagliflozin (JARDIANCE) 10 MG TABS tablet Take 1 tablet by mouth daily with breakfast.     finasteride (PROSCAR)  5 MG tablet Take 1 tablet (5 mg total) by mouth daily. 30 tablet 11   Garlic 9977 MG CAPS Take  1,000 mg by mouth daily at 6 (six) AM.     Ginger, Zingiber officinalis, (GINGER ROOT) 550 MG CAPS Take 1 capsule by mouth daily at 6 (six) AM.     glimepiride (AMARYL) 1 MG tablet Take 2 mg by mouth daily with breakfast.      lisinopril (PRINIVIL,ZESTRIL) 20 MG tablet Take 20 mg by mouth daily.     magnesium oxide (MAG-OX) 400 MG tablet Take 500 mg by mouth daily.     Misc Natural Products (APPLE CIDER VINEGAR DIET PO) Take 450 mg by mouth daily.     Multiple Vitamin (MULTIVITAMIN) capsule Take 1 capsule by mouth daily.     omeprazole (PRILOSEC) 20 MG capsule Take 20 mg by mouth daily.     Potassium 99 MG TABS Take 99 mg by mouth daily as needed.     Potassium Gluconate 2.5 MEQ TABS Take 1 tablet by mouth daily as needed.     Semaglutide 14 MG TABS Take by mouth daily. Or placebo     tamsulosin (FLOMAX) 0.4 MG CAPS capsule Take 1 capsule (0.4 mg total) by mouth 2 (two) times daily. 180 capsule 3   torsemide (DEMADEX) 20 MG tablet Take 20 mg by mouth daily.     Turmeric 500 MG CAPS Take 500 mg by mouth daily.     vitamin E 400 UNIT capsule Take 400 Units by mouth daily.     Vitamins C E (VITAMIN C & E COMBINATION) 500-400 MG-UNIT CAPS Take 1 capsule by mouth daily.     amLODipine (NORVASC) 5 MG tablet Take 5 mg by mouth daily.  (Patient not taking: Reported on 05/13/2021)     No current facility-administered medications on file prior to visit.    There are no Patient Instructions on file for this visit. No follow-ups on file.   Kris Hartmann, NP

## 2021-11-11 ENCOUNTER — Ambulatory Visit (INDEPENDENT_AMBULATORY_CARE_PROVIDER_SITE_OTHER): Payer: Medicare Other | Admitting: Nurse Practitioner

## 2021-11-11 ENCOUNTER — Other Ambulatory Visit: Payer: Self-pay

## 2021-11-11 ENCOUNTER — Encounter (INDEPENDENT_AMBULATORY_CARE_PROVIDER_SITE_OTHER): Payer: Self-pay | Admitting: Nurse Practitioner

## 2021-11-11 VITALS — BP 131/71 | HR 106 | Resp 18 | Ht 66.0 in | Wt 149.0 lb

## 2021-11-11 DIAGNOSIS — N183 Chronic kidney disease, stage 3 unspecified: Secondary | ICD-10-CM | POA: Insufficient documentation

## 2021-11-11 DIAGNOSIS — I10 Essential (primary) hypertension: Secondary | ICD-10-CM

## 2021-11-11 DIAGNOSIS — I890 Lymphedema, not elsewhere classified: Secondary | ICD-10-CM | POA: Diagnosis not present

## 2021-11-11 DIAGNOSIS — N1832 Chronic kidney disease, stage 3b: Secondary | ICD-10-CM

## 2021-11-11 MED ORDER — SULFAMETHOXAZOLE-TRIMETHOPRIM 800-160 MG PO TABS
1.0000 | ORAL_TABLET | Freq: Two times a day (BID) | ORAL | 0 refills | Status: DC
Start: 2021-11-11 — End: 2022-01-20

## 2021-11-12 ENCOUNTER — Encounter (INDEPENDENT_AMBULATORY_CARE_PROVIDER_SITE_OTHER): Payer: Self-pay | Admitting: Nurse Practitioner

## 2021-11-12 NOTE — Progress Notes (Signed)
? ?Subjective:  ? ? Patient ID: Bill Peterson, male    DOB: 1935-07-03, 87 y.o.   MRN: [id number] ?Chief Complaint  ?Patient presents with  ? Follow-up  ?  6 months no studies  ? ? ?Bill Peterson is an 87 year old male who presents today for evaluation of his lower extremities due to lymphedema.  Today the patient has noted that he has been having multiple blisters and weeping from his legs.  He also has a significant amount of dry skin on his bilateral lower extremities.  He notes that he has not been wearing compression..  He has tried elevating however it has not decreased his lower extremity swelling significantly. ? ? ?Review of Systems  ?Cardiovascular:  Positive for leg swelling.  ?All other systems reviewed and are negative. ? ?   ?Objective:  ? Physical Exam ?Vitals reviewed.  ?HENT:  ?   Head: Normocephalic.  ?Cardiovascular:  ?   Rate and Rhythm: Normal rate.  ?Pulmonary:  ?   Effort: Pulmonary effort is normal.  ?Skin: ?   Comments: Areas of ulcers.  With large amounts of dried thickened skin bilaterally  ?Neurological:  ?   Mental Status: He is alert and oriented to person, place, and time.  ?Psychiatric:     ?   Mood and Affect: Mood normal.     ?   Behavior: Behavior normal.     ?   Thought Content: Thought content normal.     ?   Judgment: Judgment normal.  ? ? ?BP 131/71 (BP Location: Left Arm)   Pulse (!) 106   Resp 18   Ht '5\' 6"'$  (1.676 m)   Wt 149 lb (67.6 kg)   BMI 24.05 kg/m?  ? ?Past Medical History:  ?Diagnosis Date  ? BPH (benign prostatic hyperplasia)   ? CHF (congestive heart failure) ([place])   ? Diabetes mellitus without complication ([place])   ? Hyperlipidemia   ? Hypertension   ? ? ?Social History  ? ?Socioeconomic History  ? Marital status: Married  ?  Spouse name: Not on file  ? Number of children: Not on file  ? Years of education: Not on file  ? Highest education level: Not on file  ?Occupational History  ? Occupation: retired  ?Tobacco Use  ? Smoking status: Never  ? Smokeless  tobacco: Never  ?Vaping Use  ? Vaping Use: Never used  ?Substance and Sexual Activity  ? Alcohol use: No  ? Drug use: No  ? Sexual activity: Yes  ?Other Topics Concern  ? Not on file  ?Social History Narrative  ? Lives at home with his wife. Active at baseline  ? ?Social Determinants of Health  ? ?Financial Resource Strain: Not on file  ?Food Insecurity: Not on file  ?Transportation Needs: Not on file  ?Physical Activity: Not on file  ?Stress: Not on file  ?Social Connections: Not on file  ?Intimate Partner Violence: Not on file  ? ? ?Past Surgical History:  ?Procedure Laterality Date  ? BACK SURGERY    ? BIOPSY PROSTATE  08/2018  ? CARDIAC SURGERY    ? STOMACH SURGERY    ? ? ?Family History  ?Problem Relation Age of Onset  ? Heart disease Mother   ? Diabetes Father   ? Prostate cancer Neg Hx   ? Kidney cancer Neg Hx   ? Bladder Cancer Neg Hx   ? ? ?Allergies  ?Allergen Reactions  ? Heparin   ?  REACTION: Unspecified  ?  Repaglinide Other (See Comments)  ?  Increased appetite ?Increased appetite ?Increased appetite ?  ? ? ?CBC Latest Ref Rng & Units 09/06/2017 09/05/2017 08/18/2017  ?WBC 3.8 - 10.6 K/uL 4.8 10.1 5.5  ?Hemoglobin 13.0 - 18.0 g/dL 10.5(L) 12.3(L) 10.7(L)  ?Hematocrit 40.0 - 52.0 % 31.2(L) 36.9(L) 31.4(L)  ?Platelets 150 - 440 K/uL 193 231 181  ? ? ? ? ?CMP  ?   ?Component Value Date/Time  ? NA 141 09/06/2017 0645  ? K 3.5 09/06/2017 0645  ? CL 104 09/06/2017 0645  ? CO2 28 09/06/2017 0645  ? GLUCOSE 137 (H) 09/06/2017 0645  ? BUN 23 (H) 09/06/2017 0645  ? CREATININE 1.76 (H) 09/06/2017 0645  ? CALCIUM 8.6 (L) 09/06/2017 0645  ? PROT 5.7 (L) 11/04/2007 0400  ? ALBUMIN 3.0 (L) 08/18/2017 0500  ? AST 30 11/04/2007 0400  ? ALT 23 11/04/2007 0400  ? ALKPHOS 44 11/04/2007 0400  ? BILITOT 0.3 11/04/2007 0400  ? GFRNONAA 35 (L) 09/06/2017 0645  ? GFRAA 40 (L) 09/06/2017 0645  ? ? ? ?No results found. ? ?   ?Assessment & Plan:  ? ?1. Lymphedema ?No surgery or intervention at this point in time.   ? ?I have had a  long discussion with the patient regarding venous insufficiency and why it  causes symptoms, specifically venous ulceration . I have discussed with the patient the chronic skin changes that accompany venous insufficiency and the long term sequela such as infection and recurring  ulceration.  Patient will be placed in [employer] which will be changed weekly drainage permitting. ? ?In addition, behavioral modification including several periods of elevation of the lower extremities during the day will be continued. Achieving a position with the ankles at heart level was stressed to the patient ? ?The patient is instructed to begin routine exercise, especially walking on a daily basis ? ?The patient can be assessed for graduated compression stockings or wraps as well as a Lymph Pump once the ulcers are healed.  He will present to the office on a weekly basis for wrap changes.  We will also prescribe Bactrim due to the potential for underlying infection. ? ?2. Stage 3b chronic kidney disease ([place]) ?This is likely another component to the patient's edema ? ?3. Primary hypertension ?Continue antihypertensive medications as already ordered, these medications have been reviewed and there are no changes at this time.  ? ? ?Current Outpatient Medications on File Prior to Visit  ?Medication Sig Dispense Refill  ? aspirin 325 MG tablet Take 325 mg by mouth daily.    ? atorvastatin (LIPITOR) 20 MG tablet Take 20 mg by mouth daily.    ? carvedilol (COREG) 12.5 MG tablet Take 12.5 mg by mouth 2 (two) times daily with a meal.    ? Cholecalciferol (VITAMIN D3) 10 MCG (400 UNIT) tablet Take 400 Units by mouth daily.    ? CINNAMON PO Take 1,000 tablets by mouth daily at 6 (six) AM.    ? Garlic 5364 MG CAPS Take 1,000 mg by mouth daily at 6 (six) AM.    ? Ginger, Zingiber officinalis, (GINGER ROOT) 550 MG CAPS Take 1 capsule by mouth daily at 6 (six) AM.    ? glimepiride (AMARYL) 1 MG tablet Take 2 mg by mouth daily with breakfast.      ? lisinopril (PRINIVIL,ZESTRIL) 20 MG tablet Take 20 mg by mouth daily.    ? magnesium oxide (MAG-OX) 400 MG tablet Take 500 mg by mouth daily.    ?  Misc Natural Products (APPLE CIDER VINEGAR DIET PO) Take 450 mg by mouth daily.    ? Multiple Vitamin (MULTIVITAMIN) capsule Take 1 capsule by mouth daily.    ? omeprazole (PRILOSEC) 20 MG capsule Take 20 mg by mouth daily.    ? Potassium 99 MG TABS Take 99 mg by mouth daily as needed.    ? Potassium Gluconate 2.5 MEQ TABS Take 1 tablet by mouth daily as needed.    ? Semaglutide 14 MG TABS Take by mouth daily. Or placebo    ? tamsulosin (FLOMAX) 0.4 MG CAPS capsule Take 1 capsule (0.4 mg total) by mouth 2 (two) times daily. 180 capsule 3  ? torsemide (DEMADEX) 20 MG tablet Take 20 mg by mouth daily.    ? Turmeric 500 MG CAPS Take 500 mg by mouth daily.    ? vitamin E 400 UNIT capsule Take 400 Units by mouth daily.    ? Vitamins C E (VITAMIN C & E COMBINATION) 500-400 MG-UNIT CAPS Take 1 capsule by mouth daily.    ? amLODipine (NORVASC) 5 MG tablet Take 5 mg by mouth daily.  (Patient not taking: Reported on 05/13/2021)    ? finasteride (PROSCAR) 5 MG tablet Take 1 tablet (5 mg total) by mouth daily. (Patient not taking: Reported on 11/11/2021) 30 tablet 11  ? ?No current facility-administered medications on file prior to visit.  ? ? ?There are no Patient Instructions on file for this visit. ?No follow-ups on file. ? ? ?Kris Hartmann, NP ? ? ?

## 2021-11-18 ENCOUNTER — Other Ambulatory Visit: Payer: Self-pay

## 2021-11-18 ENCOUNTER — Ambulatory Visit (INDEPENDENT_AMBULATORY_CARE_PROVIDER_SITE_OTHER): Payer: Medicare Other | Admitting: Nurse Practitioner

## 2021-11-18 ENCOUNTER — Encounter (INDEPENDENT_AMBULATORY_CARE_PROVIDER_SITE_OTHER): Payer: Self-pay

## 2021-11-18 VITALS — BP 106/57 | HR 80 | Resp 16 | Wt 158.0 lb

## 2021-11-18 DIAGNOSIS — I890 Lymphedema, not elsewhere classified: Secondary | ICD-10-CM | POA: Diagnosis not present

## 2021-11-18 NOTE — Progress Notes (Signed)
History of Present Illness  There is no documented history at this time  Assessments & Plan   There are no diagnoses linked to this encounter.    Additional instructions  Subjective:  Patient presents with venous ulcer of the Bilateral lower extremity.    Procedure:  3 layer unna wrap was placed Bilateral lower extremity.   Plan:   Follow up in one week.  

## 2021-11-21 ENCOUNTER — Encounter (INDEPENDENT_AMBULATORY_CARE_PROVIDER_SITE_OTHER): Payer: Self-pay | Admitting: Nurse Practitioner

## 2021-11-25 ENCOUNTER — Ambulatory Visit (INDEPENDENT_AMBULATORY_CARE_PROVIDER_SITE_OTHER): Payer: Medicare Other | Admitting: Nurse Practitioner

## 2021-11-25 VITALS — BP 101/50 | HR 74 | Wt 161.0 lb

## 2021-11-25 DIAGNOSIS — L97201 Non-pressure chronic ulcer of unspecified calf limited to breakdown of skin: Secondary | ICD-10-CM | POA: Diagnosis not present

## 2021-11-25 NOTE — Progress Notes (Signed)
History of Present Illness  There is no documented history at this time  Assessments & Plan   There are no diagnoses linked to this encounter.    Additional instructions  Subjective:  Patient presents with venous ulcer of the Bilateral lower extremity.    Procedure:  3 layer unna wrap was placed Bilateral lower extremity.   Plan:   Follow up in one week.  

## 2021-11-26 ENCOUNTER — Encounter (INDEPENDENT_AMBULATORY_CARE_PROVIDER_SITE_OTHER): Payer: Self-pay | Admitting: Nurse Practitioner

## 2021-12-01 ENCOUNTER — Encounter (INDEPENDENT_AMBULATORY_CARE_PROVIDER_SITE_OTHER): Payer: Medicare Other

## 2021-12-01 ENCOUNTER — Ambulatory Visit (INDEPENDENT_AMBULATORY_CARE_PROVIDER_SITE_OTHER): Payer: Medicare Other | Admitting: Nurse Practitioner

## 2021-12-01 VITALS — BP 127/63 | HR 82 | Resp 16 | Ht 66.0 in | Wt 164.0 lb

## 2021-12-01 DIAGNOSIS — I890 Lymphedema, not elsewhere classified: Secondary | ICD-10-CM

## 2021-12-01 NOTE — Progress Notes (Signed)
History of Present Illness  There is no documented history at this time  Assessments & Plan   There are no diagnoses linked to this encounter.    Additional instructions  Subjective:  Patient presents with venous ulcer of the Bilateral lower extremity.    Procedure:  3 layer unna wrap was placed Bilateral lower extremity.   Plan:   Follow up in one week.  

## 2021-12-11 ENCOUNTER — Encounter (INDEPENDENT_AMBULATORY_CARE_PROVIDER_SITE_OTHER): Payer: Self-pay | Admitting: Nurse Practitioner

## 2021-12-16 ENCOUNTER — Encounter (INDEPENDENT_AMBULATORY_CARE_PROVIDER_SITE_OTHER): Payer: Self-pay

## 2021-12-16 ENCOUNTER — Ambulatory Visit (INDEPENDENT_AMBULATORY_CARE_PROVIDER_SITE_OTHER): Payer: Medicare Other | Admitting: Nurse Practitioner

## 2021-12-16 ENCOUNTER — Ambulatory Visit (INDEPENDENT_AMBULATORY_CARE_PROVIDER_SITE_OTHER): Payer: Medicare Other | Admitting: Vascular Surgery

## 2021-12-16 VITALS — BP 122/67 | HR 81 | Resp 17

## 2021-12-16 DIAGNOSIS — M7989 Other specified soft tissue disorders: Secondary | ICD-10-CM | POA: Diagnosis not present

## 2021-12-16 NOTE — Progress Notes (Signed)
History of Present Illness  There is no documented history at this time  Assessments & Plan   There are no diagnoses linked to this encounter.    Additional instructions  Subjective:  Patient presents with venous ulcer of the Bilateral lower extremity.    Procedure:  3 layer unna wrap was placed Bilateral lower extremity.   Plan:   Follow up in one week.  

## 2021-12-23 ENCOUNTER — Ambulatory Visit (INDEPENDENT_AMBULATORY_CARE_PROVIDER_SITE_OTHER): Payer: Medicare Other | Admitting: Nurse Practitioner

## 2021-12-23 VITALS — BP 125/71 | HR 80 | Resp 17

## 2021-12-23 DIAGNOSIS — E1122 Type 2 diabetes mellitus with diabetic chronic kidney disease: Secondary | ICD-10-CM | POA: Diagnosis not present

## 2021-12-23 DIAGNOSIS — N1832 Chronic kidney disease, stage 3b: Secondary | ICD-10-CM

## 2021-12-23 DIAGNOSIS — I10 Essential (primary) hypertension: Secondary | ICD-10-CM | POA: Diagnosis not present

## 2021-12-23 DIAGNOSIS — I890 Lymphedema, not elsewhere classified: Secondary | ICD-10-CM

## 2021-12-30 ENCOUNTER — Encounter (INDEPENDENT_AMBULATORY_CARE_PROVIDER_SITE_OTHER): Payer: Self-pay

## 2021-12-30 ENCOUNTER — Ambulatory Visit (INDEPENDENT_AMBULATORY_CARE_PROVIDER_SITE_OTHER): Payer: Medicare Other | Admitting: Nurse Practitioner

## 2021-12-30 VITALS — BP 127/74 | HR 76 | Resp 16

## 2021-12-30 DIAGNOSIS — L97201 Non-pressure chronic ulcer of unspecified calf limited to breakdown of skin: Secondary | ICD-10-CM

## 2021-12-30 NOTE — Progress Notes (Signed)
History of Present Illness  There is no documented history at this time  Assessments & Plan   There are no diagnoses linked to this encounter.    Additional instructions  Subjective:  Patient presents with venous ulcer of the Bilateral lower extremity.    Procedure:  3 layer unna wrap was placed Bilateral lower extremity.   Plan:   Follow up in one week.  

## 2022-01-02 ENCOUNTER — Encounter (INDEPENDENT_AMBULATORY_CARE_PROVIDER_SITE_OTHER): Payer: Self-pay | Admitting: Nurse Practitioner

## 2022-01-02 NOTE — Progress Notes (Signed)
? ?Subjective:  ? ? Patient ID: Bill Peterson, male    DOB: [birth year], 87 y.o.   MRN: [id number] ?No chief complaint on file. ? ? ?The patient returns to the office for followup evaluation regarding leg swelling.  The swelling has persisted and the pain associated with swelling continues. There have not been any interval development of a ulcerations or wounds. ? ?Since the previous visit the patient has been wearing Unna boots and has noted some improvement however the patient still has some swelling and wounds still unresolved. ? ? ?Review of Systems  ?Cardiovascular:  Positive for leg swelling.  ?Skin:  Positive for wound.  ?All other systems reviewed and are negative. ? ?   ?Objective:  ? Physical Exam ?Vitals reviewed.  ?HENT:  ?   Head: Normocephalic.  ?Cardiovascular:  ?   Rate and Rhythm: Normal rate.  ?Pulmonary:  ?   Effort: Pulmonary effort is normal.  ?Skin: ?   General: Skin is warm and dry.  ?Neurological:  ?   Mental Status: He is alert and oriented to person, place, and time.  ?Psychiatric:     ?   Mood and Affect: Mood normal.     ?   Behavior: Behavior normal.     ?   Thought Content: Thought content normal.     ?   Judgment: Judgment normal.  ? ? ?BP 125/71 (BP Location: Left Arm)   Pulse 80   Resp 17  ? ?Past Medical History:  ?Diagnosis Date  ? BPH (benign prostatic hyperplasia)   ? CHF (congestive heart failure) ([place])   ? Diabetes mellitus without complication ([place])   ? Hyperlipidemia   ? Hypertension   ? ? ?Social History  ? ?Socioeconomic History  ? Marital status: Married  ?  Spouse name: Not on file  ? Number of children: Not on file  ? Years of education: Not on file  ? Highest education level: Not on file  ?Occupational History  ? Occupation: retired  ?Tobacco Use  ? Smoking status: Never  ? Smokeless tobacco: Never  ?Vaping Use  ? Vaping Use: Never used  ?Substance and Sexual Activity  ? Alcohol use: No  ? Drug use: No  ? Sexual activity: Yes  ?Other Topics Concern  ? Not on file   ?Social History Narrative  ? Lives at home with his wife. Active at baseline  ? ?Social Determinants of Health  ? ?Financial Resource Strain: Not on file  ?Food Insecurity: Not on file  ?Transportation Needs: Not on file  ?Physical Activity: Not on file  ?Stress: Not on file  ?Social Connections: Not on file  ?Intimate Partner Violence: Not on file  ? ? ?Past Surgical History:  ?Procedure Laterality Date  ? BACK SURGERY    ? BIOPSY PROSTATE  08/2018  ? CARDIAC SURGERY    ? STOMACH SURGERY    ? ? ?Family History  ?Problem Relation Age of Onset  ? Heart disease Mother   ? Diabetes Father   ? Prostate cancer Neg Hx   ? Kidney cancer Neg Hx   ? Bladder Cancer Neg Hx   ? ? ?Allergies  ?Allergen Reactions  ? Heparin   ?  REACTION: Unspecified  ? Repaglinide Other (See Comments)  ?  Increased appetite ?Increased appetite ?Increased appetite ?  ? ? ? ?  Latest Ref Rng & Units 09/06/2017  ?  6:45 AM 09/05/2017  ?  4:14 PM 08/18/2017  ?  5:00 AM  ?  CBC  ?WBC 3.8 - 10.6 K/uL 4.8   10.1   5.5    ?Hemoglobin 13.0 - 18.0 g/dL 10.5   12.3   10.7    ?Hematocrit 40.0 - 52.0 % 31.2   36.9   31.4    ?Platelets 150 - 440 K/uL 193   231   181    ? ? ? ? ?CMP  ?   ?Component Value Date/Time  ? NA 141 09/06/2017 0645  ? K 3.5 09/06/2017 0645  ? CL 104 09/06/2017 0645  ? CO2 28 09/06/2017 0645  ? GLUCOSE 137 (H) 09/06/2017 0645  ? BUN 23 (H) 09/06/2017 0645  ? CREATININE 1.76 (H) 09/06/2017 0645  ? CALCIUM 8.6 (L) 09/06/2017 0645  ? PROT 5.7 (L) 11/04/2007 0400  ? ALBUMIN 3.0 (L) 08/18/2017 0500  ? AST 30 11/04/2007 0400  ? ALT 23 11/04/2007 0400  ? ALKPHOS 44 11/04/2007 0400  ? BILITOT 0.3 11/04/2007 0400  ? GFRNONAA 35 (L) 09/06/2017 0645  ? GFRAA 40 (L) 09/06/2017 0645  ? ? ? ?No results found. ? ?   ?Assessment & Plan:  ? ?1. Lymphedema ?The patient's swelling has improved as well as a previous ulcers have improved as well.  The patient could show more improvement and there is still some areas that continue to need healing.  Based on this  we will have the patient return to the unna wraps ? ?2. Primary hypertension ?Continue antihypertensive medications as already ordered, these medications have been reviewed and there are no changes at this time.  ? ?3. Type 2 diabetes mellitus with stage 3b chronic kidney disease, without long-term current use of insulin ([place]) ?Continue hypoglycemic medications as already ordered, these medications have been reviewed and there are no changes at this time. ? ?Hgb A1C to be monitored as already arranged by primary service  ? ? ?Current Outpatient Medications on File Prior to Visit  ?Medication Sig Dispense Refill  ? aspirin 325 MG tablet Take 325 mg by mouth daily.    ? atorvastatin (LIPITOR) 20 MG tablet Take 20 mg by mouth daily.    ? carvedilol (COREG) 12.5 MG tablet Take 12.5 mg by mouth 2 (two) times daily with a meal.    ? Cholecalciferol (VITAMIN D3) 10 MCG (400 UNIT) tablet Take 400 Units by mouth daily.    ? CINNAMON PO Take 1,000 tablets by mouth daily at 6 (six) AM.    ? Garlic 4098 MG CAPS Take 1,000 mg by mouth daily at 6 (six) AM.    ? Ginger, Zingiber officinalis, (GINGER ROOT) 550 MG CAPS Take 1 capsule by mouth daily at 6 (six) AM.    ? glimepiride (AMARYL) 1 MG tablet Take 2 mg by mouth daily with breakfast.     ? lisinopril (PRINIVIL,ZESTRIL) 20 MG tablet Take 20 mg by mouth daily.    ? magnesium oxide (MAG-OX) 400 MG tablet Take 500 mg by mouth daily.    ? Misc Natural Products (APPLE CIDER VINEGAR DIET PO) Take 450 mg by mouth daily.    ? Multiple Vitamin (MULTIVITAMIN) capsule Take 1 capsule by mouth daily.    ? omeprazole (PRILOSEC) 20 MG capsule Take 20 mg by mouth daily.    ? Potassium 99 MG TABS Take 99 mg by mouth daily as needed.    ? Potassium Gluconate 2.5 MEQ TABS Take 1 tablet by mouth daily as needed.    ? Semaglutide 14 MG TABS Take by mouth daily. Or placebo    ?  tamsulosin (FLOMAX) 0.4 MG CAPS capsule Take 1 capsule (0.4 mg total) by mouth 2 (two) times daily. 180 capsule 3  ?  torsemide (DEMADEX) 20 MG tablet Take 20 mg by mouth daily.    ? Turmeric 500 MG CAPS Take 500 mg by mouth daily.    ? vitamin E 400 UNIT capsule Take 400 Units by mouth daily.    ? Vitamins C E (VITAMIN C & E COMBINATION) 500-400 MG-UNIT CAPS Take 1 capsule by mouth daily.    ? amLODipine (NORVASC) 5 MG tablet Take 5 mg by mouth daily.  (Patient not taking: Reported on 05/13/2021)    ? finasteride (PROSCAR) 5 MG tablet Take 1 tablet (5 mg total) by mouth daily. (Patient not taking: Reported on 11/11/2021) 30 tablet 11  ? sulfamethoxazole-trimethoprim (BACTRIM DS) 800-160 MG tablet Take 1 tablet by mouth 2 (two) times daily. (Patient not taking: Reported on 12/16/2021) 20 tablet 0  ? ?No current facility-administered medications on file prior to visit.  ? ? ?There are no Patient Instructions on file for this visit. ?No follow-ups on file. ? ? ?Kris Hartmann, NP ? ? ?

## 2022-01-06 ENCOUNTER — Ambulatory Visit (INDEPENDENT_AMBULATORY_CARE_PROVIDER_SITE_OTHER): Payer: Medicare Other | Admitting: Nurse Practitioner

## 2022-01-06 ENCOUNTER — Encounter (INDEPENDENT_AMBULATORY_CARE_PROVIDER_SITE_OTHER): Payer: Self-pay | Admitting: Nurse Practitioner

## 2022-01-06 VITALS — BP 132/67 | HR 80 | Resp 16 | Wt 175.4 lb

## 2022-01-06 DIAGNOSIS — I890 Lymphedema, not elsewhere classified: Secondary | ICD-10-CM

## 2022-01-06 NOTE — Progress Notes (Signed)
History of Present Illness  There is no documented history at this time  Assessments & Plan   There are no diagnoses linked to this encounter.    Additional instructions  Subjective:  Patient presents with venous ulcer of the Bilateral lower extremity.    Procedure:  3 layer unna wrap was placed Bilateral lower extremity.   Plan:   Follow up in one week.  

## 2022-01-07 ENCOUNTER — Encounter (INDEPENDENT_AMBULATORY_CARE_PROVIDER_SITE_OTHER): Payer: Self-pay | Admitting: Nurse Practitioner

## 2022-01-13 ENCOUNTER — Ambulatory Visit (INDEPENDENT_AMBULATORY_CARE_PROVIDER_SITE_OTHER): Payer: Medicare Other | Admitting: Nurse Practitioner

## 2022-01-13 ENCOUNTER — Encounter (INDEPENDENT_AMBULATORY_CARE_PROVIDER_SITE_OTHER): Payer: Self-pay

## 2022-01-13 VITALS — BP 114/66 | HR 77 | Resp 16

## 2022-01-13 DIAGNOSIS — L97201 Non-pressure chronic ulcer of unspecified calf limited to breakdown of skin: Secondary | ICD-10-CM | POA: Diagnosis not present

## 2022-01-13 NOTE — Progress Notes (Signed)
History of Present Illness  There is no documented history at this time  Assessments & Plan   There are no diagnoses linked to this encounter.    Additional instructions  Subjective:  Patient presents with venous ulcer of the Bilateral lower extremity.    Procedure:  3 layer unna wrap was placed Bilateral lower extremity.   Plan:   Follow up in one week.  

## 2022-01-16 ENCOUNTER — Encounter (INDEPENDENT_AMBULATORY_CARE_PROVIDER_SITE_OTHER): Payer: Self-pay | Admitting: Nurse Practitioner

## 2022-01-20 ENCOUNTER — Encounter (INDEPENDENT_AMBULATORY_CARE_PROVIDER_SITE_OTHER): Payer: Self-pay

## 2022-01-20 ENCOUNTER — Ambulatory Visit (INDEPENDENT_AMBULATORY_CARE_PROVIDER_SITE_OTHER): Payer: Medicare Other | Admitting: Nurse Practitioner

## 2022-01-20 VITALS — BP 122/79 | HR 78 | Resp 17

## 2022-01-20 DIAGNOSIS — L97201 Non-pressure chronic ulcer of unspecified calf limited to breakdown of skin: Secondary | ICD-10-CM

## 2022-01-20 NOTE — Progress Notes (Unsigned)
History of Present Illness  There is no documented history at this time  Assessments & Plan   There are no diagnoses linked to this encounter.    Additional instructions  Subjective:  Patient presents with venous ulcer of the Bilateral lower extremity.    Procedure:  3 layer unna wrap was placed Bilateral lower extremity.   Plan:   Follow up in one week.  

## 2022-01-23 ENCOUNTER — Encounter (INDEPENDENT_AMBULATORY_CARE_PROVIDER_SITE_OTHER): Payer: Self-pay | Admitting: Nurse Practitioner

## 2022-01-27 ENCOUNTER — Encounter (INDEPENDENT_AMBULATORY_CARE_PROVIDER_SITE_OTHER): Payer: Self-pay | Admitting: Nurse Practitioner

## 2022-01-27 ENCOUNTER — Ambulatory Visit (INDEPENDENT_AMBULATORY_CARE_PROVIDER_SITE_OTHER): Payer: Medicare Other | Admitting: Nurse Practitioner

## 2022-01-27 VITALS — BP 121/69 | HR 82 | Resp 17 | Ht 66.0 in | Wt 173.0 lb

## 2022-01-27 DIAGNOSIS — I890 Lymphedema, not elsewhere classified: Secondary | ICD-10-CM | POA: Diagnosis not present

## 2022-01-27 DIAGNOSIS — E1122 Type 2 diabetes mellitus with diabetic chronic kidney disease: Secondary | ICD-10-CM

## 2022-01-27 DIAGNOSIS — N1832 Chronic kidney disease, stage 3b: Secondary | ICD-10-CM | POA: Diagnosis not present

## 2022-01-27 DIAGNOSIS — I10 Essential (primary) hypertension: Secondary | ICD-10-CM

## 2022-02-01 ENCOUNTER — Encounter (INDEPENDENT_AMBULATORY_CARE_PROVIDER_SITE_OTHER): Payer: Self-pay | Admitting: Nurse Practitioner

## 2022-02-01 NOTE — Progress Notes (Signed)
Subjective:    Patient ID: Bill Peterson, male    DOB: 23-Feb-1935, 87 y.o.   MRN: [id number] No chief complaint on file.   The patient returns today for follow-up evaluation of lower extremity edema and ulcerations.  Today the patient's lower extremity edema is much improved.  He does still have some swelling in the right lower extremity but overall it is improved from previous visit.  He notes that his legs feel much better as well.   Review of Systems  Cardiovascular:  Positive for leg swelling.  All other systems reviewed and are negative.     Objective:   Physical Exam Vitals reviewed.  HENT:     Head: Normocephalic.  Cardiovascular:     Rate and Rhythm: Normal rate.  Pulmonary:     Effort: Pulmonary effort is normal.  Musculoskeletal:     Right lower leg: Edema present.     Left lower leg: Edema present.  Skin:    General: Skin is warm and dry.  Neurological:     Mental Status: He is alert and oriented to person, place, and time.  Psychiatric:        Mood and Affect: Mood normal.        Behavior: Behavior normal.        Thought Content: Thought content normal.        Judgment: Judgment normal.    BP 121/69 (BP Location: Right Arm)   Pulse 82   Resp 17   Ht '5\' 6"'$  (1.676 m)   Wt 173 lb (78.5 kg)   BMI 27.92 kg/m   Past Medical History:  Diagnosis Date   BPH (benign prostatic hyperplasia)    CHF (congestive heart failure) (HCC)    Diabetes mellitus without complication ([place])    Hyperlipidemia    Hypertension     Social History   Socioeconomic History   Marital status: Married    Spouse name: Not on file   Number of children: Not on file   Years of education: Not on file   Highest education level: Not on file  Occupational History   Occupation: retired  Tobacco Use   Smoking status: Never   Smokeless tobacco: Never  Vaping Use   Vaping Use: Never used  Substance and Sexual Activity   Alcohol use: No   Drug use: No   Sexual activity: Yes   Other Topics Concern   Not on file  Social History Narrative   Lives at home with his wife. Active at baseline   Social Determinants of Health   Financial Resource Strain: Not on file  Food Insecurity: Not on file  Transportation Needs: Not on file  Physical Activity: Not on file  Stress: Not on file  Social Connections: Not on file  Intimate Partner Violence: Not on file    Past Surgical History:  Procedure Laterality Date   BACK SURGERY     BIOPSY PROSTATE  08/2018   CARDIAC SURGERY     STOMACH SURGERY      Family History  Problem Relation Age of Onset   Heart disease Mother    Diabetes Father    Prostate cancer Neg Hx    Kidney cancer Neg Hx    Bladder Cancer Neg Hx     Allergies  Allergen Reactions   Heparin     REACTION: Unspecified   Repaglinide Other (See Comments)    Increased appetite Increased appetite Increased appetite        Latest  Ref Rng & Units 09/06/2017    6:45 AM 09/05/2017    4:14 PM 08/18/2017    5:00 AM  CBC  WBC 3.8 - 10.6 K/uL 4.8   10.1   5.5    Hemoglobin 13.0 - 18.0 g/dL 10.5   12.3   10.7    Hematocrit 40.0 - 52.0 % 31.2   36.9   31.4    Platelets 150 - 440 K/uL 193   231   181        CMP     Component Value Date/Time   NA 141 09/06/2017 0645   K 3.5 09/06/2017 0645   CL 104 09/06/2017 0645   CO2 28 09/06/2017 0645   GLUCOSE 137 (H) 09/06/2017 0645   BUN 23 (H) 09/06/2017 0645   CREATININE 1.76 (H) 09/06/2017 0645   CALCIUM 8.6 (L) 09/06/2017 0645   PROT 5.7 (L) 11/04/2007 0400   ALBUMIN 3.0 (L) 08/18/2017 0500   AST 30 11/04/2007 0400   ALT 23 11/04/2007 0400   ALKPHOS 44 11/04/2007 0400   BILITOT 0.3 11/04/2007 0400   GFRNONAA 35 (L) 09/06/2017 0645   GFRAA 40 (L) 09/06/2017 0645     No results found.     Assessment & Plan:   1. Lymphedema The patient swelling is improved and this or his ulcerations.  We will have him return to the office in approximately 6 weeks for reevaluation of the swelling as well  as evaluation of the swelling in the right lower extremity.  He is advised to follow-up sooner if he begins to have open wounds or ulcerations.  2. Primary hypertension Continue antihypertensive medications as already ordered, these medications have been reviewed and there are no changes at this time.   3. Type 2 diabetes mellitus with stage 3b chronic kidney disease, without long-term current use of insulin ([facility]) Continue hypoglycemic medications as already ordered, these medications have been reviewed and there are no changes at this time.  Hgb A1C to be monitored as already arranged by primary service    Current Outpatient Medications on File Prior to Visit  Medication Sig Dispense Refill   aspirin 325 MG tablet Take 325 mg by mouth daily.     atorvastatin (LIPITOR) 20 MG tablet Take 20 mg by mouth daily.     carvedilol (COREG) 12.5 MG tablet Take 12.5 mg by mouth 2 (two) times daily with a meal.     Cholecalciferol (VITAMIN D3) 10 MCG (400 UNIT) tablet Take 400 Units by mouth daily.     CINNAMON PO Take 1,000 tablets by mouth daily at 6 (six) AM.     Garlic 0973 MG CAPS Take 1,000 mg by mouth daily at 6 (six) AM.     Ginger, Zingiber officinalis, (GINGER ROOT) 550 MG CAPS Take 1 capsule by mouth daily at 6 (six) AM.     glimepiride (AMARYL) 1 MG tablet Take 2 mg by mouth daily with breakfast.      lisinopril (PRINIVIL,ZESTRIL) 20 MG tablet Take 20 mg by mouth daily.     magnesium oxide (MAG-OX) 400 MG tablet Take 500 mg by mouth daily.     Misc Natural Products (APPLE CIDER VINEGAR DIET PO) Take 450 mg by mouth daily.     Multiple Vitamin (MULTIVITAMIN) capsule Take 1 capsule by mouth daily.     omeprazole (PRILOSEC) 20 MG capsule Take 20 mg by mouth daily.     Potassium 99 MG TABS Take 99 mg by mouth daily as needed.  Semaglutide 14 MG TABS Take by mouth daily. Or placebo     tamsulosin (FLOMAX) 0.4 MG CAPS capsule Take 1 capsule (0.4 mg total) by mouth 2 (two) times daily. 180  capsule 3   torsemide (DEMADEX) 20 MG tablet Take 20 mg by mouth daily.     Turmeric 500 MG CAPS Take 500 mg by mouth daily.     vitamin E 400 UNIT capsule Take 400 Units by mouth daily. (Patient not taking: Reported on 01/20/2022)     No current facility-administered medications on file prior to visit.    There are no Patient Instructions on file for this visit. No follow-ups on file.   Kris Hartmann, NP

## 2022-02-22 ENCOUNTER — Ambulatory Visit (INDEPENDENT_AMBULATORY_CARE_PROVIDER_SITE_OTHER): Payer: Medicare Other | Admitting: Nurse Practitioner

## 2022-02-22 ENCOUNTER — Ambulatory Visit (INDEPENDENT_AMBULATORY_CARE_PROVIDER_SITE_OTHER): Payer: Medicare Other

## 2022-02-22 ENCOUNTER — Encounter (INDEPENDENT_AMBULATORY_CARE_PROVIDER_SITE_OTHER): Payer: Self-pay | Admitting: Nurse Practitioner

## 2022-02-22 ENCOUNTER — Other Ambulatory Visit (INDEPENDENT_AMBULATORY_CARE_PROVIDER_SITE_OTHER): Payer: Self-pay | Admitting: Nurse Practitioner

## 2022-02-22 VITALS — BP 115/66 | HR 67 | Resp 16 | Ht 66.0 in | Wt 157.0 lb

## 2022-02-22 DIAGNOSIS — N1832 Chronic kidney disease, stage 3b: Secondary | ICD-10-CM

## 2022-02-22 DIAGNOSIS — M7989 Other specified soft tissue disorders: Secondary | ICD-10-CM | POA: Diagnosis not present

## 2022-02-22 DIAGNOSIS — I890 Lymphedema, not elsewhere classified: Secondary | ICD-10-CM | POA: Diagnosis not present

## 2022-02-22 DIAGNOSIS — I10 Essential (primary) hypertension: Secondary | ICD-10-CM | POA: Diagnosis not present

## 2022-02-22 DIAGNOSIS — E1122 Type 2 diabetes mellitus with diabetic chronic kidney disease: Secondary | ICD-10-CM | POA: Insufficient documentation

## 2022-03-02 ENCOUNTER — Ambulatory Visit (INDEPENDENT_AMBULATORY_CARE_PROVIDER_SITE_OTHER): Payer: Medicare Other | Admitting: Nurse Practitioner

## 2022-03-02 VITALS — BP 120/61 | HR 75 | Resp 16 | Ht 66.0 in | Wt 162.0 lb

## 2022-03-02 DIAGNOSIS — L97201 Non-pressure chronic ulcer of unspecified calf limited to breakdown of skin: Secondary | ICD-10-CM

## 2022-03-02 DIAGNOSIS — I890 Lymphedema, not elsewhere classified: Secondary | ICD-10-CM

## 2022-03-02 NOTE — Progress Notes (Signed)
History of Present Illness  There is no documented history at this time  Assessments & Plan   There are no diagnoses linked to this encounter.    Additional instructions  Subjective:  Patient presents with venous ulcer of the Bilateral lower extremity.    Procedure:  3 layer unna wrap was placed Bilateral lower extremity.   Plan:   Follow up in one week.  

## 2022-03-04 ENCOUNTER — Encounter (INDEPENDENT_AMBULATORY_CARE_PROVIDER_SITE_OTHER): Payer: Self-pay | Admitting: Nurse Practitioner

## 2022-03-08 ENCOUNTER — Encounter (INDEPENDENT_AMBULATORY_CARE_PROVIDER_SITE_OTHER): Payer: Self-pay

## 2022-03-08 ENCOUNTER — Ambulatory Visit (INDEPENDENT_AMBULATORY_CARE_PROVIDER_SITE_OTHER): Payer: Medicare Other | Admitting: Nurse Practitioner

## 2022-03-08 VITALS — BP 114/65 | HR 70 | Resp 17 | Ht 66.0 in | Wt 160.0 lb

## 2022-03-08 DIAGNOSIS — L97201 Non-pressure chronic ulcer of unspecified calf limited to breakdown of skin: Secondary | ICD-10-CM

## 2022-03-08 DIAGNOSIS — I890 Lymphedema, not elsewhere classified: Secondary | ICD-10-CM

## 2022-03-08 NOTE — Progress Notes (Signed)
History of Present Illness  There is no documented history at this time  Assessments & Plan   There are no diagnoses linked to this encounter.    Additional instructions  Subjective:  Patient presents with venous ulcer of the Bilateral lower extremity.    Procedure:  3 layer unna wrap was placed Bilateral lower extremity.   Plan:   Follow up in one week.  

## 2022-03-11 ENCOUNTER — Encounter (INDEPENDENT_AMBULATORY_CARE_PROVIDER_SITE_OTHER): Payer: Self-pay | Admitting: Nurse Practitioner

## 2022-03-11 NOTE — Progress Notes (Signed)
Subjective:    Patient ID: Bill Peterson, male    DOB: 04-24-1935, 87 y.o.   MRN: [id number] Chief Complaint  Patient presents with   Follow-up    ultrasound    Bill Peterson is an 87 year old male who returns today for follow-up evaluation of edema in his lower extremities.  The patient previously admitted to wraps due to open wounds and ulcerations and it was noted that the wounds are much better however today the patient has had worsening swelling with development of new wounds bilaterally.  Previously the patient had some swelling in his right lower extremity with concern for the possibility of reflux or DVT.  Noninvasive studies today show no evidence of DVT or superficial thrombophlebitis or reflux.  The patient notes that he has been trying to wear compression and elevate his lower extremities however despite this he has developed blisters with wounds and worsening of his swelling.    Review of Systems  Cardiovascular:  Positive for leg swelling.  Skin:  Positive for wound.  All other systems reviewed and are negative.      Objective:   Physical Exam Vitals reviewed.  HENT:     Head: Normocephalic.  Cardiovascular:     Rate and Rhythm: Normal rate.  Pulmonary:     Effort: Pulmonary effort is normal.  Musculoskeletal:     Right lower leg: Edema present.     Left lower leg: Edema present.  Skin:    General: Skin is warm and dry.  Neurological:     Mental Status: He is alert and oriented to person, place, and time.  Psychiatric:        Mood and Affect: Mood normal.        Behavior: Behavior normal.        Thought Content: Thought content normal.        Judgment: Judgment normal.     BP 115/66 (BP Location: Left Arm)   Pulse 67   Resp 16   Ht '5\' 6"'$  (1.676 m)   Wt 157 lb (71.2 kg)   BMI 25.34 kg/m   Past Medical History:  Diagnosis Date   BPH (benign prostatic hyperplasia)    CHF (congestive heart failure) ([facility])    Diabetes mellitus without complication  ([place])    Hyperlipidemia    Hypertension     Social History   Socioeconomic History   Marital status: Married    Spouse name: Not on file   Number of children: Not on file   Years of education: Not on file   Highest education level: Not on file  Occupational History   Occupation: retired  Tobacco Use   Smoking status: Never   Smokeless tobacco: Never  Vaping Use   Vaping Use: Never used  Substance and Sexual Activity   Alcohol use: No   Drug use: No   Sexual activity: Yes  Other Topics Concern   Not on file  Social History Narrative   Lives at home with his wife. Active at baseline   Social Determinants of Health   Financial Resource Strain: Low Risk  (10/15/2018)   Overall Financial Resource Strain (CARDIA)    Difficulty of Paying Living Expenses: Not hard at all  Food Insecurity: No Food Insecurity (09/14/2017)   Hunger Vital Sign    Worried About Running Out of Food in the Last Year: Never true    Ran Out of Food in the Last Year: Never true  Transportation Needs: No Transportation Needs (09/14/2017)  PRAPARE - Hydrologist (Medical): No    Lack of Transportation (Non-Medical): No  Physical Activity: Unknown (10/15/2018)   Exercise Vital Sign    Days of Exercise per Week: 3 days    Minutes of Exercise per Session: Not on file  Stress: No Stress Concern Present (10/15/2018)   [place]    Feeling of Stress : Only a little  Social Connections: Unknown (10/15/2018)   Social Connection and Isolation Panel [NHANES]    Frequency of Communication with Friends and Family: Not on file    Frequency of Social Gatherings with Friends and Family: More than three times a week    Attends Religious Services: Not on file    Active Member of Clubs or Organizations: Not on file    Attends Archivist Meetings: Not on file    Marital Status: Not on file  Intimate Partner Violence:  Not At Risk (09/14/2017)   Humiliation, Afraid, Rape, and Kick questionnaire    Fear of Current or Ex-Partner: No    Emotionally Abused: No    Physically Abused: No    Sexually Abused: No    Past Surgical History:  Procedure Laterality Date   BACK SURGERY     BIOPSY PROSTATE  08/2018   CARDIAC SURGERY     STOMACH SURGERY      Family History  Problem Relation Age of Onset   Heart disease Mother    Diabetes Father    Prostate cancer Neg Hx    Kidney cancer Neg Hx    Bladder Cancer Neg Hx     Allergies  Allergen Reactions   Heparin     REACTION: Unspecified   Repaglinide Other (See Comments)    Increased appetite Increased appetite Increased appetite        Latest Ref Rng & Units 09/06/2017    6:45 AM 09/05/2017    4:14 PM 08/18/2017    5:00 AM  CBC  WBC 3.8 - 10.6 K/uL 4.8  10.1  5.5   Hemoglobin 13.0 - 18.0 g/dL 10.5  12.3  10.7   Hematocrit 40.0 - 52.0 % 31.2  36.9  31.4   Platelets 150 - 440 K/uL 193  231  181       CMP     Component Value Date/Time   NA 141 09/06/2017 0645   K 3.5 09/06/2017 0645   CL 104 09/06/2017 0645   CO2 28 09/06/2017 0645   GLUCOSE 137 (H) 09/06/2017 0645   BUN 23 (H) 09/06/2017 0645   CREATININE 1.76 (H) 09/06/2017 0645   CALCIUM 8.6 (L) 09/06/2017 0645   PROT 5.7 (L) 11/04/2007 0400   ALBUMIN 3.0 (L) 08/18/2017 0500   AST 30 11/04/2007 0400   ALT 23 11/04/2007 0400   ALKPHOS 44 11/04/2007 0400   BILITOT 0.3 11/04/2007 0400   GFRNONAA 35 (L) 09/06/2017 0645   GFRAA 40 (L) 09/06/2017 0645     No results found.     Assessment & Plan:   1. Lymphedema No surgery or intervention at this point in time.    I have had a long discussion with the patient regarding venous insufficiency and why it  causes symptoms, specifically venous ulceration. I have discussed with the patient the chronic skin changes that accompany venous insufficiency and the long term sequela such as infection and recurring  ulceration.  Patient will be  placed in [employer] which will be changed  weekly drainage permitting.  In addition, behavioral modification including several periods of elevation of the lower extremities during the day will be continued. Achieving a position with the ankles at heart level was stressed to the patient  The patient is instructed to begin routine exercise, especially walking on a daily basis  In the future the patient can be assessed for graduated compression stockings or wraps as well as a Lymph Pump once the ulcers are healed.   2. Primary hypertension Continue antihypertensive medications as already ordered, these medications have been reviewed and there are no changes at this time.   3. Type 2 diabetes mellitus with stage 3b chronic kidney disease, without long-term current use of insulin ([facility]) Continue hypoglycemic medications as already ordered, these medications have been reviewed and there are no changes at this time.  Hgb A1C to be monitored as already arranged by primary service    Current Outpatient Medications on File Prior to Visit  Medication Sig Dispense Refill   aspirin 325 MG tablet Take 325 mg by mouth daily.     atorvastatin (LIPITOR) 20 MG tablet Take 20 mg by mouth daily.     carvedilol (COREG) 12.5 MG tablet Take 12.5 mg by mouth 2 (two) times daily with a meal.     Cholecalciferol (VITAMIN D3) 10 MCG (400 UNIT) tablet Take 400 Units by mouth daily.     CINNAMON PO Take 1,000 tablets by mouth daily at 6 (six) AM.     Garlic 5397 MG CAPS Take 1,000 mg by mouth daily at 6 (six) AM.     Ginger, Zingiber officinalis, (GINGER ROOT) 550 MG CAPS Take 1 capsule by mouth daily at 6 (six) AM.     glimepiride (AMARYL) 1 MG tablet Take 2 mg by mouth daily with breakfast.      lisinopril (PRINIVIL,ZESTRIL) 20 MG tablet Take 20 mg by mouth daily.     magnesium oxide (MAG-OX) 400 MG tablet Take 500 mg by mouth daily.     Misc Natural Products (APPLE CIDER VINEGAR DIET PO) Take 450 mg by mouth  daily.     Multiple Vitamin (MULTIVITAMIN) capsule Take 1 capsule by mouth daily.     omeprazole (PRILOSEC) 20 MG capsule Take 20 mg by mouth daily.     Potassium 99 MG TABS Take 99 mg by mouth daily as needed.     Semaglutide 14 MG TABS Take by mouth daily. Or placebo     tamsulosin (FLOMAX) 0.4 MG CAPS capsule Take 1 capsule (0.4 mg total) by mouth 2 (two) times daily. 180 capsule 3   torsemide (DEMADEX) 20 MG tablet Take 20 mg by mouth daily.     Turmeric 500 MG CAPS Take 500 mg by mouth daily.     vitamin E 400 UNIT capsule Take 400 Units by mouth daily.     No current facility-administered medications on file prior to visit.    There are no Patient Instructions on file for this visit. No follow-ups on file.   Kris Hartmann, NP

## 2022-03-13 ENCOUNTER — Encounter (INDEPENDENT_AMBULATORY_CARE_PROVIDER_SITE_OTHER): Payer: Self-pay | Admitting: Nurse Practitioner

## 2022-03-15 ENCOUNTER — Encounter (INDEPENDENT_AMBULATORY_CARE_PROVIDER_SITE_OTHER): Payer: Self-pay

## 2022-03-15 ENCOUNTER — Ambulatory Visit (INDEPENDENT_AMBULATORY_CARE_PROVIDER_SITE_OTHER): Payer: Medicare Other | Admitting: Nurse Practitioner

## 2022-03-15 VITALS — BP 114/64 | HR 67 | Resp 16 | Wt 161.0 lb

## 2022-03-15 DIAGNOSIS — L97201 Non-pressure chronic ulcer of unspecified calf limited to breakdown of skin: Secondary | ICD-10-CM

## 2022-03-15 DIAGNOSIS — I890 Lymphedema, not elsewhere classified: Secondary | ICD-10-CM | POA: Diagnosis not present

## 2022-03-15 NOTE — Progress Notes (Signed)
History of Present Illness  There is no documented history at this time  Assessments & Plan   There are no diagnoses linked to this encounter.    Additional instructions  Subjective:  Patient presents with venous ulcer of the Bilateral lower extremity.    Procedure:  3 layer unna wrap was placed Bilateral lower extremity.   Plan:   Follow up in one week.  

## 2022-03-22 ENCOUNTER — Encounter (INDEPENDENT_AMBULATORY_CARE_PROVIDER_SITE_OTHER): Payer: Self-pay

## 2022-03-22 ENCOUNTER — Ambulatory Visit (INDEPENDENT_AMBULATORY_CARE_PROVIDER_SITE_OTHER): Payer: Medicare Other | Admitting: Nurse Practitioner

## 2022-03-22 VITALS — BP 122/53 | HR 68 | Resp 16 | Wt 159.6 lb

## 2022-03-22 DIAGNOSIS — I890 Lymphedema, not elsewhere classified: Secondary | ICD-10-CM

## 2022-03-22 NOTE — Progress Notes (Signed)
History of Present Illness  There is no documented history at this time  Assessments & Plan   There are no diagnoses linked to this encounter.    Additional instructions  Subjective:  Patient presents with venous ulcer of the Bilateral lower extremity.    Procedure:  3 layer unna wrap was placed Bilateral lower extremity.   Plan:   Follow up in one week.  

## 2022-03-30 ENCOUNTER — Ambulatory Visit (INDEPENDENT_AMBULATORY_CARE_PROVIDER_SITE_OTHER): Payer: Medicare Other | Admitting: Nurse Practitioner

## 2022-03-30 ENCOUNTER — Encounter (INDEPENDENT_AMBULATORY_CARE_PROVIDER_SITE_OTHER): Payer: Self-pay | Admitting: Nurse Practitioner

## 2022-03-30 VITALS — BP 126/61 | HR 74 | Resp 16 | Wt 158.0 lb

## 2022-03-30 DIAGNOSIS — L97201 Non-pressure chronic ulcer of unspecified calf limited to breakdown of skin: Secondary | ICD-10-CM

## 2022-03-30 DIAGNOSIS — N183 Chronic kidney disease, stage 3 unspecified: Secondary | ICD-10-CM | POA: Diagnosis not present

## 2022-03-30 DIAGNOSIS — E1122 Type 2 diabetes mellitus with diabetic chronic kidney disease: Secondary | ICD-10-CM | POA: Diagnosis not present

## 2022-04-03 ENCOUNTER — Encounter (INDEPENDENT_AMBULATORY_CARE_PROVIDER_SITE_OTHER): Payer: Self-pay | Admitting: Nurse Practitioner

## 2022-04-17 ENCOUNTER — Encounter (INDEPENDENT_AMBULATORY_CARE_PROVIDER_SITE_OTHER): Payer: Self-pay | Admitting: Nurse Practitioner

## 2022-04-17 NOTE — Progress Notes (Signed)
Subjective:    Patient ID: Bill Peterson, male    DOB: 1935-06-23, 87 y.o.   MRN: [id number] Chief Complaint  Patient presents with   Follow-up    Ultrasound follow up     The patient returns today for follow-up of his lower extremity edema.  Patient was not in wraps for several weeks due to several open blisters and worsening of his edema.  Today the patient notes that the swelling is much better.    Review of Systems  Cardiovascular:  Positive for leg swelling.  Skin:  Negative for wound.  All other systems reviewed and are negative.      Objective:   Physical Exam Vitals reviewed.  HENT:     Head: Normocephalic.  Cardiovascular:     Rate and Rhythm: Normal rate.  Pulmonary:     Effort: Pulmonary effort is normal.  Musculoskeletal:     Right lower leg: Edema present.  Skin:    General: Skin is warm and dry.  Neurological:     Mental Status: He is alert and oriented to person, place, and time.  Psychiatric:        Mood and Affect: Mood normal.        Behavior: Behavior normal.        Thought Content: Thought content normal.        Judgment: Judgment normal.     BP 126/61 (BP Location: Right Arm)   Pulse 74   Resp 16   Wt 158 lb (71.7 kg)   BMI 25.50 kg/m   Past Medical History:  Diagnosis Date   BPH (benign prostatic hyperplasia)    CHF (congestive heart failure) (HCC)    Diabetes mellitus without complication ([place])    Hyperlipidemia    Hypertension     Social History   Socioeconomic History   Marital status: Married    Spouse name: Not on file   Number of children: Not on file   Years of education: Not on file   Highest education level: Not on file  Occupational History   Occupation: retired  Tobacco Use   Smoking status: Never   Smokeless tobacco: Never  Vaping Use   Vaping Use: Never used  Substance and Sexual Activity   Alcohol use: No   Drug use: No   Sexual activity: Yes  Other Topics Concern   Not on file  Social History  Narrative   Lives at home with his wife. Active at baseline   Social Determinants of Health   Financial Resource Strain: Low Risk  (10/15/2018)   Overall Financial Resource Strain (CARDIA)    Difficulty of Paying Living Expenses: Not hard at all  Food Insecurity: No Food Insecurity (09/14/2017)   Hunger Vital Sign    Worried About Running Out of Food in the Last Year: Never true    Ran Out of Food in the Last Year: Never true  Transportation Needs: No Transportation Needs (09/14/2017)   PRAPARE - Hydrologist (Medical): No    Lack of Transportation (Non-Medical): No  Physical Activity: Unknown (10/15/2018)   Exercise Vital Sign    Days of Exercise per Week: 3 days    Minutes of Exercise per Session: Not on file  Stress: No Stress Concern Present (10/15/2018)   [place]    Feeling of Stress : Only a little  Social Connections: Unknown (10/15/2018)   Social Connection and Isolation Panel [NHANES]  Frequency of Communication with Friends and Family: Not on file    Frequency of Social Gatherings with Friends and Family: More than three times a week    Attends Religious Services: Not on file    Active Member of Clubs or Organizations: Not on file    Attends Archivist Meetings: Not on file    Marital Status: Not on file  Intimate Partner Violence: Not At Risk (09/14/2017)   Humiliation, Afraid, Rape, and Kick questionnaire    Fear of Current or Ex-Partner: No    Emotionally Abused: No    Physically Abused: No    Sexually Abused: No    Past Surgical History:  Procedure Laterality Date   BACK SURGERY     BIOPSY PROSTATE  08/2018   CARDIAC SURGERY     STOMACH SURGERY      Family History  Problem Relation Age of Onset   Heart disease Mother    Diabetes Father    Prostate cancer Neg Hx    Kidney cancer Neg Hx    Bladder Cancer Neg Hx     Allergies  Allergen Reactions    Heparin     REACTION: Unspecified   Repaglinide Other (See Comments)    Increased appetite Increased appetite Increased appetite        Latest Ref Rng & Units 09/06/2017    6:45 AM 09/05/2017    4:14 PM 08/18/2017    5:00 AM  CBC  WBC 3.8 - 10.6 K/uL 4.8  10.1  5.5   Hemoglobin 13.0 - 18.0 g/dL 10.5  12.3  10.7   Hematocrit 40.0 - 52.0 % 31.2  36.9  31.4   Platelets 150 - 440 K/uL 193  231  181       CMP     Component Value Date/Time   NA 141 09/06/2017 0645   K 3.5 09/06/2017 0645   CL 104 09/06/2017 0645   CO2 28 09/06/2017 0645   GLUCOSE 137 (H) 09/06/2017 0645   BUN 23 (H) 09/06/2017 0645   CREATININE 1.76 (H) 09/06/2017 0645   CALCIUM 8.6 (L) 09/06/2017 0645   PROT 5.7 (L) 11/04/2007 0400   ALBUMIN 3.0 (L) 08/18/2017 0500   AST 30 11/04/2007 0400   ALT 23 11/04/2007 0400   ALKPHOS 44 11/04/2007 0400   BILITOT 0.3 11/04/2007 0400   GFRNONAA 35 (L) 09/06/2017 0645   GFRAA 40 (L) 09/06/2017 0645     No results found.     Assessment & Plan:   1. Skin ulcer of calf, limited to breakdown of skin, unspecified laterality ([place]) Today the patient's open wounds are healed.  He is advised to use medical grade compression to prevent recurrence.  He is advised to elevate his lower extremities when active as possible.  We will have her return in 6 weeks for reevaluation of lower extremities.  2. Diabetes mellitus with stage 3 chronic kidney disease ([place]) Continue hypoglycemic medications as already ordered, these medications have been reviewed and there are no changes at this time.  Hgb A1C to be monitored as already arranged by primary service    Current Outpatient Medications on File Prior to Visit  Medication Sig Dispense Refill   aspirin 325 MG tablet Take 325 mg by mouth daily.     atorvastatin (LIPITOR) 20 MG tablet Take 20 mg by mouth daily.     carvedilol (COREG) 12.5 MG tablet Take 12.5 mg by mouth 2 (two) times daily with a meal.  Cholecalciferol  (VITAMIN D3) 10 MCG (400 UNIT) tablet Take 400 Units by mouth daily.     CINNAMON PO Take 1,000 tablets by mouth daily at 6 (six) AM.     Ferrous Sulfate (IRON) 325 (65 Fe) MG TABS Take 1 tablet by mouth daily.     Garlic 9024 MG CAPS Take 1,000 mg by mouth daily at 6 (six) AM.     Ginger, Zingiber officinalis, (GINGER ROOT) 550 MG CAPS Take 1 capsule by mouth daily at 6 (six) AM.     glimepiride (AMARYL) 1 MG tablet Take 2 mg by mouth daily with breakfast.      lisinopril (PRINIVIL,ZESTRIL) 20 MG tablet Take 20 mg by mouth daily.     magnesium oxide (MAG-OX) 400 MG tablet Take 500 mg by mouth daily.     Misc Natural Products (APPLE CIDER VINEGAR DIET PO) Take 450 mg by mouth daily.     Multiple Vitamin (MULTIVITAMIN) capsule Take 1 capsule by mouth daily.     omeprazole (PRILOSEC) 20 MG capsule Take 20 mg by mouth daily.     Potassium 99 MG TABS Take 99 mg by mouth daily as needed.     Semaglutide 14 MG TABS Take by mouth daily. Or placebo     tamsulosin (FLOMAX) 0.4 MG CAPS capsule Take 1 capsule (0.4 mg total) by mouth 2 (two) times daily. 180 capsule 3   torsemide (DEMADEX) 20 MG tablet Take 20 mg by mouth daily.     Turmeric 500 MG CAPS Take 500 mg by mouth daily.     vitamin E 400 UNIT capsule Take 400 Units by mouth daily.     No current facility-administered medications on file prior to visit.    There are no Patient Instructions on file for this visit. No follow-ups on file.   Kris Hartmann, NP

## 2022-05-11 ENCOUNTER — Ambulatory Visit (INDEPENDENT_AMBULATORY_CARE_PROVIDER_SITE_OTHER): Payer: Medicare Other | Admitting: Nurse Practitioner

## 2022-06-01 ENCOUNTER — Ambulatory Visit (INDEPENDENT_AMBULATORY_CARE_PROVIDER_SITE_OTHER): Payer: Medicare Other | Admitting: Nurse Practitioner

## 2022-06-01 ENCOUNTER — Encounter (INDEPENDENT_AMBULATORY_CARE_PROVIDER_SITE_OTHER): Payer: Self-pay | Admitting: Nurse Practitioner

## 2022-06-01 VITALS — BP 109/60 | HR 90 | Resp 16 | Wt 167.0 lb

## 2022-06-01 DIAGNOSIS — I350 Nonrheumatic aortic (valve) stenosis: Secondary | ICD-10-CM | POA: Diagnosis not present

## 2022-06-01 DIAGNOSIS — L97201 Non-pressure chronic ulcer of unspecified calf limited to breakdown of skin: Secondary | ICD-10-CM

## 2022-06-01 DIAGNOSIS — N1832 Chronic kidney disease, stage 3b: Secondary | ICD-10-CM | POA: Diagnosis not present

## 2022-06-02 ENCOUNTER — Encounter (INDEPENDENT_AMBULATORY_CARE_PROVIDER_SITE_OTHER): Payer: Self-pay | Admitting: Nurse Practitioner

## 2022-06-02 NOTE — Progress Notes (Signed)
Subjective:    Patient ID: Bill Peterson, male    DOB: 1935-01-10, 87 y.o.   MRN: [id number] Chief Complaint  Patient presents with   Follow-up    6 weeks follow up    The patient returns today for follow-up evaluation of his bilateral lower extremity lymphedema.  The patient prior to meeting lateral Unna wraps had healed all wounds and ulcers.  Today he presents with multiple shallow wounds and blister formation.  Patient notes that he has been wearing medical grade compression socks.  He notes that his dietary issues contribute to his swelling.  He denies any fevers or chills.    Review of Systems  Cardiovascular:  Positive for leg swelling.  Skin:  Positive for wound.  All other systems reviewed and are negative.      Objective:   Physical Exam Vitals reviewed.  HENT:     Head: Normocephalic.  Cardiovascular:     Rate and Rhythm: Normal rate.  Pulmonary:     Effort: Pulmonary effort is normal.  Musculoskeletal:     Right lower leg: Edema present.     Left lower leg: Edema present.  Skin:    General: Skin is warm and dry.     Findings: Wound present.  Neurological:     Mental Status: He is alert and oriented to person, place, and time.  Psychiatric:        Mood and Affect: Mood normal.        Behavior: Behavior normal.        Thought Content: Thought content normal.        Judgment: Judgment normal.     BP 109/60 (BP Location: Left Arm)   Pulse 90   Resp 16   Wt 167 lb (75.8 kg)   BMI 26.95 kg/m   Past Medical History:  Diagnosis Date   BPH (benign prostatic hyperplasia)    CHF (congestive heart failure) (HCC)    Diabetes mellitus without complication ([place])    Hyperlipidemia    Hypertension     Social History   Socioeconomic History   Marital status: Married    Spouse name: Not on file   Number of children: Not on file   Years of education: Not on file   Highest education level: Not on file  Occupational History   Occupation: retired   Tobacco Use   Smoking status: Never   Smokeless tobacco: Never  Vaping Use   Vaping Use: Never used  Substance and Sexual Activity   Alcohol use: No   Drug use: No   Sexual activity: Yes  Other Topics Concern   Not on file  Social History Narrative   Lives at home with his wife. Active at baseline   Social Determinants of Health   Financial Resource Strain: Low Risk  (10/15/2018)   Overall Financial Resource Strain (CARDIA)    Difficulty of Paying Living Expenses: Not hard at all  Food Insecurity: No Food Insecurity (09/14/2017)   Hunger Vital Sign    Worried About Running Out of Food in the Last Year: Never true    Ran Out of Food in the Last Year: Never true  Transportation Needs: No Transportation Needs (09/14/2017)   PRAPARE - Hydrologist (Medical): No    Lack of Transportation (Non-Medical): No  Physical Activity: Unknown (10/15/2018)   Exercise Vital Sign    Days of Exercise per Week: 3 days    Minutes of Exercise per Session:  Not on file  Stress: No Stress Concern Present (10/15/2018)   [place]    Feeling of Stress : Only a little  Social Connections: Unknown (10/15/2018)   Social Connection and Isolation Panel [NHANES]    Frequency of Communication with Friends and Family: Not on file    Frequency of Social Gatherings with Friends and Family: More than three times a week    Attends Religious Services: Not on file    Active Member of Clubs or Organizations: Not on file    Attends Archivist Meetings: Not on file    Marital Status: Not on file  Intimate Partner Violence: Not At Risk (09/14/2017)   Humiliation, Afraid, Rape, and Kick questionnaire    Fear of Current or Ex-Partner: No    Emotionally Abused: No    Physically Abused: No    Sexually Abused: No    Past Surgical History:  Procedure Laterality Date   BACK SURGERY     BIOPSY PROSTATE  08/2018    CARDIAC SURGERY     STOMACH SURGERY      Family History  Problem Relation Age of Onset   Heart disease Mother    Diabetes Father    Prostate cancer Neg Hx    Kidney cancer Neg Hx    Bladder Cancer Neg Hx     Allergies  Allergen Reactions   Heparin     REACTION: Unspecified   Repaglinide Other (See Comments)    Increased appetite Increased appetite Increased appetite        Latest Ref Rng & Units 09/06/2017    6:45 AM 09/05/2017    4:14 PM 08/18/2017    5:00 AM  CBC  WBC 3.8 - 10.6 K/uL 4.8  10.1  5.5   Hemoglobin 13.0 - 18.0 g/dL 10.5  12.3  10.7   Hematocrit 40.0 - 52.0 % 31.2  36.9  31.4   Platelets 150 - 440 K/uL 193  231  181       CMP     Component Value Date/Time   NA 141 09/06/2017 0645   K 3.5 09/06/2017 0645   CL 104 09/06/2017 0645   CO2 28 09/06/2017 0645   GLUCOSE 137 (H) 09/06/2017 0645   BUN 23 (H) 09/06/2017 0645   CREATININE 1.76 (H) 09/06/2017 0645   CALCIUM 8.6 (L) 09/06/2017 0645   PROT 5.7 (L) 11/04/2007 0400   ALBUMIN 3.0 (L) 08/18/2017 0500   AST 30 11/04/2007 0400   ALT 23 11/04/2007 0400   ALKPHOS 44 11/04/2007 0400   BILITOT 0.3 11/04/2007 0400   GFRNONAA 35 (L) 09/06/2017 0645   GFRAA 40 (L) 09/06/2017 0645     No results found.     Assessment & Plan:   1. Skin ulcer of calf, limited to breakdown of skin, unspecified laterality ([place]) Based on the patient's pattern of reforming shortly after being on wound rounds, I suspect that he is not utilizing medical grade compression as directed.  The patient has had good results with Unna boots previously.  We will place the patient in a boot for bilateral leg wraps.  The patient has concerns about returning to get his ABIs done and we have offered home health however he does not wish to have home health currently.  These are drastically changed on a weekly basis.  We will reevaluate his progress in 4 weeks.  2. Aortic valve stenosis, etiology of cardiac valve disease unspecified This  also likely contributes to the patient's lower extremity swelling.  3. Stage 3b chronic kidney disease ([place]) Previous to lower extremity edema   Current Outpatient Medications on File Prior to Visit  Medication Sig Dispense Refill   aspirin 325 MG tablet Take 325 mg by mouth daily.     atorvastatin (LIPITOR) 20 MG tablet Take 20 mg by mouth daily.     carvedilol (COREG) 12.5 MG tablet Take 12.5 mg by mouth 2 (two) times daily with a meal.     Cholecalciferol (VITAMIN D3) 10 MCG (400 UNIT) tablet Take 400 Units by mouth daily.     CINNAMON PO Take 1,000 tablets by mouth daily at 6 (six) AM.     Ferrous Sulfate (IRON) 325 (65 Fe) MG TABS Take 1 tablet by mouth daily.     Garlic 6160 MG CAPS Take 1,000 mg by mouth daily at 6 (six) AM.     Ginger, Zingiber officinalis, (GINGER ROOT) 550 MG CAPS Take 1 capsule by mouth daily at 6 (six) AM.     glimepiride (AMARYL) 1 MG tablet Take 2 mg by mouth daily with breakfast.      lisinopril (PRINIVIL,ZESTRIL) 20 MG tablet Take 20 mg by mouth daily.     magnesium oxide (MAG-OX) 400 MG tablet Take 500 mg by mouth daily.     Misc Natural Products (APPLE CIDER VINEGAR DIET PO) Take 450 mg by mouth daily.     Multiple Vitamin (MULTIVITAMIN) capsule Take 1 capsule by mouth daily.     omeprazole (PRILOSEC) 20 MG capsule Take 20 mg by mouth daily.     Potassium 99 MG TABS Take 99 mg by mouth daily as needed.     Semaglutide 14 MG TABS Take by mouth daily. Or placebo     tamsulosin (FLOMAX) 0.4 MG CAPS capsule Take 1 capsule (0.4 mg total) by mouth 2 (two) times daily. 180 capsule 3   torsemide (DEMADEX) 20 MG tablet Take 20 mg by mouth daily.     Turmeric 500 MG CAPS Take 500 mg by mouth daily.     vitamin E 400 UNIT capsule Take 400 Units by mouth daily.     No current facility-administered medications on file prior to visit.    There are no Patient Instructions on file for this visit. No follow-ups on file.   Kris Hartmann, NP

## 2022-06-08 ENCOUNTER — Encounter (INDEPENDENT_AMBULATORY_CARE_PROVIDER_SITE_OTHER): Payer: Self-pay

## 2022-06-08 ENCOUNTER — Encounter (INDEPENDENT_AMBULATORY_CARE_PROVIDER_SITE_OTHER): Payer: Medicare Other

## 2022-06-08 ENCOUNTER — Ambulatory Visit (INDEPENDENT_AMBULATORY_CARE_PROVIDER_SITE_OTHER): Payer: Medicare Other | Admitting: Nurse Practitioner

## 2022-06-08 VITALS — BP 101/44 | HR 75 | Resp 17 | Ht 66.0 in | Wt 170.8 lb

## 2022-06-08 DIAGNOSIS — L97201 Non-pressure chronic ulcer of unspecified calf limited to breakdown of skin: Secondary | ICD-10-CM

## 2022-06-08 NOTE — Progress Notes (Signed)
History of Present Illness  There is no documented history at this time  Assessments & Plan   There are no diagnoses linked to this encounter.    Additional instructions  Subjective:  Patient presents with venous ulcer of the Bilateral lower extremity.    Procedure:  3 layer unna wrap was placed Bilateral lower extremity.   Plan:   Follow up in one week.  

## 2022-06-11 ENCOUNTER — Encounter (INDEPENDENT_AMBULATORY_CARE_PROVIDER_SITE_OTHER): Payer: Self-pay | Admitting: Nurse Practitioner

## 2022-06-15 ENCOUNTER — Encounter (INDEPENDENT_AMBULATORY_CARE_PROVIDER_SITE_OTHER): Payer: Self-pay

## 2022-06-15 ENCOUNTER — Encounter (INDEPENDENT_AMBULATORY_CARE_PROVIDER_SITE_OTHER): Payer: PRIVATE HEALTH INSURANCE

## 2022-06-15 ENCOUNTER — Ambulatory Visit (INDEPENDENT_AMBULATORY_CARE_PROVIDER_SITE_OTHER): Payer: Medicare Other | Admitting: Nurse Practitioner

## 2022-06-15 VITALS — BP 124/62 | HR 93 | Resp 16 | Wt 166.0 lb

## 2022-06-15 DIAGNOSIS — L97201 Non-pressure chronic ulcer of unspecified calf limited to breakdown of skin: Secondary | ICD-10-CM | POA: Diagnosis not present

## 2022-06-15 NOTE — Progress Notes (Signed)
History of Present Illness  There is no documented history at this time  Assessments & Plan   There are no diagnoses linked to this encounter.    Additional instructions  Subjective:  Patient presents with venous ulcer of the Bilateral lower extremity.    Procedure:  3 layer unna wrap was placed Bilateral lower extremity.   Plan:   Follow up in one week.  

## 2022-06-16 ENCOUNTER — Encounter (INDEPENDENT_AMBULATORY_CARE_PROVIDER_SITE_OTHER): Payer: Medicare Other

## 2022-06-22 ENCOUNTER — Encounter (INDEPENDENT_AMBULATORY_CARE_PROVIDER_SITE_OTHER): Payer: PRIVATE HEALTH INSURANCE

## 2022-06-22 ENCOUNTER — Ambulatory Visit (INDEPENDENT_AMBULATORY_CARE_PROVIDER_SITE_OTHER): Payer: Medicare Other | Admitting: Nurse Practitioner

## 2022-06-22 ENCOUNTER — Encounter (INDEPENDENT_AMBULATORY_CARE_PROVIDER_SITE_OTHER): Payer: Self-pay

## 2022-06-22 VITALS — BP 111/64 | HR 85 | Resp 18 | Ht 66.0 in | Wt 171.2 lb

## 2022-06-22 DIAGNOSIS — L97201 Non-pressure chronic ulcer of unspecified calf limited to breakdown of skin: Secondary | ICD-10-CM | POA: Diagnosis not present

## 2022-06-22 NOTE — Progress Notes (Signed)
History of Present Illness  There is no documented history at this time  Assessments & Plan   There are no diagnoses linked to this encounter.    Additional instructions  Subjective:  Patient presents with venous ulcer of the Bilateral lower extremity.    Procedure:  3 layer unna wrap was placed Bilateral lower extremity.   Plan:   Follow up in one week.  

## 2022-06-24 ENCOUNTER — Encounter (INDEPENDENT_AMBULATORY_CARE_PROVIDER_SITE_OTHER): Payer: Self-pay | Admitting: Nurse Practitioner

## 2022-06-25 ENCOUNTER — Encounter (INDEPENDENT_AMBULATORY_CARE_PROVIDER_SITE_OTHER): Payer: Self-pay | Admitting: Nurse Practitioner

## 2022-06-29 ENCOUNTER — Encounter: Payer: Self-pay | Admitting: Internal Medicine

## 2022-06-29 ENCOUNTER — Ambulatory Visit (INDEPENDENT_AMBULATORY_CARE_PROVIDER_SITE_OTHER): Payer: Medicare Other | Admitting: Nurse Practitioner

## 2022-06-29 ENCOUNTER — Encounter (INDEPENDENT_AMBULATORY_CARE_PROVIDER_SITE_OTHER): Payer: Self-pay

## 2022-06-29 ENCOUNTER — Encounter (INDEPENDENT_AMBULATORY_CARE_PROVIDER_SITE_OTHER): Payer: PRIVATE HEALTH INSURANCE

## 2022-06-29 VITALS — BP 134/68 | HR 86 | Resp 17 | Ht 66.0 in | Wt 170.2 lb

## 2022-06-29 DIAGNOSIS — L97201 Non-pressure chronic ulcer of unspecified calf limited to breakdown of skin: Secondary | ICD-10-CM

## 2022-06-29 NOTE — Progress Notes (Signed)
History of Present Illness  There is no documented history at this time  Assessments & Plan   There are no diagnoses linked to this encounter.    Additional instructions  Subjective:  Patient presents with venous ulcer of the Bilateral lower extremity.    Procedure:  3 layer unna wrap was placed Bilateral lower extremity.   Plan:   Follow up in one week.  

## 2022-07-06 ENCOUNTER — Ambulatory Visit (INDEPENDENT_AMBULATORY_CARE_PROVIDER_SITE_OTHER): Payer: Medicare Other | Admitting: Nurse Practitioner

## 2022-07-09 ENCOUNTER — Encounter (INDEPENDENT_AMBULATORY_CARE_PROVIDER_SITE_OTHER): Payer: Self-pay | Admitting: Nurse Practitioner

## 2022-07-18 ENCOUNTER — Ambulatory Visit (INDEPENDENT_AMBULATORY_CARE_PROVIDER_SITE_OTHER): Payer: Medicare Other | Admitting: Nurse Practitioner

## 2022-07-18 DIAGNOSIS — I10 Essential (primary) hypertension: Secondary | ICD-10-CM

## 2022-07-18 DIAGNOSIS — N1832 Chronic kidney disease, stage 3b: Secondary | ICD-10-CM

## 2022-07-18 DIAGNOSIS — I350 Nonrheumatic aortic (valve) stenosis: Secondary | ICD-10-CM

## 2022-07-18 DIAGNOSIS — L97201 Non-pressure chronic ulcer of unspecified calf limited to breakdown of skin: Secondary | ICD-10-CM

## 2022-07-22 ENCOUNTER — Encounter (INDEPENDENT_AMBULATORY_CARE_PROVIDER_SITE_OTHER): Payer: Self-pay | Admitting: Nurse Practitioner

## 2022-07-22 NOTE — Progress Notes (Signed)
Subjective:    Patient ID: Bill Peterson, male    DOB: 09-03-1934, 87 y.o.   MRN: [id number] No chief complaint on file.   Bill Peterson is an 87 year old male who returns today for follow-up evaluation of his lower extremity edema.  He continues to have significant swelling as well as some venous wounds.  He has recently found that he has had some valvular disease and he also notes that he is got some chronic kidney disease.  These are likely causing some worsening of his edema.  He denies any fevers or chills.  The patient also has significant dryness in his lower extremities.    Review of Systems  Skin:  Positive for wound.  All other systems reviewed and are negative.      Objective:   Physical Exam Vitals reviewed.  HENT:     Head: Normocephalic.  Cardiovascular:     Rate and Rhythm: Normal rate.  Pulmonary:     Effort: Pulmonary effort is normal.  Musculoskeletal:     Right lower leg: Edema present.     Left lower leg: Edema present.  Skin:    General: Skin is warm and dry.  Neurological:     Mental Status: He is alert and oriented to person, place, and time.  Psychiatric:        Mood and Affect: Mood normal.        Behavior: Behavior normal.        Thought Content: Thought content normal.        Judgment: Judgment normal.     There were no vitals taken for this visit.  Past Medical History:  Diagnosis Date   BPH (benign prostatic hyperplasia)    CHF (congestive heart failure) (HCC)    Diabetes mellitus without complication ([place])    Hyperlipidemia    Hypertension     Social History   Socioeconomic History   Marital status: Married    Spouse name: Not on file   Number of children: Not on file   Years of education: Not on file   Highest education level: Not on file  Occupational History   Occupation: retired  Tobacco Use   Smoking status: Never   Smokeless tobacco: Never  Vaping Use   Vaping Use: Never used  Substance and Sexual Activity    Alcohol use: No   Drug use: No   Sexual activity: Yes  Other Topics Concern   Not on file  Social History Narrative   Lives at home with his wife. Active at baseline   Social Determinants of Health   Financial Resource Strain: Low Risk  (10/15/2018)   Overall Financial Resource Strain (CARDIA)    Difficulty of Paying Living Expenses: Not hard at all  Food Insecurity: No Food Insecurity (09/14/2017)   Hunger Vital Sign    Worried About Running Out of Food in the Last Year: Never true    Ran Out of Food in the Last Year: Never true  Transportation Needs: No Transportation Needs (09/14/2017)   PRAPARE - Hydrologist (Medical): No    Lack of Transportation (Non-Medical): No  Physical Activity: Unknown (10/15/2018)   Exercise Vital Sign    Days of Exercise per Week: 3 days    Minutes of Exercise per Session: Not on file  Stress: No Stress Concern Present (10/15/2018)   [place]    Feeling of Stress : Only a little  Social Connections: Unknown (10/15/2018)   Social Connection and Isolation Panel [NHANES]    Frequency of Communication with Friends and Family: Not on file    Frequency of Social Gatherings with Friends and Family: More than three times a week    Attends Religious Services: Not on file    Active Member of Clubs or Organizations: Not on file    Attends Archivist Meetings: Not on file    Marital Status: Not on file  Intimate Partner Violence: Not At Risk (09/14/2017)   Humiliation, Afraid, Rape, and Kick questionnaire    Fear of Current or Ex-Partner: No    Emotionally Abused: No    Physically Abused: No    Sexually Abused: No    Past Surgical History:  Procedure Laterality Date   BACK SURGERY     BIOPSY PROSTATE  08/2018   CARDIAC SURGERY     STOMACH SURGERY      Family History  Problem Relation Age of Onset   Heart disease Mother    Diabetes Father     Prostate cancer Neg Hx    Kidney cancer Neg Hx    Bladder Cancer Neg Hx     Allergies  Allergen Reactions   Heparin     REACTION: Unspecified   Repaglinide Other (See Comments)    Increased appetite Increased appetite Increased appetite        Latest Ref Rng & Units 09/06/2017    6:45 AM 09/05/2017    4:14 PM 08/18/2017    5:00 AM  CBC  WBC 3.8 - 10.6 K/uL 4.8  10.1  5.5   Hemoglobin 13.0 - 18.0 g/dL 10.5  12.3  10.7   Hematocrit 40.0 - 52.0 % 31.2  36.9  31.4   Platelets 150 - 440 K/uL 193  231  181       CMP     Component Value Date/Time   NA 141 09/06/2017 0645   K 3.5 09/06/2017 0645   CL 104 09/06/2017 0645   CO2 28 09/06/2017 0645   GLUCOSE 137 (H) 09/06/2017 0645   BUN 23 (H) 09/06/2017 0645   CREATININE 1.76 (H) 09/06/2017 0645   CALCIUM 8.6 (L) 09/06/2017 0645   PROT 5.7 (L) 11/04/2007 0400   ALBUMIN 3.0 (L) 08/18/2017 0500   AST 30 11/04/2007 0400   ALT 23 11/04/2007 0400   ALKPHOS 44 11/04/2007 0400   BILITOT 0.3 11/04/2007 0400   GFRNONAA 35 (L) 09/06/2017 0645   GFRAA 40 (L) 09/06/2017 0645     No results found.     Assessment & Plan:   1. Skin ulcer of calf, limited to breakdown of skin, unspecified laterality ([place]) The patient continues to have wounds bilaterally although the wound wound is largely related to a skin tear due to wearing the Unna wraps for an extended timeframe.  The patient will continue with the rest to be changed weekly.  He will also continue with elevation and activity.  He is advised to continue to follow with his cardiologist as well as nephrologist in regards to his valvular disease and chronic kidney disease as these are also likely playing a factor in his continued swelling.  2. Aortic valve stenosis, etiology of cardiac valve disease unspecified This will also complicate and worsen his lower extremity edema  3. Stage 3b chronic kidney disease ([place]) Plays a factor versus lower extremity edema  4. Primary  hypertension Continue antihypertensive medications as already ordered, these medications have been reviewed and there  are no changes at this time.   Current Outpatient Medications on File Prior to Visit  Medication Sig Dispense Refill   aspirin 325 MG tablet Take 325 mg by mouth daily.     atorvastatin (LIPITOR) 20 MG tablet Take 20 mg by mouth daily.     calcitRIOL (ROCALTROL) 0.25 MCG capsule Take by mouth.     carvedilol (COREG) 12.5 MG tablet Take 6.25 mg by mouth 2 (two) times daily with a meal.     Cholecalciferol (VITAMIN D3) 10 MCG (400 UNIT) tablet Take 400 Units by mouth daily.     CINNAMON PO Take 1,000 tablets by mouth daily at 6 (six) AM.     Ferrous Sulfate (IRON) 325 (65 Fe) MG TABS Take 1 tablet by mouth daily.     Garlic 2585 MG CAPS Take 1,000 mg by mouth daily at 6 (six) AM.     Ginger, Zingiber officinalis, (GINGER ROOT) 550 MG CAPS Take 1 capsule by mouth daily at 6 (six) AM.     lisinopril (PRINIVIL,ZESTRIL) 20 MG tablet Take 10 mg by mouth daily.     magnesium oxide (MAG-OX) 400 MG tablet Take 500 mg by mouth daily.     Misc Natural Products (APPLE CIDER VINEGAR DIET PO) Take 450 mg by mouth daily.     Multiple Vitamin (MULTIVITAMIN) capsule Take 1 capsule by mouth daily.     omeprazole (PRILOSEC) 20 MG capsule Take 20 mg by mouth daily.     Potassium 99 MG TABS Take 99 mg by mouth daily as needed.     tamsulosin (FLOMAX) 0.4 MG CAPS capsule Take 1 capsule (0.4 mg total) by mouth 2 (two) times daily. 180 capsule 3   torsemide (DEMADEX) 20 MG tablet Take 20 mg by mouth daily.     Turmeric 500 MG CAPS Take 500 mg by mouth daily.     vitamin E 400 UNIT capsule Take 400 Units by mouth daily.     No current facility-administered medications on file prior to visit.    There are no Patient Instructions on file for this visit. No follow-ups on file.   Kris Hartmann, NP

## 2022-07-25 ENCOUNTER — Inpatient Hospital Stay: Payer: Medicare Other | Attending: Internal Medicine | Admitting: Internal Medicine

## 2022-07-25 ENCOUNTER — Encounter: Payer: Self-pay | Admitting: Internal Medicine

## 2022-07-25 ENCOUNTER — Inpatient Hospital Stay: Payer: Medicare Other

## 2022-07-25 VITALS — BP 138/66 | HR 84 | Resp 18 | Wt 174.8 lb

## 2022-07-25 DIAGNOSIS — I130 Hypertensive heart and chronic kidney disease with heart failure and stage 1 through stage 4 chronic kidney disease, or unspecified chronic kidney disease: Secondary | ICD-10-CM | POA: Diagnosis present

## 2022-07-25 DIAGNOSIS — I509 Heart failure, unspecified: Secondary | ICD-10-CM | POA: Diagnosis not present

## 2022-07-25 DIAGNOSIS — Z7982 Long term (current) use of aspirin: Secondary | ICD-10-CM | POA: Insufficient documentation

## 2022-07-25 DIAGNOSIS — N400 Enlarged prostate without lower urinary tract symptoms: Secondary | ICD-10-CM | POA: Diagnosis not present

## 2022-07-25 DIAGNOSIS — D649 Anemia, unspecified: Secondary | ICD-10-CM

## 2022-07-25 DIAGNOSIS — E1122 Type 2 diabetes mellitus with diabetic chronic kidney disease: Secondary | ICD-10-CM | POA: Insufficient documentation

## 2022-07-25 DIAGNOSIS — N1832 Chronic kidney disease, stage 3b: Secondary | ICD-10-CM

## 2022-07-25 DIAGNOSIS — N184 Chronic kidney disease, stage 4 (severe): Secondary | ICD-10-CM | POA: Insufficient documentation

## 2022-07-25 DIAGNOSIS — E785 Hyperlipidemia, unspecified: Secondary | ICD-10-CM | POA: Insufficient documentation

## 2022-07-25 DIAGNOSIS — Z79899 Other long term (current) drug therapy: Secondary | ICD-10-CM | POA: Insufficient documentation

## 2022-07-25 LAB — IRON AND TIBC
Iron: 36 ug/dL — ABNORMAL LOW (ref 45–182)
Saturation Ratios: 13 % — ABNORMAL LOW (ref 17.9–39.5)
TIBC: 288 ug/dL (ref 250–450)
UIBC: 252 ug/dL

## 2022-07-25 LAB — FOLATE: Folate: 31 ng/mL (ref >5.9)

## 2022-07-25 LAB — FERRITIN: Ferritin: 37 ng/mL (ref 24–336)

## 2022-07-25 LAB — VITAMIN B12: Vitamin B-12: 563 pg/mL (ref 180–914)

## 2022-07-25 NOTE — Progress Notes (Signed)
Patient here today for initial evaluation regarding anemia.  

## 2022-07-25 NOTE — Progress Notes (Signed)
[place]  Telephone:(336) [phone] Fax:(336) [phone]  ID: Bill Peterson OB: 06/23/1935  MR#: [id number]  ERX#:[id number]  Patient Care Team: Kirk Ruths, MD as PCP - General (Internal Medicine)  REFERRING PROVIDER: Dr. Holley Raring  REASON FOR REFERRAL: anemia   HPI: Bill Peterson is a 87 y.o. male with past medical history of hyperlipidemia, hypertension, diabetes, CHF and CKD stage IV was referred to hematology for further management of anemia.  Patient follows with Dr. Holley Raring for CKD.  Labs from 07/03/2022 showed hemoglobin 7.8, WBC 4.6 and platelet 192.  No recent iron studies.  Patient had a follow-up on Thanksgiving day facing forward.  He is working to get a Pharmacist, community appointment.  REVIEW OF SYSTEMS:   ROS  As per HPI. Otherwise, a complete review of systems is negative.  PAST MEDICAL HISTORY: Past Medical History:  Diagnosis Date   BPH (benign prostatic hyperplasia)    CHF (congestive heart failure) (HCC)    Diabetes mellitus without complication ([place])    Hyperlipidemia    Hypertension     PAST SURGICAL HISTORY: Past Surgical History:  Procedure Laterality Date   BACK SURGERY     BIOPSY PROSTATE  08/2018   CARDIAC SURGERY     STOMACH SURGERY      FAMILY HISTORY: Family History  Problem Relation Age of Onset   Heart disease Mother    Diabetes Father    Prostate cancer Neg Hx    Kidney cancer Neg Hx    Bladder Cancer Neg Hx     HEALTH MAINTENANCE: Social History   Tobacco Use   Smoking status: Never   Smokeless tobacco: Never  Vaping Use   Vaping Use: Never used  Substance Use Topics   Alcohol use: No   Drug use: No     Allergies  Allergen Reactions   Heparin     REACTION: Unspecified   Repaglinide Other (See Comments)    Increased appetite Increased appetite Increased appetite     Current Outpatient Medications  Medication Sig Dispense Refill   aspirin 325 MG tablet Take 325 mg by mouth daily.      atorvastatin (LIPITOR) 20 MG tablet Take 20 mg by mouth daily.     calcitRIOL (ROCALTROL) 0.25 MCG capsule Take by mouth.     carvedilol (COREG) 12.5 MG tablet Take 6.25 mg by mouth 2 (two) times daily with a meal.     Cholecalciferol (VITAMIN D3) 10 MCG (400 UNIT) tablet Take 400 Units by mouth daily.     CINNAMON PO Take 1,000 tablets by mouth daily at 6 (six) AM.     Ferrous Sulfate (IRON) 325 (65 Fe) MG TABS Take 1 tablet by mouth daily.     Garlic 9509 MG CAPS Take 1,000 mg by mouth daily at 6 (six) AM.     Ginger, Zingiber officinalis, (GINGER ROOT) 550 MG CAPS Take 1 capsule by mouth daily at 6 (six) AM.     lisinopril (PRINIVIL,ZESTRIL) 20 MG tablet Take 10 mg by mouth daily.     magnesium oxide (MAG-OX) 400 MG tablet Take 500 mg by mouth daily.     Misc Natural Products (APPLE CIDER VINEGAR DIET PO) Take 450 mg by mouth daily.     Multiple Vitamin (MULTIVITAMIN) capsule Take 1 capsule by mouth daily.     omeprazole (PRILOSEC) 20 MG capsule Take 20 mg by mouth daily.     Potassium 99 MG TABS Take 99 mg by mouth daily as needed.  tamsulosin (FLOMAX) 0.4 MG CAPS capsule Take 1 capsule (0.4 mg total) by mouth 2 (two) times daily. 180 capsule 3   torsemide (DEMADEX) 20 MG tablet Take 20 mg by mouth daily.     Turmeric 500 MG CAPS Take 500 mg by mouth daily.     vitamin E 400 UNIT capsule Take 400 Units by mouth daily.     No current facility-administered medications for this visit.    OBJECTIVE: Vitals:   07/25/22 1135  BP: 138/66  Pulse: 84  Resp: 18  SpO2: 100%     Body mass index is 28.21 kg/m.      General: Well-developed, well-nourished, no acute distress. Eyes: Pink conjunctiva, anicteric sclera. HEENT: Normocephalic, moist mucous membranes, clear oropharnyx. Lungs: Clear to auscultation bilaterally. Heart: Regular rate and rhythm. No rubs, murmurs, or gallops. Abdomen: Soft, nontender, nondistended. No organomegaly noted, normoactive bowel sounds. Musculoskeletal:  No edema, cyanosis, or clubbing. Neuro: Alert, answering all questions appropriately. Cranial nerves grossly intact. Skin: No rashes or petechiae noted. Psych: Normal affect. Lymphatics: No cervical, calvicular, axillary or inguinal LAD.   LAB RESULTS:  Lab Results  Component Value Date   NA 141 09/06/2017   K 3.5 09/06/2017   CL 104 09/06/2017   CO2 28 09/06/2017   GLUCOSE 137 (H) 09/06/2017   BUN 23 (H) 09/06/2017   CREATININE 1.76 (H) 09/06/2017   CALCIUM 8.6 (L) 09/06/2017   PROT 5.7 (L) 11/04/2007   ALBUMIN 3.0 (L) 08/18/2017   AST 30 11/04/2007   ALT 23 11/04/2007   ALKPHOS 44 11/04/2007   BILITOT 0.3 11/04/2007   GFRNONAA 35 (L) 09/06/2017   GFRAA 40 (L) 09/06/2017    Lab Results  Component Value Date   WBC 4.8 09/06/2017   NEUTROABS 3.9 08/18/2017   HGB 10.5 (L) 09/06/2017   HCT 31.2 (L) 09/06/2017   MCV 92.6 09/06/2017   PLT 193 09/06/2017    No results found for: "TIBC", "FERRITIN", "IRONPCTSAT"   STUDIES: No results found.  ASSESSMENT AND PLAN:   Bill Peterson is a 87 y.o. male with pmh of hyperlipidemia, hypertension, diabetes, CHF and CKD stage IV was referred to hematology for further management of anemia.  #Normocytic anemia  #CKD Stage IV -Progressive in nature.  Hemoglobin has been around 7. -Labs as below. -Discussed about reasons for anemia such as nutritional deficiency and chronic kidney disease.  He will benefit from Aranesp injections.  I will start with 100 mcg every 2 weeks.  Side effects such as not limited to hypertension, increased risk of blood clots, stroke or cardiovascular issues with hemoglobin >11 were discussed.  Patient is taking oral iron which is causing GI upset.  I discussed about IV iron if iron panel shows iron deficiency.  Patient declined.  Orders Placed This Encounter  Procedures   Ferritin   Vitamin B12   Folate   Erythropoietin   Iron and TIBC   RTC in 1 week for MD visit to discuss labs and  Aranesp.  Patient expressed understanding and was in agreement with this plan. He also understands that He can call clinic at any time with any questions, concerns, or complaints.   I spent a total of 45 minutes reviewing chart data, face-to-face evaluation with the patient, counseling and coordination of care as detailed above.  Jane Canary, MD   07/25/2022 11:46 AM

## 2022-07-26 LAB — ERYTHROPOIETIN: Erythropoietin: 41.5 m[IU]/mL — ABNORMAL HIGH (ref 2.6–18.5)

## 2022-07-27 ENCOUNTER — Encounter (INDEPENDENT_AMBULATORY_CARE_PROVIDER_SITE_OTHER): Payer: Self-pay

## 2022-07-27 ENCOUNTER — Ambulatory Visit (INDEPENDENT_AMBULATORY_CARE_PROVIDER_SITE_OTHER): Payer: Medicare Other | Admitting: Nurse Practitioner

## 2022-07-27 VITALS — BP 127/70 | HR 85 | Resp 16 | Ht 66.0 in | Wt 174.0 lb

## 2022-07-27 DIAGNOSIS — L97201 Non-pressure chronic ulcer of unspecified calf limited to breakdown of skin: Secondary | ICD-10-CM

## 2022-07-27 NOTE — Progress Notes (Signed)
History of Present Illness  There is no documented history at this time  Assessments & Plan   There are no diagnoses linked to this encounter.    Additional instructions  Subjective:  Patient presents with venous ulcer of the Bilateral lower extremity.    Procedure:  3 layer unna wrap was placed Bilateral lower extremity.   Plan:   Follow up in one week.  

## 2022-07-30 ENCOUNTER — Encounter (INDEPENDENT_AMBULATORY_CARE_PROVIDER_SITE_OTHER): Payer: Self-pay | Admitting: Nurse Practitioner

## 2022-08-01 ENCOUNTER — Encounter: Payer: Self-pay | Admitting: Internal Medicine

## 2022-08-01 ENCOUNTER — Inpatient Hospital Stay: Payer: Medicare Other | Attending: Internal Medicine | Admitting: Internal Medicine

## 2022-08-01 ENCOUNTER — Inpatient Hospital Stay: Payer: Medicare Other

## 2022-08-01 VITALS — BP 127/65 | HR 80 | Temp 98.6°F | Resp 20 | Wt 176.6 lb

## 2022-08-01 DIAGNOSIS — Z79899 Other long term (current) drug therapy: Secondary | ICD-10-CM | POA: Insufficient documentation

## 2022-08-01 DIAGNOSIS — E611 Iron deficiency: Secondary | ICD-10-CM | POA: Insufficient documentation

## 2022-08-01 DIAGNOSIS — N1832 Chronic kidney disease, stage 3b: Secondary | ICD-10-CM

## 2022-08-01 DIAGNOSIS — D631 Anemia in chronic kidney disease: Secondary | ICD-10-CM | POA: Insufficient documentation

## 2022-08-01 DIAGNOSIS — I5030 Unspecified diastolic (congestive) heart failure: Secondary | ICD-10-CM | POA: Diagnosis not present

## 2022-08-01 DIAGNOSIS — E1122 Type 2 diabetes mellitus with diabetic chronic kidney disease: Secondary | ICD-10-CM | POA: Insufficient documentation

## 2022-08-01 DIAGNOSIS — Z7982 Long term (current) use of aspirin: Secondary | ICD-10-CM | POA: Diagnosis not present

## 2022-08-01 DIAGNOSIS — E785 Hyperlipidemia, unspecified: Secondary | ICD-10-CM | POA: Insufficient documentation

## 2022-08-01 DIAGNOSIS — N184 Chronic kidney disease, stage 4 (severe): Secondary | ICD-10-CM | POA: Insufficient documentation

## 2022-08-01 DIAGNOSIS — I130 Hypertensive heart and chronic kidney disease with heart failure and stage 1 through stage 4 chronic kidney disease, or unspecified chronic kidney disease: Secondary | ICD-10-CM | POA: Insufficient documentation

## 2022-08-01 DIAGNOSIS — D649 Anemia, unspecified: Secondary | ICD-10-CM

## 2022-08-01 MED ORDER — DARBEPOETIN ALFA 100 MCG/0.5ML IJ SOSY
100.0000 ug | PREFILLED_SYRINGE | Freq: Once | INTRAMUSCULAR | Status: AC
  Administered 2022-08-01: 100 ug via SUBCUTANEOUS
  Filled 2022-08-01: qty 0.5

## 2022-08-03 ENCOUNTER — Ambulatory Visit (INDEPENDENT_AMBULATORY_CARE_PROVIDER_SITE_OTHER): Payer: Medicare Other | Admitting: Nurse Practitioner

## 2022-08-03 ENCOUNTER — Encounter: Payer: Self-pay | Admitting: Internal Medicine

## 2022-08-03 VITALS — BP 121/70 | HR 88 | Resp 16 | Ht 66.0 in | Wt 178.0 lb

## 2022-08-03 DIAGNOSIS — L97201 Non-pressure chronic ulcer of unspecified calf limited to breakdown of skin: Secondary | ICD-10-CM | POA: Diagnosis not present

## 2022-08-03 DIAGNOSIS — E611 Iron deficiency: Secondary | ICD-10-CM | POA: Insufficient documentation

## 2022-08-03 NOTE — Progress Notes (Signed)
History of Present Illness  There is no documented history at this time  Assessments & Plan   There are no diagnoses linked to this encounter.    Additional instructions  Subjective:  Patient presents with venous ulcer of the Bilateral lower extremity.    Procedure:  3 layer unna wrap was placed Bilateral lower extremity.   Plan:   Follow up in one week.  

## 2022-08-03 NOTE — Progress Notes (Signed)
[place]  Telephone:(336) [phone] [phone]) [phone]  ID: DAELEN BELVEDERE OB: [birth year]  MR#: [id number]  JJH#:[id number]  Patient Care Team: Kirk Ruths, MD as PCP - General (Internal Medicine)  REFERRING PROVIDER: Dr. Holley Raring  REASON FOR REFERRAL: anemia   HPI: Bill Peterson is a 87 y.o. male with past medical history of hyperlipidemia, hypertension, diabetes, CHF and CKD stage IV was referred to hematology for further management of anemia.  Patient follows with Dr. Holley Raring for CKD.  Labs from 07/03/2022 showed hemoglobin 7.8, WBC 4.6 and platelet 192.  No recent iron studies.  Takes oral iron daily. Patient had a follow-up on Thanksgiving day facing forward.  He is working to get a Pharmacist, community appointment.  INTERVAL HISTORY-  Patient seen today to discuss labs and starting Aranesp. He reports feeling well overall.  He has fatigue.  Otherwise denies any shortness of breath, chest pain, bleeding, cough.  REVIEW OF SYSTEMS:   Review of Systems  Constitutional:  Positive for malaise/fatigue.  Respiratory:  Negative for cough and shortness of breath.   Cardiovascular:  Negative for chest pain and palpitations.  Gastrointestinal:  Negative for abdominal pain, nausea and vomiting.  Musculoskeletal:  Positive for falls.    As per HPI. Otherwise, a complete review of systems is negative.  PAST MEDICAL HISTORY: Past Medical History:  Diagnosis Date   BPH (benign prostatic hyperplasia)    CHF (congestive heart failure) (HCC)    Diabetes mellitus without complication ([place])    Hyperlipidemia    Hypertension     PAST SURGICAL HISTORY: Past Surgical History:  Procedure Laterality Date   BACK SURGERY     BIOPSY PROSTATE  08/2018   CARDIAC SURGERY     STOMACH SURGERY      FAMILY HISTORY: Family History  Problem Relation Age of Onset   Heart disease Mother    Diabetes Father    Prostate cancer Neg Hx    Kidney cancer Neg Hx    Bladder Cancer Neg Hx      HEALTH MAINTENANCE: Social History   Tobacco Use   Smoking status: Never   Smokeless tobacco: Never  Vaping Use   Vaping Use: Never used  Substance Use Topics   Alcohol use: No   Drug use: No     Allergies  Allergen Reactions   Heparin     REACTION: Unspecified   Repaglinide Other (See Comments)    Increased appetite Increased appetite Increased appetite     Current Outpatient Medications  Medication Sig Dispense Refill   aspirin 325 MG tablet Take 325 mg by mouth daily.     atorvastatin (LIPITOR) 20 MG tablet Take 20 mg by mouth daily.     calcitRIOL (ROCALTROL) 0.25 MCG capsule Take by mouth.     carvedilol (COREG) 12.5 MG tablet Take 6.25 mg by mouth 2 (two) times daily with a meal.     Cholecalciferol (VITAMIN D3) 10 MCG (400 UNIT) tablet Take 400 Units by mouth daily.     CINNAMON PO Take 1,000 tablets by mouth daily at 6 (six) AM.     Ferrous Sulfate (IRON) 325 (65 Fe) MG TABS Take 1 tablet by mouth daily.     Garlic 8185 MG CAPS Take 1,000 mg by mouth daily at 6 (six) AM.     Ginger, Zingiber officinalis, (GINGER ROOT) 550 MG CAPS Take 1 capsule by mouth daily at 6 (six) AM.     lisinopril (PRINIVIL,ZESTRIL) 20 MG tablet Take 10 mg  by mouth daily.     magnesium oxide (MAG-OX) 400 MG tablet Take 500 mg by mouth daily.     Misc Natural Products (APPLE CIDER VINEGAR DIET PO) Take 450 mg by mouth daily.     Multiple Vitamin (MULTIVITAMIN) capsule Take 1 capsule by mouth daily.     omeprazole (PRILOSEC) 20 MG capsule Take 20 mg by mouth daily.     Potassium 99 MG TABS Take 99 mg by mouth daily as needed.     tamsulosin (FLOMAX) 0.4 MG CAPS capsule Take 1 capsule (0.4 mg total) by mouth 2 (two) times daily. 180 capsule 3   torsemide (DEMADEX) 20 MG tablet Take 20 mg by mouth daily.     Turmeric 500 MG CAPS Take 500 mg by mouth daily.     vitamin E 400 UNIT capsule Take 400 Units by mouth daily.     No current facility-administered medications for this visit.     OBJECTIVE: Vitals:   08/01/22 1057  BP: 127/65  Pulse: 80  Resp: 20  Temp: 98.6 F (37 C)  SpO2: 100%     Body mass index is 28.5 kg/m.      General: Well-developed, well-nourished, no acute distress. Eyes: Pink conjunctiva, anicteric sclera. HEENT: Normocephalic, moist mucous membranes, clear oropharnyx. Lungs: Clear to auscultation bilaterally. Heart: Regular rate and rhythm. No rubs, murmurs, or gallops. Abdomen: Soft, nontender, nondistended. No organomegaly noted, normoactive bowel sounds. Musculoskeletal: No edema, cyanosis, or clubbing. Neuro: Alert, answering all questions appropriately. Cranial nerves grossly intact. Skin: No rashes or petechiae noted. Psych: Normal affect. Lymphatics: No cervical, calvicular, axillary or inguinal LAD.   LAB RESULTS:  Lab Results  Component Value Date   NA 141 09/06/2017   K 3.5 09/06/2017   CL 104 09/06/2017   CO2 28 09/06/2017   GLUCOSE 137 (H) 09/06/2017   BUN 23 (H) 09/06/2017   CREATININE 1.76 (H) 09/06/2017   CALCIUM 8.6 (L) 09/06/2017   PROT 5.7 (L) 11/04/2007   ALBUMIN 3.0 (L) 08/18/2017   AST 30 11/04/2007   ALT 23 11/04/2007   ALKPHOS 44 11/04/2007   BILITOT 0.3 11/04/2007   GFRNONAA 35 (L) 09/06/2017   GFRAA 40 (L) 09/06/2017    Lab Results  Component Value Date   WBC 4.8 09/06/2017   NEUTROABS 3.9 08/18/2017   HGB 10.5 (L) 09/06/2017   HCT 31.2 (L) 09/06/2017   MCV 92.6 09/06/2017   PLT 193 09/06/2017    Lab Results  Component Value Date   TIBC 288 07/25/2022   FERRITIN 37 07/25/2022   IRONPCTSAT 13 (L) 07/25/2022     STUDIES: No results found.  ASSESSMENT AND PLAN:   Bill Peterson is a 87 y.o. male with pmh of hyperlipidemia, hypertension, diabetes, CHF and CKD stage IV was referred to hematology for further management of anemia.  #Normocytic anemia  #CKD Stage IV -Anemia is likely secondary to chronic kidney disease.  B12 and folate normal.  Ferritin 36, saturation 13%.  Patient is  taking oral iron daily.  Discussed about getting one IV Feraheme to maintain saturation around 20%.  Patient would like to proceed.  -Last hemoglobin was 7.9.  Will proceed with Aranesp 100 mcg every 2 weeks.  If hemoglobin does not increase by 1 g/dL in 4 weeks will consider increasing the dose.  -I will follow-up with him again in 2 weeks.  He is planned to get labs that morning for his nephrologist.  I will use the same labs.  No  orders of the defined types were placed in this encounter.  RTC in 2 weeks for MD visit, Aranesp, IV Feraheme.  Patient expressed understanding and was in agreement with this plan. He also understands that He can call clinic at any time with any questions, concerns, or complaints.   I spent a total of 30 minutes reviewing chart data, face-to-face evaluation with the patient, counseling and coordination of care as detailed above.  Jane Canary, MD   08/03/2022 8:59 AM

## 2022-08-06 ENCOUNTER — Encounter (INDEPENDENT_AMBULATORY_CARE_PROVIDER_SITE_OTHER): Payer: Self-pay | Admitting: Nurse Practitioner

## 2022-08-10 ENCOUNTER — Ambulatory Visit (INDEPENDENT_AMBULATORY_CARE_PROVIDER_SITE_OTHER): Payer: Medicare Other | Admitting: Nurse Practitioner

## 2022-08-10 VITALS — BP 124/66 | HR 80 | Resp 16 | Ht 66.0 in | Wt 174.0 lb

## 2022-08-10 DIAGNOSIS — L97201 Non-pressure chronic ulcer of unspecified calf limited to breakdown of skin: Secondary | ICD-10-CM

## 2022-08-10 NOTE — Progress Notes (Signed)
History of Present Illness  There is no documented history at this time  Assessments & Plan   There are no diagnoses linked to this encounter.    Additional instructions  Subjective:  Patient presents with venous ulcer of the Bilateral lower extremity.    Procedure:  3 layer unna wrap was placed Bilateral lower extremity.   Plan:   Follow up in one week.  

## 2022-08-14 MED FILL — Ferumoxytol Inj 510 MG/17ML (30 MG/ML) (Elemental Fe): INTRAVENOUS | Qty: 17 | Status: AC

## 2022-08-15 ENCOUNTER — Inpatient Hospital Stay: Payer: Medicare Other

## 2022-08-15 ENCOUNTER — Encounter (INDEPENDENT_AMBULATORY_CARE_PROVIDER_SITE_OTHER): Payer: Self-pay | Admitting: Nurse Practitioner

## 2022-08-15 ENCOUNTER — Ambulatory Visit: Payer: Medicare Other | Admitting: Internal Medicine

## 2022-08-15 ENCOUNTER — Encounter: Payer: Self-pay | Admitting: Internal Medicine

## 2022-08-15 ENCOUNTER — Ambulatory Visit: Payer: Medicare Other

## 2022-08-15 ENCOUNTER — Inpatient Hospital Stay (HOSPITAL_BASED_OUTPATIENT_CLINIC_OR_DEPARTMENT_OTHER): Payer: Medicare Other | Admitting: Internal Medicine

## 2022-08-15 VITALS — BP 133/74 | HR 74 | Temp 97.8°F | Resp 18 | Wt 179.2 lb

## 2022-08-15 VITALS — BP 135/68 | HR 72 | Resp 16

## 2022-08-15 DIAGNOSIS — D631 Anemia in chronic kidney disease: Secondary | ICD-10-CM

## 2022-08-15 DIAGNOSIS — D649 Anemia, unspecified: Secondary | ICD-10-CM

## 2022-08-15 DIAGNOSIS — E611 Iron deficiency: Secondary | ICD-10-CM

## 2022-08-15 DIAGNOSIS — N1832 Chronic kidney disease, stage 3b: Secondary | ICD-10-CM | POA: Diagnosis not present

## 2022-08-15 DIAGNOSIS — N184 Chronic kidney disease, stage 4 (severe): Secondary | ICD-10-CM | POA: Diagnosis not present

## 2022-08-15 MED ORDER — SODIUM CHLORIDE 0.9 % IV SOLN
510.0000 mg | Freq: Once | INTRAVENOUS | Status: AC
  Administered 2022-08-15: 510 mg via INTRAVENOUS
  Filled 2022-08-15: qty 510

## 2022-08-15 MED ORDER — SODIUM CHLORIDE 0.9 % IV SOLN
Freq: Once | INTRAVENOUS | Status: AC
  Filled 2022-08-15: qty 250

## 2022-08-15 NOTE — Progress Notes (Signed)
Patient here today for follow up regarding anemia. Patient denies any concerns today. 

## 2022-08-15 NOTE — Progress Notes (Signed)
[place]  Telephone:(336) [phone] Fax:(336) [phone]  ID: Bill Peterson OB: 1935-01-04  MR#: [id number]  GYK#:[id number]  Patient Care Team: Kirk Ruths, MD as PCP - General (Internal Medicine)  REFERRING PROVIDER: Dr. Holley Raring  REASON FOR REFERRAL: anemia   HPI: Bill Peterson is a 87 y.o. male with past medical history of hyperlipidemia, hypertension, diabetes, CHF and CKD stage IV was referred to hematology for further management of anemia.  Patient follows with Dr. Holley Raring for CKD.  Labs from 07/03/2022 showed hemoglobin 7.8, WBC 4.6 and platelet 192.  No recent iron studies.  Takes oral iron daily. Patient had a follow-up on Thanksgiving day facing forward.  He is working to get a Pharmacist, community appointment.  INTERVAL HISTORY-  Patient was seen today for iron infusion.  We cannot do Aranesp today as it will not be covered by the insurance is the same day as iron. He reports feeling well overall.  Tolerating injections well. Otherwise denies any shortness of breath, chest pain, bleeding, cough.  REVIEW OF SYSTEMS:   Review of Systems  Constitutional:  Positive for malaise/fatigue.  Respiratory:  Negative for cough and shortness of breath.   Cardiovascular:  Negative for chest pain and palpitations.  Gastrointestinal:  Negative for abdominal pain, nausea and vomiting.  Musculoskeletal:  Positive for falls.    As per HPI. Otherwise, a complete review of systems is negative.  PAST MEDICAL HISTORY: Past Medical History:  Diagnosis Date   BPH (benign prostatic hyperplasia)    CHF (congestive heart failure) (HCC)    Diabetes mellitus without complication ([place])    Hyperlipidemia    Hypertension     PAST SURGICAL HISTORY: Past Surgical History:  Procedure Laterality Date   BACK SURGERY     BIOPSY PROSTATE  08/2018   CARDIAC SURGERY     STOMACH SURGERY      FAMILY HISTORY: Family History  Problem Relation Age of Onset   Heart disease Mother     Diabetes Father    Prostate cancer Neg Hx    Kidney cancer Neg Hx    Bladder Cancer Neg Hx     HEALTH MAINTENANCE: Social History   Tobacco Use   Smoking status: Never   Smokeless tobacco: Never  Vaping Use   Vaping Use: Never used  Substance Use Topics   Alcohol use: No   Drug use: No     Allergies  Allergen Reactions   Heparin     REACTION: Unspecified   Repaglinide Other (See Comments)    Increased appetite Increased appetite Increased appetite     Current Outpatient Medications  Medication Sig Dispense Refill   aspirin 325 MG tablet Take 325 mg by mouth daily.     atorvastatin (LIPITOR) 20 MG tablet Take 20 mg by mouth daily.     calcitRIOL (ROCALTROL) 0.25 MCG capsule Take by mouth.     carvedilol (COREG) 12.5 MG tablet Take 6.25 mg by mouth 2 (two) times daily with a meal.     Cholecalciferol (VITAMIN D3) 10 MCG (400 UNIT) tablet Take 400 Units by mouth daily.     CINNAMON PO Take 1,000 tablets by mouth daily at 6 (six) AM.     Ferrous Sulfate (IRON) 325 (65 Fe) MG TABS Take 1 tablet by mouth daily.     Garlic 7939 MG CAPS Take 1,000 mg by mouth daily at 6 (six) AM.     Ginger, Zingiber officinalis, (GINGER ROOT) 550 MG CAPS Take 1 capsule by  mouth daily at 6 (six) AM.     lisinopril (PRINIVIL,ZESTRIL) 20 MG tablet Take 10 mg by mouth daily.     magnesium oxide (MAG-OX) 400 MG tablet Take 500 mg by mouth daily.     Misc Natural Products (APPLE CIDER VINEGAR DIET PO) Take 450 mg by mouth daily.     Multiple Vitamin (MULTIVITAMIN) capsule Take 1 capsule by mouth daily.     omeprazole (PRILOSEC) 20 MG capsule Take 20 mg by mouth daily.     Potassium 99 MG TABS Take 99 mg by mouth daily as needed.     tamsulosin (FLOMAX) 0.4 MG CAPS capsule Take 1 capsule (0.4 mg total) by mouth 2 (two) times daily. 180 capsule 3   torsemide (DEMADEX) 20 MG tablet Take 20 mg by mouth daily.     Turmeric 500 MG CAPS Take 500 mg by mouth daily.     vitamin E 400 UNIT capsule Take  400 Units by mouth daily.     No current facility-administered medications for this visit.    OBJECTIVE: Vitals:   08/15/22 1114  BP: 133/74  Pulse: 74  Resp: 18  Temp: 97.8 F (36.6 C)     Body mass index is 28.92 kg/m.      General: Well-developed, well-nourished, no acute distress. Eyes: Pink conjunctiva, anicteric sclera. HEENT: Normocephalic, moist mucous membranes, clear oropharnyx. Lungs: Clear to auscultation bilaterally. Heart: Regular rate and rhythm. No rubs, murmurs, or gallops. Abdomen: Soft, nontender, nondistended. No organomegaly noted, normoactive bowel sounds. Musculoskeletal: No edema, cyanosis, or clubbing. Neuro: Alert, answering all questions appropriately. Cranial nerves grossly intact. Skin: No rashes or petechiae noted. Psych: Normal affect. Lymphatics: No cervical, calvicular, axillary or inguinal LAD.   LAB RESULTS:  Lab Results  Component Value Date   NA 141 09/06/2017   K 3.5 09/06/2017   CL 104 09/06/2017   CO2 28 09/06/2017   GLUCOSE 137 (H) 09/06/2017   BUN 23 (H) 09/06/2017   CREATININE 1.76 (H) 09/06/2017   CALCIUM 8.6 (L) 09/06/2017   PROT 5.7 (L) 11/04/2007   ALBUMIN 3.0 (L) 08/18/2017   AST 30 11/04/2007   ALT 23 11/04/2007   ALKPHOS 44 11/04/2007   BILITOT 0.3 11/04/2007   GFRNONAA 35 (L) 09/06/2017   GFRAA 40 (L) 09/06/2017    Lab Results  Component Value Date   WBC 4.8 09/06/2017   NEUTROABS 3.9 08/18/2017   HGB 10.5 (L) 09/06/2017   HCT 31.2 (L) 09/06/2017   MCV 92.6 09/06/2017   PLT 193 09/06/2017    Lab Results  Component Value Date   TIBC 288 07/25/2022   FERRITIN 37 07/25/2022   IRONPCTSAT 13 (L) 07/25/2022     STUDIES: No results found.  ASSESSMENT AND PLAN:   Bill Peterson is a 87 y.o. male with pmh of hyperlipidemia, hypertension, diabetes, CHF and CKD stage IV was referred to hematology for further management of anemia.  #Normocytic anemia  #CKD Stage IV -Anemia is likely secondary to chronic  kidney disease.  B12 and folate normal.  Ferritin 36, saturation 13%.  Patient is taking oral iron daily.  Will proceed with IV Feraheme x 1 to target saturation of 20%.  Cannot do Aranesp today since he is getting Feraheme.  Will reschedule next Thursday with CBC and Aranesp 100 mcg.  Is tolerating well.  # Hypertension # CAD status post PCI and stent # Moderate aortic stenosis # Diastolic CHF -Follows with cardiology Dr. Oralia Manis at [facility]. Stable.   #DM -  per primary  Orders Placed This Encounter  Procedures   CBC with Differential    RTC in 1 week for labs, aranesp  In 4 weeks for md visit, labs, aranesp   Patient expressed understanding and was in agreement with this plan. He also understands that He can call clinic at any time with any questions, concerns, or complaints.   I spent a total of 30 minutes reviewing chart data, face-to-face evaluation with the patient, counseling and coordination of care as detailed above.  Jane Canary, MD   08/15/2022 3:40 PM

## 2022-08-17 ENCOUNTER — Ambulatory Visit (INDEPENDENT_AMBULATORY_CARE_PROVIDER_SITE_OTHER): Payer: Medicare Other | Admitting: Nurse Practitioner

## 2022-08-17 DIAGNOSIS — L97201 Non-pressure chronic ulcer of unspecified calf limited to breakdown of skin: Secondary | ICD-10-CM

## 2022-08-17 NOTE — Progress Notes (Signed)
History of Present Illness  There is no documented history at this time  Assessments & Plan   There are no diagnoses linked to this encounter.    Additional instructions  Subjective:  Patient presents with venous ulcer of the Bilateral lower extremity.    Procedure:  3 layer unna wrap was placed Bilateral lower extremity.   Plan:   Follow up in one week.  

## 2022-08-23 MED FILL — Ferumoxytol Inj 510 MG/17ML (30 MG/ML) (Elemental Fe): INTRAVENOUS | Qty: 17 | Status: AC

## 2022-08-24 ENCOUNTER — Encounter (INDEPENDENT_AMBULATORY_CARE_PROVIDER_SITE_OTHER): Payer: Self-pay | Admitting: Nurse Practitioner

## 2022-08-24 ENCOUNTER — Ambulatory Visit: Payer: Medicare Other

## 2022-08-24 ENCOUNTER — Ambulatory Visit (INDEPENDENT_AMBULATORY_CARE_PROVIDER_SITE_OTHER): Payer: Medicare Other | Admitting: Nurse Practitioner

## 2022-08-24 ENCOUNTER — Inpatient Hospital Stay: Payer: Medicare Other

## 2022-08-24 ENCOUNTER — Other Ambulatory Visit: Payer: Medicare Other

## 2022-08-24 VITALS — BP 118/66 | HR 84 | Ht 66.0 in | Wt 177.2 lb

## 2022-08-24 VITALS — BP 125/69 | HR 79

## 2022-08-24 DIAGNOSIS — L97201 Non-pressure chronic ulcer of unspecified calf limited to breakdown of skin: Secondary | ICD-10-CM

## 2022-08-24 DIAGNOSIS — D631 Anemia in chronic kidney disease: Secondary | ICD-10-CM

## 2022-08-24 DIAGNOSIS — I350 Nonrheumatic aortic (valve) stenosis: Secondary | ICD-10-CM

## 2022-08-24 DIAGNOSIS — N1832 Chronic kidney disease, stage 3b: Secondary | ICD-10-CM | POA: Diagnosis not present

## 2022-08-24 DIAGNOSIS — N184 Chronic kidney disease, stage 4 (severe): Secondary | ICD-10-CM | POA: Diagnosis not present

## 2022-08-24 DIAGNOSIS — D649 Anemia, unspecified: Secondary | ICD-10-CM

## 2022-08-24 LAB — CBC WITH DIFFERENTIAL/PLATELET
Abs Immature Granulocytes: 0.01 10*3/uL (ref 0.00–0.07)
Basophils Absolute: 0 10*3/uL (ref 0.0–0.1)
Basophils Relative: 1 %
Eosinophils Absolute: 0.1 10*3/uL (ref 0.0–0.5)
Eosinophils Relative: 2 %
HCT: 27.3 % — ABNORMAL LOW (ref 39.0–52.0)
Hemoglobin: 8.5 g/dL — ABNORMAL LOW (ref 13.0–17.0)
Immature Granulocytes: 0 %
Lymphocytes Relative: 14 %
Lymphs Abs: 0.6 10*3/uL — ABNORMAL LOW (ref 0.7–4.0)
MCH: 29.7 pg (ref 26.0–34.0)
MCHC: 31.1 g/dL (ref 30.0–36.0)
MCV: 95.5 fL (ref 80.0–100.0)
Monocytes Absolute: 0.4 10*3/uL (ref 0.1–1.0)
Monocytes Relative: 10 %
Neutro Abs: 3 10*3/uL (ref 1.7–7.7)
Neutrophils Relative %: 73 %
Platelets: 135 10*3/uL — ABNORMAL LOW (ref 150–400)
RBC: 2.86 MIL/uL — ABNORMAL LOW (ref 4.22–5.81)
RDW: 15.9 % — ABNORMAL HIGH (ref 11.5–15.5)
WBC: 4 10*3/uL (ref 4.0–10.5)
nRBC: 0 % (ref 0.0–0.2)

## 2022-08-24 MED ORDER — DARBEPOETIN ALFA 100 MCG/0.5ML IJ SOSY
100.0000 ug | PREFILLED_SYRINGE | Freq: Once | INTRAMUSCULAR | Status: AC
  Administered 2022-08-24: 100 ug via SUBCUTANEOUS
  Filled 2022-08-24: qty 0.5

## 2022-08-24 NOTE — Progress Notes (Signed)
Subjective:    Patient ID: Bill Peterson, male    DOB: 31-Aug-1934, 87 y.o.   MRN: [id number] Chief Complaint  Patient presents with   Follow-up    Patient in for follow up on unna boots    Bill Peterson is an 87 year old male who returns today for evaluation of his bilateral wounds and ulcerations and edema.  The wounds are improving but not completely resolved.  The swelling is worse in his right lower extremity than the left but previous noninvasive studies do not show any evidence of any DVT.  The patient has known worsening aortic stenosis followed by his cardiologist.  He also recently saw his nephrologist and his kidney function continues to decline.  His most recent kidney function was at 19.  He currently is tolerating the Unna boots without issue.    Review of Systems  Cardiovascular:  Positive for leg swelling.  Skin:  Positive for wound.  All other systems reviewed and are negative.      Objective:   Physical Exam Vitals reviewed.  HENT:     Head: Normocephalic.  Cardiovascular:     Rate and Rhythm: Normal rate.  Pulmonary:     Effort: Pulmonary effort is normal.  Musculoskeletal:     Right lower leg: Edema present.     Left lower leg: Edema present.  Skin:    General: Skin is warm and dry.  Neurological:     Mental Status: He is alert and oriented to person, place, and time.  Psychiatric:        Mood and Affect: Mood normal.        Behavior: Behavior normal.        Thought Content: Thought content normal.        Judgment: Judgment normal.     BP 118/66 (BP Location: Right Arm, Patient Position: Sitting, Cuff Size: Normal)   Pulse 84   Ht '5\' 6"'$  (1.676 m)   Wt 177 lb 3.2 oz (80.4 kg)   BMI 28.60 kg/m   Past Medical History:  Diagnosis Date   BPH (benign prostatic hyperplasia)    CHF (congestive heart failure) (HCC)    Diabetes mellitus without complication (HCC)    Hyperlipidemia    Hypertension     Social History   Socioeconomic History    Marital status: Married    Spouse name: Not on file   Number of children: Not on file   Years of education: Not on file   Highest education level: Not on file  Occupational History   Occupation: retired  Tobacco Use   Smoking status: Never    Passive exposure: Never   Smokeless tobacco: Never  Vaping Use   Vaping Use: Never used  Substance and Sexual Activity   Alcohol use: No   Drug use: No   Sexual activity: Yes  Other Topics Concern   Not on file  Social History Narrative   Lives at home with his wife. Active at baseline   Social Determinants of Health   Financial Resource Strain: Low Risk  (10/15/2018)   Overall Financial Resource Strain (CARDIA)    Difficulty of Paying Living Expenses: Not hard at all  Food Insecurity: No Food Insecurity (09/14/2017)   Hunger Vital Sign    Worried About Running Out of Food in the Last Year: Never true    Ran Out of Food in the Last Year: Never true  Transportation Needs: No Transportation Needs (09/14/2017)   PRAPARE - Transportation  Lack of Transportation (Medical): No    Lack of Transportation (Non-Medical): No  Physical Activity: Unknown (10/15/2018)   Exercise Vital Sign    Days of Exercise per Week: 3 days    Minutes of Exercise per Session: Not on file  Stress: No Stress Concern Present (10/15/2018)   [place]    Feeling of Stress : Only a little  Social Connections: Unknown (10/15/2018)   Social Connection and Isolation Panel [NHANES]    Frequency of Communication with Friends and Family: Not on file    Frequency of Social Gatherings with Friends and Family: More than three times a week    Attends Religious Services: Not on file    Active Member of Clubs or Organizations: Not on file    Attends Archivist Meetings: Not on file    Marital Status: Not on file  Intimate Partner Violence: Not At Risk (09/14/2017)   Humiliation, Afraid, Rape, and Kick  questionnaire    Fear of Current or Ex-Partner: No    Emotionally Abused: No    Physically Abused: No    Sexually Abused: No    Past Surgical History:  Procedure Laterality Date   BACK SURGERY     BIOPSY PROSTATE  08/2018   CARDIAC SURGERY     STOMACH SURGERY      Family History  Problem Relation Age of Onset   Heart disease Mother    Diabetes Father    Prostate cancer Neg Hx    Kidney cancer Neg Hx    Bladder Cancer Neg Hx     Allergies  Allergen Reactions   Heparin     REACTION: Unspecified   Repaglinide Other (See Comments)    Increased appetite Increased appetite Increased appetite        Latest Ref Rng & Units 08/24/2022    9:11 AM 09/06/2017    6:45 AM 09/05/2017    4:14 PM  CBC  WBC 4.0 - 10.5 K/uL 4.0  4.8  10.1   Hemoglobin 13.0 - 17.0 g/dL 8.5  10.5  12.3   Hematocrit 39.0 - 52.0 % 27.3  31.2  36.9   Platelets 150 - 400 K/uL 135  193  231       CMP     Component Value Date/Time   NA 141 09/06/2017 0645   K 3.5 09/06/2017 0645   CL 104 09/06/2017 0645   CO2 28 09/06/2017 0645   GLUCOSE 137 (H) 09/06/2017 0645   BUN 23 (H) 09/06/2017 0645   CREATININE 1.76 (H) 09/06/2017 0645   CALCIUM 8.6 (L) 09/06/2017 0645   PROT 5.7 (L) 11/04/2007 0400   ALBUMIN 3.0 (L) 08/18/2017 0500   AST 30 11/04/2007 0400   ALT 23 11/04/2007 0400   ALKPHOS 44 11/04/2007 0400   BILITOT 0.3 11/04/2007 0400   GFRNONAA 35 (L) 09/06/2017 0645   GFRAA 40 (L) 09/06/2017 0645     No results found.     Assessment & Plan:   1. Skin ulcer of calf, limited to breakdown of skin, unspecified laterality [facility]) The patient will continue with Unna boots changed on a weekly basis.  I had a long discussion with the patient regarding his swelling and why he likely continues to have exacerbations of his swelling.  I suspect that he may have an underlying component of lymphedema but it is largely being driven by his chronic kidney disease as well as his aortic valve stenosis.  Given  these are going to be longer-term issues, even once the patient's wound to heal he will need to maintain some sort of compression whether his compression socks or continuing in Unna boots.  The patient return in 4 weeks for reevaluation of the venous wounds.  2. Aortic valve stenosis, etiology of cardiac valve disease unspecified She will continue to follow with his cardiologist, Dr. Clayborn Bigness.  3. Stage 3b chronic kidney disease ([place]) Currently the patient is not yet progressed to dialysis stage yet but he is nearing closer.  Based on notes from his nephrologist, he is not yet ready for vein mapping.  I discussed vein mapping with the patient as well as the difference between a fistula versus a graft.  We are happy to have the patient undergo vein mapping as well as further treatment options once his nephrologist deems it necessary.   Current Outpatient Medications on File Prior to Visit  Medication Sig Dispense Refill   aspirin 325 MG tablet Take 325 mg by mouth daily.     atorvastatin (LIPITOR) 20 MG tablet Take 20 mg by mouth daily.     calcitRIOL (ROCALTROL) 0.25 MCG capsule Take by mouth.     carvedilol (COREG) 12.5 MG tablet Take 6.25 mg by mouth 2 (two) times daily with a meal.     Cholecalciferol (VITAMIN D3) 10 MCG (400 UNIT) tablet Take 400 Units by mouth daily.     CINNAMON PO Take 1,000 tablets by mouth daily at 6 (six) AM.     Ferrous Sulfate (IRON) 325 (65 Fe) MG TABS Take 1 tablet by mouth daily.     Garlic 5993 MG CAPS Take 1,000 mg by mouth daily at 6 (six) AM.     Ginger, Zingiber officinalis, (GINGER ROOT) 550 MG CAPS Take 1 capsule by mouth daily at 6 (six) AM.     lisinopril (PRINIVIL,ZESTRIL) 20 MG tablet Take 10 mg by mouth daily.     magnesium oxide (MAG-OX) 400 MG tablet Take 500 mg by mouth daily.     Misc Natural Products (APPLE CIDER VINEGAR DIET PO) Take 450 mg by mouth daily.     Multiple Vitamin (MULTIVITAMIN) capsule Take 1 capsule by mouth daily.      omeprazole (PRILOSEC) 20 MG capsule Take 20 mg by mouth daily.     Potassium 99 MG TABS Take 99 mg by mouth daily as needed.     tamsulosin (FLOMAX) 0.4 MG CAPS capsule Take 1 capsule (0.4 mg total) by mouth 2 (two) times daily. 180 capsule 3   torsemide (DEMADEX) 20 MG tablet Take 20 mg by mouth daily.     Turmeric 500 MG CAPS Take 500 mg by mouth daily.     vitamin E 400 UNIT capsule Take 400 Units by mouth daily.     No current facility-administered medications on file prior to visit.    There are no Patient Instructions on file for this visit. No follow-ups on file.   Kris Hartmann, NP

## 2022-08-28 ENCOUNTER — Encounter (INDEPENDENT_AMBULATORY_CARE_PROVIDER_SITE_OTHER): Payer: Self-pay | Admitting: Nurse Practitioner

## 2022-08-30 ENCOUNTER — Encounter (INDEPENDENT_AMBULATORY_CARE_PROVIDER_SITE_OTHER): Payer: Medicare Other

## 2022-08-31 ENCOUNTER — Encounter (INDEPENDENT_AMBULATORY_CARE_PROVIDER_SITE_OTHER): Payer: Self-pay

## 2022-08-31 ENCOUNTER — Ambulatory Visit (INDEPENDENT_AMBULATORY_CARE_PROVIDER_SITE_OTHER): Payer: Medicare Other | Admitting: Nurse Practitioner

## 2022-08-31 VITALS — BP 121/65 | HR 80 | Resp 15 | Wt 175.4 lb

## 2022-08-31 DIAGNOSIS — L97201 Non-pressure chronic ulcer of unspecified calf limited to breakdown of skin: Secondary | ICD-10-CM | POA: Diagnosis not present

## 2022-08-31 NOTE — Progress Notes (Signed)
History of Present Illness  There is no documented history at this time  Assessments & Plan   There are no diagnoses linked to this encounter.    Additional instructions  Subjective:  Patient presents with venous ulcer of the Bilateral lower extremity.    Procedure:  3 layer unna wrap was placed Bilateral lower extremity.   Plan:   Follow up in one week.  

## 2022-09-04 ENCOUNTER — Encounter: Payer: Self-pay | Admitting: Nephrology

## 2022-09-05 ENCOUNTER — Encounter (INDEPENDENT_AMBULATORY_CARE_PROVIDER_SITE_OTHER): Payer: PRIVATE HEALTH INSURANCE

## 2022-09-07 ENCOUNTER — Ambulatory Visit (INDEPENDENT_AMBULATORY_CARE_PROVIDER_SITE_OTHER): Payer: Medicare Other | Admitting: Nurse Practitioner

## 2022-09-07 ENCOUNTER — Encounter (INDEPENDENT_AMBULATORY_CARE_PROVIDER_SITE_OTHER): Payer: Self-pay

## 2022-09-07 VITALS — BP 112/72 | HR 91 | Resp 16

## 2022-09-07 DIAGNOSIS — L97201 Non-pressure chronic ulcer of unspecified calf limited to breakdown of skin: Secondary | ICD-10-CM | POA: Diagnosis not present

## 2022-09-07 NOTE — Progress Notes (Signed)
History of Present Illness  There is no documented history at this time  Assessments & Plan   There are no diagnoses linked to this encounter.    Additional instructions  Subjective:  Patient presents with venous ulcer of the Bilateral lower extremity.    Procedure:  3 layer unna wrap was placed Bilateral lower extremity.   Plan:   Follow up in one week.  

## 2022-09-11 ENCOUNTER — Other Ambulatory Visit: Payer: Self-pay | Admitting: *Deleted

## 2022-09-11 DIAGNOSIS — D631 Anemia in chronic kidney disease: Secondary | ICD-10-CM

## 2022-09-12 ENCOUNTER — Inpatient Hospital Stay (HOSPITAL_BASED_OUTPATIENT_CLINIC_OR_DEPARTMENT_OTHER): Payer: Medicare Other | Admitting: Internal Medicine

## 2022-09-12 ENCOUNTER — Inpatient Hospital Stay: Payer: Medicare Other

## 2022-09-12 ENCOUNTER — Encounter: Payer: Self-pay | Admitting: Internal Medicine

## 2022-09-12 ENCOUNTER — Inpatient Hospital Stay: Payer: Medicare Other | Attending: Internal Medicine

## 2022-09-12 ENCOUNTER — Encounter (INDEPENDENT_AMBULATORY_CARE_PROVIDER_SITE_OTHER): Payer: PRIVATE HEALTH INSURANCE

## 2022-09-12 VITALS — BP 141/76 | HR 82 | Temp 98.6°F | Resp 20 | Wt 178.7 lb

## 2022-09-12 DIAGNOSIS — D631 Anemia in chronic kidney disease: Secondary | ICD-10-CM | POA: Diagnosis not present

## 2022-09-12 DIAGNOSIS — I130 Hypertensive heart and chronic kidney disease with heart failure and stage 1 through stage 4 chronic kidney disease, or unspecified chronic kidney disease: Secondary | ICD-10-CM | POA: Diagnosis present

## 2022-09-12 DIAGNOSIS — N184 Chronic kidney disease, stage 4 (severe): Secondary | ICD-10-CM

## 2022-09-12 DIAGNOSIS — N1832 Chronic kidney disease, stage 3b: Secondary | ICD-10-CM

## 2022-09-12 DIAGNOSIS — Z79899 Other long term (current) drug therapy: Secondary | ICD-10-CM | POA: Insufficient documentation

## 2022-09-12 LAB — CBC WITH DIFFERENTIAL/PLATELET
Abs Immature Granulocytes: 0.01 10*3/uL (ref 0.00–0.07)
Basophils Absolute: 0 10*3/uL (ref 0.0–0.1)
Basophils Relative: 0 %
Eosinophils Absolute: 0.1 10*3/uL (ref 0.0–0.5)
Eosinophils Relative: 1 %
HCT: 30.8 % — ABNORMAL LOW (ref 39.0–52.0)
Hemoglobin: 9.4 g/dL — ABNORMAL LOW (ref 13.0–17.0)
Immature Granulocytes: 0 %
Lymphocytes Relative: 16 %
Lymphs Abs: 0.6 10*3/uL — ABNORMAL LOW (ref 0.7–4.0)
MCH: 29.6 pg (ref 26.0–34.0)
MCHC: 30.5 g/dL (ref 30.0–36.0)
MCV: 96.9 fL (ref 80.0–100.0)
Monocytes Absolute: 0.3 10*3/uL (ref 0.1–1.0)
Monocytes Relative: 9 %
Neutro Abs: 2.6 10*3/uL (ref 1.7–7.7)
Neutrophils Relative %: 74 %
Platelets: 115 10*3/uL — ABNORMAL LOW (ref 150–400)
RBC: 3.18 MIL/uL — ABNORMAL LOW (ref 4.22–5.81)
RDW: 15.9 % — ABNORMAL HIGH (ref 11.5–15.5)
WBC: 3.5 10*3/uL — ABNORMAL LOW (ref 4.0–10.5)
nRBC: 0 % (ref 0.0–0.2)

## 2022-09-12 MED ORDER — DARBEPOETIN ALFA 100 MCG/0.5ML IJ SOSY
100.0000 ug | PREFILLED_SYRINGE | Freq: Once | INTRAMUSCULAR | Status: AC
  Administered 2022-09-12: 100 ug via SUBCUTANEOUS
  Filled 2022-09-12: qty 0.5

## 2022-09-12 NOTE — Progress Notes (Signed)
[place]  [phone] [phone]  ID: Bill Peterson OB: [birth year]  MR#: [id number]  ALP#:[id number]  Patient Care Team: Kirk Ruths, MD as PCP - General (Internal Medicine)  REFERRING PROVIDER: Dr. Holley Raring  REASON FOR REFERRAL: anemia   HPI: Bill Peterson is a 88 y.o. male with past medical history of hyperlipidemia, hypertension, diabetes, CHF and CKD stage IV was referred to hematology for further management of anemia.  Patient follows with Dr. Holley Raring for CKD.  Labs from 07/03/2022 showed hemoglobin 7.8, WBC 4.6 and platelet 192.  No recent iron studies.  Takes oral iron daily.  Patient was started on Aranesp on 08/01/2022.  He is tolerating well.  Received 1 dose of IV ferraheme in Dec 2023.   INTERVAL HISTORY-  Patient seen today for Aranesp injection. Reports improvement in energy levels after starting injections.  Tolerating well.   REVIEW OF SYSTEMS:   Review of Systems  Respiratory:  Negative for cough and shortness of breath.   Cardiovascular:  Negative for chest pain and palpitations.  Gastrointestinal:  Negative for abdominal pain, nausea and vomiting.  All other systems reviewed and are negative.   As per HPI. Otherwise, a complete review of systems is negative.  PAST MEDICAL HISTORY: Past Medical History:  Diagnosis Date   BPH (benign prostatic hyperplasia)    CHF (congestive heart failure) (HCC)    Diabetes mellitus without complication (Bill Peterson)    Hyperlipidemia    Hypertension     PAST SURGICAL HISTORY: Past Surgical History:  Procedure Laterality Date   BACK SURGERY     BIOPSY PROSTATE  08/2018   CARDIAC SURGERY     STOMACH SURGERY      FAMILY HISTORY: Family History  Problem Relation Age of Onset   Heart disease Mother    Diabetes Father    Prostate cancer Neg Hx    Kidney cancer Neg Hx    Bladder Cancer Neg Hx     HEALTH MAINTENANCE: Social History   Tobacco Use   Smoking status: Never     Passive exposure: Never   Smokeless tobacco: Never  Vaping Use   Vaping Use: Never used  Substance Use Topics   Alcohol use: No   Drug use: No     Allergies  Allergen Reactions   Heparin     REACTION: Unspecified   Repaglinide Other (See Comments)    Increased appetite Increased appetite Increased appetite     Current Outpatient Medications  Medication Sig Dispense Refill   aspirin 325 MG tablet Take 325 mg by mouth daily.     atorvastatin (LIPITOR) 20 MG tablet Take 20 mg by mouth daily.     calcitRIOL (ROCALTROL) 0.25 MCG capsule Take by mouth.     carvedilol (COREG) 12.5 MG tablet Take 6.25 mg by mouth 2 (two) times daily with a meal.     Cholecalciferol (VITAMIN D3) 10 MCG (400 UNIT) tablet Take 400 Units by mouth daily.     CINNAMON PO Take 1,000 tablets by mouth daily at 6 (six) AM.     Ferrous Sulfate (IRON) 325 (65 Fe) MG TABS Take 1 tablet by mouth daily.     Garlic 3532 MG CAPS Take 1,000 mg by mouth daily at 6 (six) AM.     Ginger, Zingiber officinalis, (GINGER ROOT) 550 MG CAPS Take 1 capsule by mouth daily at 6 (six) AM.     lisinopril (PRINIVIL,ZESTRIL) 20 MG tablet Take 10 mg by mouth daily.  magnesium oxide (MAG-OX) 400 MG tablet Take 500 mg by mouth daily.     Misc Natural Products (APPLE CIDER VINEGAR DIET PO) Take 450 mg by mouth daily.     Multiple Vitamin (MULTIVITAMIN) capsule Take 1 capsule by mouth daily.     omeprazole (PRILOSEC) 20 MG capsule Take 20 mg by mouth daily.     Potassium 99 MG TABS Take 99 mg by mouth daily as needed.     tamsulosin (FLOMAX) 0.4 MG CAPS capsule Take 1 capsule (0.4 mg total) by mouth 2 (two) times daily. 180 capsule 3   torsemide (DEMADEX) 20 MG tablet Take 20 mg by mouth daily.     Turmeric 500 MG CAPS Take 500 mg by mouth daily.     vitamin E 400 UNIT capsule Take 400 Units by mouth daily.     No current facility-administered medications for this visit.    OBJECTIVE: Vitals:   09/12/22 1038  BP: (!) 141/76   Pulse: 82  Resp: 20  Temp: 98.6 F (37 C)  SpO2: 100%     Body mass index is 28.84 kg/m.      General: Well-developed, well-nourished, no acute distress. Eyes: Pink conjunctiva, anicteric sclera. HEENT: Normocephalic, moist mucous membranes, clear oropharnyx. Lungs: Clear to auscultation bilaterally. Heart: Regular rate and rhythm. No rubs, murmurs, or gallops. Abdomen: Soft, nontender, nondistended. No organomegaly noted, normoactive bowel sounds. Musculoskeletal: No edema, cyanosis, or clubbing. Neuro: Alert, answering all questions appropriately. Cranial nerves grossly intact. Skin: No rashes or petechiae noted. Psych: Normal affect. Lymphatics: No cervical, calvicular, axillary or inguinal LAD.   LAB RESULTS:  Lab Results  Component Value Date   NA 141 09/06/2017   K 3.5 09/06/2017   CL 104 09/06/2017   CO2 28 09/06/2017   GLUCOSE 137 (H) 09/06/2017   BUN 23 (H) 09/06/2017   CREATININE 1.76 (H) 09/06/2017   CALCIUM 8.6 (L) 09/06/2017   PROT 5.7 (L) 11/04/2007   ALBUMIN 3.0 (L) 08/18/2017   AST 30 11/04/2007   ALT 23 11/04/2007   ALKPHOS 44 11/04/2007   BILITOT 0.3 11/04/2007   GFRNONAA 35 (L) 09/06/2017   GFRAA 40 (L) 09/06/2017    Lab Results  Component Value Date   WBC 3.5 (L) 09/12/2022   NEUTROABS 2.6 09/12/2022   HGB 9.4 (L) 09/12/2022   HCT 30.8 (L) 09/12/2022   MCV 96.9 09/12/2022   PLT 115 (L) 09/12/2022    Lab Results  Component Value Date   TIBC 288 07/25/2022   FERRITIN 37 07/25/2022   IRONPCTSAT 13 (L) 07/25/2022     STUDIES: No results found.  ASSESSMENT AND PLAN:   Bill Peterson is a 88 y.o. male with pmh of hyperlipidemia, hypertension, diabetes, CHF and CKD stage IV was referred to hematology for further management of anemia.  #Normocytic anemia  #CKD Stage IV -Anemia is likely secondary to chronic kidney disease.  B12 and folate normal.  Ferritin 36, saturation 13%.  Patient is taking oral iron daily.  Received 1 dose of IV  Feraheme in December 2023.  Started on Aranesp 100 mics on 08/01/2022.tolerating well.  Hemoglobin has improved to 9.4 today.  I will continue with the same dose every 3 weeks for now.  Patient reports improvement in energy levels.  # Hypertension # CAD status post PCI and stent # Moderate aortic stenosis # Diastolic CHF -Follows with cardiology Dr. Oralia Manis at [facility]. Stable.   #DM - per primary  Orders Placed This Encounter  Procedures  Hemoglobin and hematocrit, blood   CBC with Differential/Platelet    RTC in 3 weeks for H&H and Aranesp 100 mics RTC in 6 weeks for MD visit, labs, Aranesp.  Patient expressed understanding and was in agreement with this plan. He also understands that He can call clinic at any time with any questions, concerns, or complaints.   I spent a total of 30 minutes reviewing chart data, face-to-face evaluation with the patient, counseling and coordination of care as detailed above.  Jane Canary, MD   09/12/2022 3:16 PM

## 2022-09-14 ENCOUNTER — Encounter (INDEPENDENT_AMBULATORY_CARE_PROVIDER_SITE_OTHER): Payer: Self-pay

## 2022-09-14 ENCOUNTER — Ambulatory Visit (INDEPENDENT_AMBULATORY_CARE_PROVIDER_SITE_OTHER): Payer: Medicare Other | Admitting: Nurse Practitioner

## 2022-09-14 VITALS — BP 143/70 | HR 79 | Resp 16

## 2022-09-14 DIAGNOSIS — L97201 Non-pressure chronic ulcer of unspecified calf limited to breakdown of skin: Secondary | ICD-10-CM

## 2022-09-14 NOTE — Progress Notes (Signed)
History of Present Illness  There is no documented history at this time  Assessments & Plan   There are no diagnoses linked to this encounter.    Additional instructions  Subjective:  Patient presents with venous ulcer of the Bilateral lower extremity.    Procedure:  3 layer unna wrap was placed Bilateral lower extremity.   Plan:   Follow up in one week.  

## 2022-09-18 ENCOUNTER — Encounter (INDEPENDENT_AMBULATORY_CARE_PROVIDER_SITE_OTHER): Payer: Self-pay | Admitting: Nurse Practitioner

## 2022-09-19 ENCOUNTER — Encounter (INDEPENDENT_AMBULATORY_CARE_PROVIDER_SITE_OTHER): Payer: PRIVATE HEALTH INSURANCE

## 2022-09-21 ENCOUNTER — Ambulatory Visit (INDEPENDENT_AMBULATORY_CARE_PROVIDER_SITE_OTHER): Payer: Medicare Other | Admitting: Nurse Practitioner

## 2022-09-21 DIAGNOSIS — L97201 Non-pressure chronic ulcer of unspecified calf limited to breakdown of skin: Secondary | ICD-10-CM | POA: Diagnosis not present

## 2022-09-21 NOTE — Progress Notes (Signed)
History of Present Illness  There is no documented history at this time  Assessments & Plan   There are no diagnoses linked to this encounter.    Additional instructions  Subjective:  Patient presents with venous ulcer of the Bilateral lower extremity.    Procedure:  3 layer calamine and xeroform unna wrap was placed Bilateral lower extremity.   Plan:   Follow up in one week.

## 2022-09-23 ENCOUNTER — Encounter (INDEPENDENT_AMBULATORY_CARE_PROVIDER_SITE_OTHER): Payer: Self-pay | Admitting: Nurse Practitioner

## 2022-09-26 ENCOUNTER — Ambulatory Visit (INDEPENDENT_AMBULATORY_CARE_PROVIDER_SITE_OTHER): Payer: Medicare Other | Admitting: Nurse Practitioner

## 2022-09-26 ENCOUNTER — Encounter (INDEPENDENT_AMBULATORY_CARE_PROVIDER_SITE_OTHER): Payer: Self-pay | Admitting: Nurse Practitioner

## 2022-09-28 ENCOUNTER — Ambulatory Visit (INDEPENDENT_AMBULATORY_CARE_PROVIDER_SITE_OTHER): Payer: Medicare Other | Admitting: Nurse Practitioner

## 2022-09-28 ENCOUNTER — Encounter (INDEPENDENT_AMBULATORY_CARE_PROVIDER_SITE_OTHER): Payer: Self-pay | Admitting: Nurse Practitioner

## 2022-09-28 VITALS — BP 124/78 | HR 78 | Ht 66.0 in | Wt 185.0 lb

## 2022-09-28 DIAGNOSIS — N1832 Chronic kidney disease, stage 3b: Secondary | ICD-10-CM | POA: Diagnosis not present

## 2022-09-28 DIAGNOSIS — L97201 Non-pressure chronic ulcer of unspecified calf limited to breakdown of skin: Secondary | ICD-10-CM

## 2022-09-28 DIAGNOSIS — I350 Nonrheumatic aortic (valve) stenosis: Secondary | ICD-10-CM | POA: Diagnosis not present

## 2022-10-03 ENCOUNTER — Inpatient Hospital Stay: Payer: Medicare Other | Attending: Internal Medicine

## 2022-10-03 ENCOUNTER — Inpatient Hospital Stay: Payer: Medicare Other

## 2022-10-03 VITALS — BP 126/69

## 2022-10-03 DIAGNOSIS — E1122 Type 2 diabetes mellitus with diabetic chronic kidney disease: Secondary | ICD-10-CM | POA: Insufficient documentation

## 2022-10-03 DIAGNOSIS — Z79899 Other long term (current) drug therapy: Secondary | ICD-10-CM | POA: Insufficient documentation

## 2022-10-03 DIAGNOSIS — N184 Chronic kidney disease, stage 4 (severe): Secondary | ICD-10-CM | POA: Insufficient documentation

## 2022-10-03 DIAGNOSIS — D631 Anemia in chronic kidney disease: Secondary | ICD-10-CM

## 2022-10-03 DIAGNOSIS — I130 Hypertensive heart and chronic kidney disease with heart failure and stage 1 through stage 4 chronic kidney disease, or unspecified chronic kidney disease: Secondary | ICD-10-CM | POA: Diagnosis present

## 2022-10-03 LAB — HEMOGLOBIN AND HEMATOCRIT, BLOOD
HCT: 31.9 % — ABNORMAL LOW (ref 39.0–52.0)
Hemoglobin: 9.7 g/dL — ABNORMAL LOW (ref 13.0–17.0)

## 2022-10-03 MED ORDER — DARBEPOETIN ALFA 100 MCG/0.5ML IJ SOSY
100.0000 ug | PREFILLED_SYRINGE | Freq: Once | INTRAMUSCULAR | Status: AC
  Administered 2022-10-03: 100 ug via SUBCUTANEOUS
  Filled 2022-10-03: qty 0.5

## 2022-10-05 ENCOUNTER — Ambulatory Visit (INDEPENDENT_AMBULATORY_CARE_PROVIDER_SITE_OTHER): Payer: Medicare Other | Admitting: Nurse Practitioner

## 2022-10-05 DIAGNOSIS — L97201 Non-pressure chronic ulcer of unspecified calf limited to breakdown of skin: Secondary | ICD-10-CM | POA: Diagnosis not present

## 2022-10-05 NOTE — Progress Notes (Signed)
History of Present Illness  There is no documented history at this time  Assessments & Plan   There are no diagnoses linked to this encounter.    Additional instructions  Subjective:  Patient presents with venous ulcer of the Bilateral lower extremity.    Procedure:  2 layer Calamine unna wrap was placed Bilateral lower extremity.   Plan:   Follow up in one week.

## 2022-10-12 ENCOUNTER — Encounter (INDEPENDENT_AMBULATORY_CARE_PROVIDER_SITE_OTHER): Payer: Self-pay | Admitting: Nurse Practitioner

## 2022-10-12 ENCOUNTER — Ambulatory Visit (INDEPENDENT_AMBULATORY_CARE_PROVIDER_SITE_OTHER): Payer: Medicare Other | Admitting: Nurse Practitioner

## 2022-10-12 VITALS — BP 137/77 | HR 80 | Resp 16

## 2022-10-12 DIAGNOSIS — L97201 Non-pressure chronic ulcer of unspecified calf limited to breakdown of skin: Secondary | ICD-10-CM | POA: Diagnosis not present

## 2022-10-12 NOTE — Progress Notes (Signed)
History of Present Illness  There is no documented history at this time  Assessments & Plan   There are no diagnoses linked to this encounter.    Additional instructions  Subjective:  Patient presents with venous ulcer of the Bilateral lower extremity.    Procedure:  3 layer unna wrap was placed Bilateral lower extremity.   Plan:   Follow up in one week.  

## 2022-10-15 ENCOUNTER — Encounter (INDEPENDENT_AMBULATORY_CARE_PROVIDER_SITE_OTHER): Payer: Self-pay | Admitting: Nurse Practitioner

## 2022-10-16 ENCOUNTER — Encounter (INDEPENDENT_AMBULATORY_CARE_PROVIDER_SITE_OTHER): Payer: Self-pay | Admitting: Nurse Practitioner

## 2022-10-16 NOTE — Progress Notes (Signed)
Subjective:    Patient ID: Bill Peterson, male    DOB: [birth year], 88 y.o.   MRN: [id number] Chief Complaint  Patient presents with   Wound Check    Bill Peterson is an 88 year old male who returns today for evaluation of his bilateral wounds and ulcerations and edema.  The wounds are much improved.  There is still some small areas of ulceration.  The swelling is worse in his right lower extremity than the left but previous noninvasive studies do not show any evidence of any DVT.  The patient has known worsening aortic stenosis followed by his cardiologist.  He also recently saw his nephrologist and his kidney function continues to decline.  His most recent kidney function was at 19.  He currently is tolerating the Unna boots without issue.    Review of Systems  Cardiovascular:  Positive for leg swelling.  Skin:  Positive for wound.  All other systems reviewed and are negative.      Objective:   Physical Exam Vitals reviewed.  HENT:     Head: Normocephalic.  Cardiovascular:     Rate and Rhythm: Normal rate.  Pulmonary:     Effort: Pulmonary effort is normal.  Musculoskeletal:     Right lower leg: Edema present.     Left lower leg: Edema present.  Skin:    General: Skin is warm and dry.  Neurological:     Mental Status: He is alert and oriented to person, place, and time.  Psychiatric:        Mood and Affect: Mood normal.        Behavior: Behavior normal.        Thought Content: Thought content normal.        Judgment: Judgment normal.     BP 124/78   Pulse 78   Ht 5' 6"$  (1.676 m)   Wt 185 lb (83.9 kg)   BMI 29.86 kg/m   Past Medical History:  Diagnosis Date   BPH (benign prostatic hyperplasia)    CHF (congestive heart failure) (HCC)    Diabetes mellitus without complication ([place])    Hyperlipidemia    Hypertension     Social History   Socioeconomic History   Marital status: Married    Spouse name: Not on file   Number of children: Not on file   Years  of education: Not on file   Highest education level: Not on file  Occupational History   Occupation: retired  Tobacco Use   Smoking status: Never    Passive exposure: Never   Smokeless tobacco: Never  Vaping Use   Vaping Use: Never used  Substance and Sexual Activity   Alcohol use: No   Drug use: No   Sexual activity: Yes  Other Topics Concern   Not on file  Social History Narrative   Lives at home with his wife. Active at baseline   Social Determinants of Health   Financial Resource Strain: Low Risk  (10/15/2018)   Overall Financial Resource Strain (CARDIA)    Difficulty of Paying Living Expenses: Not hard at all  Food Insecurity: No Food Insecurity (09/14/2017)   Hunger Vital Sign    Worried About Running Out of Food in the Last Year: Never true    Ran Out of Food in the Last Year: Never true  Transportation Needs: No Transportation Needs (09/14/2017)   PRAPARE - Hydrologist (Medical): No    Lack of Transportation (Non-Medical): No  Physical Activity: Unknown (10/15/2018)   Exercise Vital Sign    Days of Exercise per Week: 3 days    Minutes of Exercise per Session: Not on file  Stress: No Stress Concern Present (10/15/2018)   [place]    Feeling of Stress : Only a little  Social Connections: Unknown (10/15/2018)   Social Connection and Isolation Panel [NHANES]    Frequency of Communication with Friends and Family: Not on file    Frequency of Social Gatherings with Friends and Family: More than three times a week    Attends Religious Services: Not on file    Active Member of Clubs or Organizations: Not on file    Attends Archivist Meetings: Not on file    Marital Status: Not on file  Intimate Partner Violence: Not At Risk (09/14/2017)   Humiliation, Afraid, Rape, and Kick questionnaire    Fear of Current or Ex-Partner: No    Emotionally Abused: No    Physically  Abused: No    Sexually Abused: No    Past Surgical History:  Procedure Laterality Date   BACK SURGERY     BIOPSY PROSTATE  08/2018   CARDIAC SURGERY     STOMACH SURGERY      Family History  Problem Relation Age of Onset   Heart disease Mother    Diabetes Father    Prostate cancer Neg Hx    Kidney cancer Neg Hx    Bladder Cancer Neg Hx     Allergies  Allergen Reactions   Heparin     REACTION: Unspecified   Repaglinide Other (See Comments)    Increased appetite Increased appetite Increased appetite        Latest Ref Rng & Units 10/03/2022   10:51 AM 09/12/2022   10:19 AM 08/24/2022    9:11 AM  CBC  WBC 4.0 - 10.5 K/uL  3.5  4.0   Hemoglobin 13.0 - 17.0 g/dL 9.7  9.4  8.5   Hematocrit 39.0 - 52.0 % 31.9  30.8  27.3   Platelets 150 - 400 K/uL  115  135       CMP     Component Value Date/Time   NA 141 09/06/2017 0645   K 3.5 09/06/2017 0645   CL 104 09/06/2017 0645   CO2 28 09/06/2017 0645   GLUCOSE 137 (H) 09/06/2017 0645   BUN 23 (H) 09/06/2017 0645   CREATININE 1.76 (H) 09/06/2017 0645   CALCIUM 8.6 (L) 09/06/2017 0645   PROT 5.7 (L) 11/04/2007 0400   ALBUMIN 3.0 (L) 08/18/2017 0500   AST 30 11/04/2007 0400   ALT 23 11/04/2007 0400   ALKPHOS 44 11/04/2007 0400   BILITOT 0.3 11/04/2007 0400   GFRNONAA 35 (L) 09/06/2017 0645   GFRAA 40 (L) 09/06/2017 0645     No results found.     Assessment & Plan:   1. Skin ulcer of calf, limited to breakdown of skin, unspecified laterality [facility]) The patient will continue with Unna boots changed on a weekly basis.  I had a long discussion with the patient regarding his swelling and why he likely continues to have exacerbations of his swelling.  I suspect that he may have an underlying component of lymphedema but it is largely being driven by his chronic kidney disease as well as his aortic valve stenosis.  Given these are going to be longer-term issues, even once the patient's wound to heal he will need to  maintain  some sort of compression whether his compression socks or continuing in Unna boots.  The patient return in 4 weeks for reevaluation of the venous wounds.  2. Aortic valve stenosis, etiology of cardiac valve disease unspecified She will continue to follow with his cardiologist, Dr. Clayborn Bigness.  3. Stage 3b chronic kidney disease ([place]) Continue to be followed by nephrology.  This also is contributing to the patient's lower extremity edema   Current Outpatient Medications on File Prior to Visit  Medication Sig Dispense Refill   aspirin 325 MG tablet Take 325 mg by mouth daily.     atorvastatin (LIPITOR) 20 MG tablet Take 20 mg by mouth daily.     calcitRIOL (ROCALTROL) 0.25 MCG capsule Take by mouth.     carvedilol (COREG) 12.5 MG tablet Take 6.25 mg by mouth 2 (two) times daily with a meal.     Cholecalciferol (VITAMIN D3) 10 MCG (400 UNIT) tablet Take 400 Units by mouth daily.     CINNAMON PO Take 1,000 tablets by mouth daily at 6 (six) AM.     Ferrous Sulfate (IRON) 325 (65 Fe) MG TABS Take 1 tablet by mouth daily.     Garlic 123XX123 MG CAPS Take 1,000 mg by mouth daily at 6 (six) AM.     Ginger, Zingiber officinalis, (GINGER ROOT) 550 MG CAPS Take 1 capsule by mouth daily at 6 (six) AM.     lisinopril (PRINIVIL,ZESTRIL) 20 MG tablet Take 10 mg by mouth daily.     magnesium oxide (MAG-OX) 400 MG tablet Take 500 mg by mouth daily.     Misc Natural Products (APPLE CIDER VINEGAR DIET PO) Take 450 mg by mouth daily.     Multiple Vitamin (MULTIVITAMIN) capsule Take 1 capsule by mouth daily.     omeprazole (PRILOSEC) 20 MG capsule Take 20 mg by mouth daily.     Potassium 99 MG TABS Take 99 mg by mouth daily as needed.     tamsulosin (FLOMAX) 0.4 MG CAPS capsule Take 1 capsule (0.4 mg total) by mouth 2 (two) times daily. 180 capsule 3   torsemide (DEMADEX) 20 MG tablet Take 20 mg by mouth daily.     Turmeric 500 MG CAPS Take 500 mg by mouth daily.     vitamin E 400 UNIT capsule Take 400 Units by  mouth daily.     No current facility-administered medications on file prior to visit.    There are no Patient Instructions on file for this visit. No follow-ups on file.   Kris Hartmann, NP

## 2022-10-19 ENCOUNTER — Encounter (INDEPENDENT_AMBULATORY_CARE_PROVIDER_SITE_OTHER): Payer: Self-pay

## 2022-10-19 ENCOUNTER — Ambulatory Visit (INDEPENDENT_AMBULATORY_CARE_PROVIDER_SITE_OTHER): Payer: Medicare Other | Admitting: Nurse Practitioner

## 2022-10-19 VITALS — BP 120/65 | HR 78 | Resp 18

## 2022-10-19 DIAGNOSIS — L97201 Non-pressure chronic ulcer of unspecified calf limited to breakdown of skin: Secondary | ICD-10-CM | POA: Diagnosis not present

## 2022-10-19 NOTE — Progress Notes (Signed)
History of Present Illness  There is no documented history at this time  Assessments & Plan   There are no diagnoses linked to this encounter.    Additional instructions  Subjective:  Patient presents with venous ulcer of the Bilateral lower extremity.    Procedure:  3 layer unna wrap was placed Bilateral lower extremity.   Plan:   Follow up in one week.  

## 2022-10-24 ENCOUNTER — Encounter: Payer: Self-pay | Admitting: Internal Medicine

## 2022-10-24 ENCOUNTER — Inpatient Hospital Stay (HOSPITAL_BASED_OUTPATIENT_CLINIC_OR_DEPARTMENT_OTHER): Payer: Medicare Other | Admitting: Internal Medicine

## 2022-10-24 ENCOUNTER — Inpatient Hospital Stay: Payer: Medicare Other

## 2022-10-24 ENCOUNTER — Inpatient Hospital Stay (HOSPITAL_BASED_OUTPATIENT_CLINIC_OR_DEPARTMENT_OTHER): Payer: Medicare Other

## 2022-10-24 VITALS — BP 149/77 | HR 81 | Temp 97.8°F | Resp 20 | Wt 185.6 lb

## 2022-10-24 DIAGNOSIS — E611 Iron deficiency: Secondary | ICD-10-CM

## 2022-10-24 DIAGNOSIS — D631 Anemia in chronic kidney disease: Secondary | ICD-10-CM

## 2022-10-24 DIAGNOSIS — N183 Chronic kidney disease, stage 3 unspecified: Secondary | ICD-10-CM | POA: Diagnosis not present

## 2022-10-24 DIAGNOSIS — N1832 Chronic kidney disease, stage 3b: Secondary | ICD-10-CM

## 2022-10-24 DIAGNOSIS — I130 Hypertensive heart and chronic kidney disease with heart failure and stage 1 through stage 4 chronic kidney disease, or unspecified chronic kidney disease: Secondary | ICD-10-CM | POA: Diagnosis not present

## 2022-10-24 LAB — CBC WITH DIFFERENTIAL/PLATELET
Abs Immature Granulocytes: 0.01 10*3/uL (ref 0.00–0.07)
Basophils Absolute: 0 10*3/uL (ref 0.0–0.1)
Basophils Relative: 1 %
Eosinophils Absolute: 0.1 10*3/uL (ref 0.0–0.5)
Eosinophils Relative: 1 %
HCT: 32.1 % — ABNORMAL LOW (ref 39.0–52.0)
Hemoglobin: 9.9 g/dL — ABNORMAL LOW (ref 13.0–17.0)
Immature Granulocytes: 0 %
Lymphocytes Relative: 14 %
Lymphs Abs: 0.6 10*3/uL — ABNORMAL LOW (ref 0.7–4.0)
MCH: 28.9 pg (ref 26.0–34.0)
MCHC: 30.8 g/dL (ref 30.0–36.0)
MCV: 93.9 fL (ref 80.0–100.0)
Monocytes Absolute: 0.4 10*3/uL (ref 0.1–1.0)
Monocytes Relative: 10 %
Neutro Abs: 3.2 10*3/uL (ref 1.7–7.7)
Neutrophils Relative %: 74 %
Platelets: 151 10*3/uL (ref 150–400)
RBC: 3.42 MIL/uL — ABNORMAL LOW (ref 4.22–5.81)
RDW: 16.3 % — ABNORMAL HIGH (ref 11.5–15.5)
WBC: 4.4 10*3/uL (ref 4.0–10.5)
nRBC: 0 % (ref 0.0–0.2)

## 2022-10-24 MED ORDER — DARBEPOETIN ALFA 150 MCG/0.3ML IJ SOSY
150.0000 ug | PREFILLED_SYRINGE | Freq: Once | INTRAMUSCULAR | Status: AC
  Administered 2022-10-24: 150 ug via SUBCUTANEOUS
  Filled 2022-10-24: qty 0.3

## 2022-10-24 NOTE — Progress Notes (Signed)
[place]  [phone] [phone]  ID: CHANC BERKES OB: 1935/03/02  MR#: [id number]  [id number]  Patient Care Team: Kirk Ruths, MD as PCP - General (Internal Medicine)  HPI: Bill Peterson is a 88 y.o. male with past medical history of hyperlipidemia, hypertension, diabetes, CHF and CKD stage IV was referred to hematology for further management of anemia.  Patient follows with Dr. Holley Raring for CKD.  Labs from 07/03/2022 showed hemoglobin 7.8, WBC 4.6 and platelet 192.  No recent iron studies.  Takes oral iron daily.  Patient was started on Aranesp on 08/01/2022.  He is tolerating well.  Received 1 dose of IV ferraheme in Dec 2023.   INTERVAL HISTORY-  Patient seen today for Aranesp injection. Reports improvement in energy levels after starting injections.  Tolerating well.   REVIEW OF SYSTEMS:   Review of Systems  Respiratory:  Negative for cough and shortness of breath.   Cardiovascular:  Negative for chest pain and palpitations.  Gastrointestinal:  Negative for abdominal pain, nausea and vomiting.  All other systems reviewed and are negative.   As per HPI. Otherwise, a complete review of systems is negative.  PAST MEDICAL HISTORY: Past Medical History:  Diagnosis Date   BPH (benign prostatic hyperplasia)    CHF (congestive heart failure) (HCC)    Diabetes mellitus without complication ([place])    Hyperlipidemia    Hypertension     PAST SURGICAL HISTORY: Past Surgical History:  Procedure Laterality Date   BACK SURGERY     BIOPSY PROSTATE  08/2018   CARDIAC SURGERY     STOMACH SURGERY      FAMILY HISTORY: Family History  Problem Relation Age of Onset   Heart disease Mother    Diabetes Father    Prostate cancer Neg Hx    Kidney cancer Neg Hx    Bladder Cancer Neg Hx     HEALTH MAINTENANCE: Social History   Tobacco Use   Smoking status: Never    Passive exposure: Never   Smokeless tobacco: Never  Vaping  Use   Vaping Use: Never used  Substance Use Topics   Alcohol use: No   Drug use: No     Allergies  Allergen Reactions   Heparin     REACTION: Unspecified   Repaglinide Other (See Comments)    Increased appetite Increased appetite Increased appetite     Current Outpatient Medications  Medication Sig Dispense Refill   aspirin 325 MG tablet Take 325 mg by mouth daily.     atorvastatin (LIPITOR) 20 MG tablet Take 20 mg by mouth daily.     calcitRIOL (ROCALTROL) 0.25 MCG capsule Take by mouth.     carvedilol (COREG) 12.5 MG tablet Take 6.25 mg by mouth 2 (two) times daily with a meal.     Cholecalciferol (VITAMIN D3) 10 MCG (400 UNIT) tablet Take 400 Units by mouth daily.     CINNAMON PO Take 1,000 tablets by mouth daily at 6 (six) AM.     Ferrous Sulfate (IRON) 325 (65 Fe) MG TABS Take 1 tablet by mouth daily.     Garlic 123XX123 MG CAPS Take 1,000 mg by mouth daily at 6 (six) AM.     Ginger, Zingiber officinalis, (GINGER ROOT) 550 MG CAPS Take 1 capsule by mouth daily at 6 (six) AM.     lisinopril (PRINIVIL,ZESTRIL) 20 MG tablet Take 10 mg by mouth daily.     magnesium oxide (MAG-OX) 400 MG tablet Take 500  mg by mouth daily.     Misc Natural Products (APPLE CIDER VINEGAR DIET PO) Take 450 mg by mouth daily.     Multiple Vitamin (MULTIVITAMIN) capsule Take 1 capsule by mouth daily.     omeprazole (PRILOSEC) 20 MG capsule Take 20 mg by mouth daily.     Potassium 99 MG TABS Take 99 mg by mouth daily as needed.     tamsulosin (FLOMAX) 0.4 MG CAPS capsule Take 1 capsule (0.4 mg total) by mouth 2 (two) times daily. 180 capsule 3   torsemide (DEMADEX) 20 MG tablet Take 20 mg by mouth daily.     Turmeric 500 MG CAPS Take 500 mg by mouth daily.     vitamin E 400 UNIT capsule Take 400 Units by mouth daily.     No current facility-administered medications for this visit.    OBJECTIVE: Vitals:   10/24/22 1048  BP: (!) 149/77  Pulse: 81  Resp: 20  Temp: 97.8 F (36.6 C)  SpO2: 100%      Body mass index is 29.96 kg/m.      General: Well-developed, well-nourished, no acute distress. Eyes: Pink conjunctiva, anicteric sclera. HEENT: Normocephalic, moist mucous membranes, clear oropharnyx. Lungs: Clear to auscultation bilaterally. Heart: Regular rate and rhythm. No rubs, murmurs, or gallops. Abdomen: Soft, nontender, nondistended. No organomegaly noted, normoactive bowel sounds. Musculoskeletal: No edema, cyanosis, or clubbing. Neuro: Alert, answering all questions appropriately. Cranial nerves grossly intact. Skin: No rashes or petechiae noted. Psych: Normal affect. Lymphatics: No cervical, calvicular, axillary or inguinal LAD.   LAB RESULTS:  Lab Results  Component Value Date   NA 141 09/06/2017   K 3.5 09/06/2017   CL 104 09/06/2017   CO2 28 09/06/2017   GLUCOSE 137 (H) 09/06/2017   BUN 23 (H) 09/06/2017   CREATININE 1.76 (H) 09/06/2017   CALCIUM 8.6 (L) 09/06/2017   PROT 5.7 (L) 11/04/2007   ALBUMIN 3.0 (L) 08/18/2017   AST 30 11/04/2007   ALT 23 11/04/2007   ALKPHOS 44 11/04/2007   BILITOT 0.3 11/04/2007   GFRNONAA 35 (L) 09/06/2017   GFRAA 40 (L) 09/06/2017    Lab Results  Component Value Date   WBC 4.4 10/24/2022   NEUTROABS 3.2 10/24/2022   HGB 9.9 (L) 10/24/2022   HCT 32.1 (L) 10/24/2022   MCV 93.9 10/24/2022   PLT 151 10/24/2022    Lab Results  Component Value Date   TIBC 288 07/25/2022   FERRITIN 37 07/25/2022   IRONPCTSAT 13 (L) 07/25/2022     STUDIES: No results found.  ASSESSMENT AND PLAN:   Bill Peterson is a 88 y.o. male with pmh of hyperlipidemia, hypertension, diabetes, CHF and CKD stage IV was referred to hematology for further management of anemia.  #Normocytic anemia  #CKD Stage IV #Iron deficiency -Anemia is likely secondary to chronic kidney disease.  B12 and folate normal.  Ferritin 36, saturation 13%.  Patient is taking oral iron daily.  Received 1 dose of IV Feraheme in December 2023.  Started on Aranesp 100  mics on 08/01/2022. Changed to 150 mcg every 4 weeks instead of 3 weeks for patient comfort. Hb improving. Recheck iron panel in 3 months.   # Hypertension # CAD status post PCI and stent # Moderate aortic stenosis # Diastolic CHF -Follows with cardiology Dr. Oralia Manis at [facility]. Stable.   #DM - per primary  Orders Placed This Encounter  Procedures   CBC with Differential/Platelet   Iron and TIBC   Ferritin  Hemoglobin and Hematocrit (Cancer Center Only)    RTC monthly for aranesp, H/H RTC in 3 months for md visit, labs, aranesp.   Patient expressed understanding and was in agreement with this plan. He also understands that He can call clinic at any time with any questions, concerns, or complaints.   I spent a total of 30 minutes reviewing chart data, face-to-face evaluation with the patient, counseling and coordination of care as detailed above.  Jane Canary, MD   10/24/2022 11:40 AM

## 2022-10-26 ENCOUNTER — Encounter (INDEPENDENT_AMBULATORY_CARE_PROVIDER_SITE_OTHER): Payer: Self-pay

## 2022-10-26 ENCOUNTER — Ambulatory Visit (INDEPENDENT_AMBULATORY_CARE_PROVIDER_SITE_OTHER): Payer: Medicare Other | Admitting: Nurse Practitioner

## 2022-10-26 VITALS — BP 159/79 | HR 81 | Resp 18

## 2022-10-26 DIAGNOSIS — L97201 Non-pressure chronic ulcer of unspecified calf limited to breakdown of skin: Secondary | ICD-10-CM

## 2022-10-26 NOTE — Progress Notes (Signed)
History of Present Illness  There is no documented history at this time  Assessments & Plan   There are no diagnoses linked to this encounter.    Additional instructions  Subjective:  Patient presents with venous ulcer of the Bilateral lower extremity.    Procedure:  3 layer unna wrap was placed Bilateral lower extremity.   Plan:   Follow up in one week.    With xeroform on posterior right lower leg

## 2022-11-02 ENCOUNTER — Ambulatory Visit (INDEPENDENT_AMBULATORY_CARE_PROVIDER_SITE_OTHER): Payer: Medicare Other | Admitting: Nurse Practitioner

## 2022-11-02 ENCOUNTER — Encounter (INDEPENDENT_AMBULATORY_CARE_PROVIDER_SITE_OTHER): Payer: Self-pay | Admitting: Nurse Practitioner

## 2022-11-02 ENCOUNTER — Other Ambulatory Visit (INDEPENDENT_AMBULATORY_CARE_PROVIDER_SITE_OTHER): Payer: Self-pay | Admitting: Nurse Practitioner

## 2022-11-02 DIAGNOSIS — L97201 Non-pressure chronic ulcer of unspecified calf limited to breakdown of skin: Secondary | ICD-10-CM

## 2022-11-02 NOTE — Progress Notes (Signed)
History of Present Illness  There is no documented history at this time  Assessments & Plan   There are no diagnoses linked to this encounter.    Additional instructions  Subjective:  Patient presents with venous ulcer of the Bilateral lower extremity.    Procedure:  3 layer unna wrap was placed Bilateral lower extremity.   Plan:   Follow up in one week.  With xeroform on right posterior lower leg

## 2022-11-07 ENCOUNTER — Encounter (INDEPENDENT_AMBULATORY_CARE_PROVIDER_SITE_OTHER): Payer: Self-pay | Admitting: Nurse Practitioner

## 2022-11-07 ENCOUNTER — Ambulatory Visit (INDEPENDENT_AMBULATORY_CARE_PROVIDER_SITE_OTHER): Payer: Medicare Other | Admitting: Nurse Practitioner

## 2022-11-07 VITALS — BP 138/83 | HR 76 | Resp 16

## 2022-11-07 DIAGNOSIS — L97201 Non-pressure chronic ulcer of unspecified calf limited to breakdown of skin: Secondary | ICD-10-CM | POA: Diagnosis not present

## 2022-11-07 NOTE — Progress Notes (Signed)
History of Present Illness  There is no documented history at this time  Assessments & Plan   There are no diagnoses linked to this encounter.    Additional instructions  Subjective:  Patient presents with venous ulcer of the Bilateral lower extremity.    Procedure:  3 layer unna wrap was placed Bilateral lower extremity.   Plan:   Follow up in one week.  

## 2022-11-09 ENCOUNTER — Encounter (INDEPENDENT_AMBULATORY_CARE_PROVIDER_SITE_OTHER): Payer: PRIVATE HEALTH INSURANCE

## 2022-11-09 LAB — AEROBIC CULTURE

## 2022-11-12 ENCOUNTER — Encounter (INDEPENDENT_AMBULATORY_CARE_PROVIDER_SITE_OTHER): Payer: Self-pay | Admitting: Nurse Practitioner

## 2022-11-16 ENCOUNTER — Ambulatory Visit (INDEPENDENT_AMBULATORY_CARE_PROVIDER_SITE_OTHER): Payer: Medicare Other | Admitting: Nurse Practitioner

## 2022-11-16 ENCOUNTER — Encounter (INDEPENDENT_AMBULATORY_CARE_PROVIDER_SITE_OTHER): Payer: Self-pay | Admitting: Nurse Practitioner

## 2022-11-16 VITALS — BP 142/81 | HR 87 | Resp 16 | Wt 185.0 lb

## 2022-11-16 DIAGNOSIS — I5032 Chronic diastolic (congestive) heart failure: Secondary | ICD-10-CM

## 2022-11-16 DIAGNOSIS — I10 Essential (primary) hypertension: Secondary | ICD-10-CM | POA: Diagnosis not present

## 2022-11-16 DIAGNOSIS — L97201 Non-pressure chronic ulcer of unspecified calf limited to breakdown of skin: Secondary | ICD-10-CM | POA: Diagnosis not present

## 2022-11-16 MED ORDER — CIPROFLOXACIN HCL 250 MG PO TABS: 750.0000 mg | ORAL_TABLET | Freq: Two times a day (BID) | ORAL | 0 refills | Status: DC

## 2022-11-23 ENCOUNTER — Encounter (INDEPENDENT_AMBULATORY_CARE_PROVIDER_SITE_OTHER): Payer: Self-pay

## 2022-11-23 ENCOUNTER — Ambulatory Visit (INDEPENDENT_AMBULATORY_CARE_PROVIDER_SITE_OTHER): Payer: Medicare Other | Admitting: Nurse Practitioner

## 2022-11-23 VITALS — BP 130/68 | HR 65 | Resp 18

## 2022-11-23 DIAGNOSIS — L97201 Non-pressure chronic ulcer of unspecified calf limited to breakdown of skin: Secondary | ICD-10-CM

## 2022-11-23 NOTE — Progress Notes (Unsigned)
History of Present Illness  There is no documented history at this time  Assessments & Plan   There are no diagnoses linked to this encounter.    Additional instructions  Subjective:  Patient presents with venous ulcer of the Bilateral lower extremity.    Procedure:  3 layer unna wrap was placed Bilateral lower extremity.   Plan:   Follow up in one week.  Xeroform posterior lower right leg

## 2022-11-29 ENCOUNTER — Encounter (INDEPENDENT_AMBULATORY_CARE_PROVIDER_SITE_OTHER): Payer: Self-pay | Admitting: Nurse Practitioner

## 2022-11-29 NOTE — Progress Notes (Signed)
Subjective:    Patient ID: Bill Peterson, male    DOB: [birth year], 88 y.o.   MRN: [id number] Chief Complaint  Patient presents with   Follow-up    Unna boot follow up    HPI  Review of Systems  Cardiovascular:  Positive for leg swelling.  Skin:  Positive for wound.  All other systems reviewed and are negative.      Objective:   Physical Exam Vitals reviewed.  HENT:     Head: Normocephalic.  Cardiovascular:     Rate and Rhythm: Normal rate.  Pulmonary:     Effort: Pulmonary effort is normal.  Musculoskeletal:     Right lower leg: Edema present.     Left lower leg: Edema present.  Skin:    General: Skin is warm and dry.  Neurological:     Mental Status: He is alert and oriented to person, place, and time.  Psychiatric:        Mood and Affect: Mood normal.        Behavior: Behavior normal.        Thought Content: Thought content normal.        Judgment: Judgment normal.     BP (!) 142/81 (BP Location: Right Arm)   Pulse 87   Resp 16   Wt 185 lb (83.9 kg)   BMI 29.86 kg/m   Past Medical History:  Diagnosis Date   BPH (benign prostatic hyperplasia)    CHF (congestive heart failure)    Diabetes mellitus without complication    Hyperlipidemia    Hypertension     Social History   Socioeconomic History   Marital status: Married    Spouse name: Not on file   Number of children: Not on file   Years of education: Not on file   Highest education level: Not on file  Occupational History   Occupation: retired  Tobacco Use   Smoking status: Never    Passive exposure: Never   Smokeless tobacco: Never  Vaping Use   Vaping Use: Never used  Substance and Sexual Activity   Alcohol use: No   Drug use: No   Sexual activity: Yes  Other Topics Concern   Not on file  Social History Narrative   Lives at home with his wife. Active at baseline   Social Determinants of Health   Financial Resource Strain: Low Risk  (10/15/2018)   Overall Financial Resource  Strain (CARDIA)    Difficulty of Paying Living Expenses: Not hard at all  Food Insecurity: No Food Insecurity (09/14/2017)   Hunger Vital Sign    Worried About Running Out of Food in the Last Year: Never true    Ran Out of Food in the Last Year: Never true  Transportation Needs: No Transportation Needs (09/14/2017)   PRAPARE - Hydrologist (Medical): No    Lack of Transportation (Non-Medical): No  Physical Activity: Unknown (10/15/2018)   Exercise Vital Sign    Days of Exercise per Week: 3 days    Minutes of Exercise per Session: Not on file  Stress: No Stress Concern Present (10/15/2018)   [place]    Feeling of Stress : Only a little  Social Connections: Unknown (10/15/2018)   Social Connection and Isolation Panel [NHANES]    Frequency of Communication with Friends and Family: Not on file    Frequency of Social Gatherings with Friends and Family: More than three times  a week    Attends Religious Services: Not on file    Active Member of Clubs or Organizations: Not on file    Attends Club or Organization Meetings: Not on file    Marital Status: Not on file  Intimate Partner Violence: Not At Risk (09/14/2017)   Humiliation, Afraid, Rape, and Kick questionnaire    Fear of Current or Ex-Partner: No    Emotionally Abused: No    Physically Abused: No    Sexually Abused: No    Past Surgical History:  Procedure Laterality Date   BACK SURGERY     BIOPSY PROSTATE  08/2018   CARDIAC SURGERY     STOMACH SURGERY      Family History  Problem Relation Age of Onset   Heart disease Mother    Diabetes Father    Prostate cancer Neg Hx    Kidney cancer Neg Hx    Bladder Cancer Neg Hx     Allergies  Allergen Reactions   Heparin     REACTION: Unspecified   Repaglinide Other (See Comments)    Increased appetite Increased appetite Increased appetite        Latest Ref Rng & Units 10/24/2022    10:27 AM 10/03/2022   10:51 AM 09/12/2022   10:19 AM  CBC  WBC 4.0 - 10.5 K/uL 4.4   3.5   Hemoglobin 13.0 - 17.0 g/dL 9.9  9.7  9.4   Hematocrit 39.0 - 52.0 % 32.1  31.9  30.8   Platelets 150 - 400 K/uL 151   115       CMP     Component Value Date/Time   NA 141 09/06/2017 0645   K 3.5 09/06/2017 0645   CL 104 09/06/2017 0645   CO2 28 09/06/2017 0645   GLUCOSE 137 (H) 09/06/2017 0645   BUN 23 (H) 09/06/2017 0645   CREATININE 1.76 (H) 09/06/2017 0645   CALCIUM 8.6 (L) 09/06/2017 0645   PROT 5.7 (L) 11/04/2007 0400   ALBUMIN 3.0 (L) 08/18/2017 0500   AST 30 11/04/2007 0400   ALT 23 11/04/2007 0400   ALKPHOS 44 11/04/2007 0400   BILITOT 0.3 11/04/2007 0400   GFRNONAA 35 (L) 09/06/2017 0645   GFRAA 40 (L) 09/06/2017 0645     No results found.     Assessment & Plan:   1. Skin ulcer of calf, limited to breakdown of skin, unspecified laterality ([place]) I reviewed discussion with the patient regarding wound cultures and what this means in terms of antibiotics and wound healing.  Based upon the patient's wound cultures he has multiple organisms and 1 organism is resistant to ciprofloxacin which is the only oral antibiotic available for treatment of Pseudomonas.  I instructed the patient that he will need IV antibiotics via PICC line.  The patient is against this idea especially in the setting that he will need home health nurses with wound dressing changes.  I discussed that treatment with oral antibiotics that have been shown to be resistant may actually result in worsening of the wound and possible sepsis.  Despite this patient is adamant to try oral antibiotics prior to trying to utilize any PICC line antibiotics.  Will continue to have the patient present weekly from Unna boots and maintain close eye on his wounds and development.  2. Primary hypertension Continue antihypertensive medications as already ordered, these medications have been reviewed and there are no changes at this  time.  3. Chronic diastolic heart failure ([place]) This likely also  worsens his lower extremity edema   Current Outpatient Medications on File Prior to Visit  Medication Sig Dispense Refill   aspirin 325 MG tablet Take 325 mg by mouth daily.     atorvastatin (LIPITOR) 20 MG tablet Take 20 mg by mouth daily.     calcitRIOL (ROCALTROL) 0.25 MCG capsule Take by mouth.     carvedilol (COREG) 12.5 MG tablet Take 6.25 mg by mouth 2 (two) times daily with a meal.     Cholecalciferol (VITAMIN D3) 10 MCG (400 UNIT) tablet Take 400 Units by mouth daily.     CINNAMON PO Take 1,000 tablets by mouth daily at 6 (six) AM.     Ferrous Sulfate (IRON) 325 (65 Fe) MG TABS Take 1 tablet by mouth daily.     Garlic 123XX123 MG CAPS Take 1,000 mg by mouth daily at 6 (six) AM.     Ginger, Zingiber officinalis, (GINGER ROOT) 550 MG CAPS Take 1 capsule by mouth daily at 6 (six) AM.     lisinopril (PRINIVIL,ZESTRIL) 20 MG tablet Take 10 mg by mouth daily.     magnesium oxide (MAG-OX) 400 MG tablet Take 500 mg by mouth daily.     Misc Natural Products (APPLE CIDER VINEGAR DIET PO) Take 450 mg by mouth daily.     Multiple Vitamin (MULTIVITAMIN) capsule Take 1 capsule by mouth daily.     omeprazole (PRILOSEC) 20 MG capsule Take 20 mg by mouth daily.     Potassium 99 MG TABS Take 99 mg by mouth daily as needed.     tamsulosin (FLOMAX) 0.4 MG CAPS capsule Take 1 capsule (0.4 mg total) by mouth 2 (two) times daily. 180 capsule 3   torsemide (DEMADEX) 20 MG tablet Take 20 mg by mouth daily.     Turmeric 500 MG CAPS Take 500 mg by mouth daily.     vitamin E 400 UNIT capsule Take 400 Units by mouth daily.     No current facility-administered medications on file prior to visit.    There are no Patient Instructions on file for this visit. No follow-ups on file.   Kris Hartmann, NP

## 2022-11-30 ENCOUNTER — Ambulatory Visit (INDEPENDENT_AMBULATORY_CARE_PROVIDER_SITE_OTHER): Payer: Medicare Other | Admitting: Nurse Practitioner

## 2022-11-30 VITALS — BP 152/76 | HR 90 | Resp 18

## 2022-11-30 DIAGNOSIS — L97201 Non-pressure chronic ulcer of unspecified calf limited to breakdown of skin: Secondary | ICD-10-CM

## 2022-11-30 NOTE — Progress Notes (Signed)
History of Present Illness  There is no documented history at this time  Assessments & Plan   There are no diagnoses linked to this encounter.    Additional instructions  Subjective:  Patient presents with venous ulcer of the Bilateral lower extremity.    Procedure:  3 layer unna wrap was placed Bilateral lower extremity.   Plan:   Follow up in one week.  Xeroform on left lower anterior leg, and lower posterior right leg

## 2022-12-07 ENCOUNTER — Encounter (INDEPENDENT_AMBULATORY_CARE_PROVIDER_SITE_OTHER): Payer: Self-pay

## 2022-12-07 ENCOUNTER — Ambulatory Visit (INDEPENDENT_AMBULATORY_CARE_PROVIDER_SITE_OTHER): Payer: Medicare Other | Admitting: Nurse Practitioner

## 2022-12-07 VITALS — BP 134/72 | HR 87 | Resp 18

## 2022-12-07 DIAGNOSIS — L97201 Non-pressure chronic ulcer of unspecified calf limited to breakdown of skin: Secondary | ICD-10-CM | POA: Diagnosis not present

## 2022-12-07 NOTE — Progress Notes (Signed)
History of Present Illness  There is no documented history at this time  Assessments & Plan   There are no diagnoses linked to this encounter.    Additional instructions  Subjective:  Patient presents with venous ulcer of the Bilateral lower extremity.    Procedure:  3 layer unna wrap was placed Bilateral lower extremity.   Plan:   Follow up in one week.  Zeroform on anterior upper left lower leg

## 2022-12-14 ENCOUNTER — Ambulatory Visit (INDEPENDENT_AMBULATORY_CARE_PROVIDER_SITE_OTHER): Payer: Medicare Other | Admitting: Vascular Surgery

## 2022-12-14 ENCOUNTER — Encounter (INDEPENDENT_AMBULATORY_CARE_PROVIDER_SITE_OTHER): Payer: Self-pay

## 2022-12-14 VITALS — BP 122/62 | HR 80 | Resp 18

## 2022-12-14 DIAGNOSIS — I83009 Varicose veins of unspecified lower extremity with ulcer of unspecified site: Secondary | ICD-10-CM | POA: Diagnosis not present

## 2022-12-14 DIAGNOSIS — L97909 Non-pressure chronic ulcer of unspecified part of unspecified lower leg with unspecified severity: Secondary | ICD-10-CM

## 2022-12-14 NOTE — Progress Notes (Signed)
History of Present Illness  There is no documented history at this time  Assessments & Plan   There are no diagnoses linked to this encounter.    Additional instructions  Subjective:  Patient presents with venous ulcer of the Bilateral lower extremity.    Procedure:  3 layer unna wrap was placed Bilateral lower extremity.   Plan:   Follow up in one week.  Xeroform on left lower leg

## 2022-12-18 ENCOUNTER — Encounter (INDEPENDENT_AMBULATORY_CARE_PROVIDER_SITE_OTHER): Payer: Self-pay | Admitting: Nurse Practitioner

## 2022-12-21 ENCOUNTER — Ambulatory Visit (INDEPENDENT_AMBULATORY_CARE_PROVIDER_SITE_OTHER): Payer: Medicare Other | Admitting: Nurse Practitioner

## 2022-12-21 VITALS — BP 103/63 | HR 76 | Resp 17 | Ht 66.0 in | Wt 178.0 lb

## 2022-12-21 DIAGNOSIS — L97909 Non-pressure chronic ulcer of unspecified part of unspecified lower leg with unspecified severity: Secondary | ICD-10-CM

## 2022-12-21 DIAGNOSIS — I83009 Varicose veins of unspecified lower extremity with ulcer of unspecified site: Secondary | ICD-10-CM | POA: Diagnosis not present

## 2022-12-21 DIAGNOSIS — I10 Essential (primary) hypertension: Secondary | ICD-10-CM

## 2022-12-21 DIAGNOSIS — N183 Chronic kidney disease, stage 3 unspecified: Secondary | ICD-10-CM

## 2022-12-21 DIAGNOSIS — E1122 Type 2 diabetes mellitus with diabetic chronic kidney disease: Secondary | ICD-10-CM

## 2022-12-28 ENCOUNTER — Ambulatory Visit (INDEPENDENT_AMBULATORY_CARE_PROVIDER_SITE_OTHER): Payer: Medicare Other | Admitting: Nurse Practitioner

## 2022-12-28 ENCOUNTER — Encounter (INDEPENDENT_AMBULATORY_CARE_PROVIDER_SITE_OTHER): Payer: Self-pay | Admitting: Nurse Practitioner

## 2022-12-28 VITALS — BP 110/78 | HR 72 | Resp 16 | Ht 66.0 in | Wt 183.0 lb

## 2022-12-28 DIAGNOSIS — L97909 Non-pressure chronic ulcer of unspecified part of unspecified lower leg with unspecified severity: Secondary | ICD-10-CM

## 2022-12-28 DIAGNOSIS — I83009 Varicose veins of unspecified lower extremity with ulcer of unspecified site: Secondary | ICD-10-CM

## 2022-12-28 NOTE — Progress Notes (Signed)
History of Present Illness  There is no documented history at this time  Assessments & Plan   There are no diagnoses linked to this encounter.    Additional instructions  Subjective:  Patient presents with venous ulcer of the Bilateral lower extremity.    Procedure:  3 layer unna wrap was placed Bilateral lower extremity.   Plan:   Follow up in one week.  

## 2023-01-01 ENCOUNTER — Encounter (INDEPENDENT_AMBULATORY_CARE_PROVIDER_SITE_OTHER): Payer: Self-pay | Admitting: Nurse Practitioner

## 2023-01-01 NOTE — Progress Notes (Signed)
Subjective:    Patient ID: Bill Peterson, male    DOB: Jan 21, 1935, 88 y.o.   MRN: [id number] Chief Complaint  Patient presents with   Follow-up    The patient returns today for evaluation of his bilateral lower extremity edema and venous ulcerations.  There was concern that the patient may need IV antibiotics based on his wound cultures and his drainage areas.  Today the drainage has improved and the ulcerations are looking better as well.  He still does have continued swelling in his bilateral lower extremities however    Review of Systems  Cardiovascular:  Positive for leg swelling.  Skin:  Positive for wound.  All other systems reviewed and are negative.      Objective:   Physical Exam Vitals reviewed.  HENT:     Head: Normocephalic.  Cardiovascular:     Rate and Rhythm: Normal rate.  Pulmonary:     Effort: Pulmonary effort is normal.  Musculoskeletal:     Right lower leg: Edema present.     Left lower leg: Edema present.  Skin:    General: Skin is warm and dry.  Neurological:     Mental Status: He is alert and oriented to person, place, and time.  Psychiatric:        Mood and Affect: Mood normal.        Behavior: Behavior normal.        Thought Content: Thought content normal.        Judgment: Judgment normal.     BP 103/63 (BP Location: Right Arm)   Pulse 76   Resp 17   Ht 5\' 6"  (1.676 m)   Wt 178 lb (80.7 kg)   BMI 28.73 kg/m   Past Medical History:  Diagnosis Date   BPH (benign prostatic hyperplasia)    CHF (congestive heart failure) (HCC)    Diabetes mellitus without complication (HCC)    Hyperlipidemia    Hypertension     Social History   Socioeconomic History   Marital status: Married    Spouse name: Not on file   Number of children: Not on file   Years of education: Not on file   Highest education level: Not on file  Occupational History   Occupation: retired  Tobacco Use   Smoking status: Never    Passive exposure: Never    Smokeless tobacco: Never  Vaping Use   Vaping Use: Never used  Substance and Sexual Activity   Alcohol use: No   Drug use: No   Sexual activity: Yes  Other Topics Concern   Not on file  Social History Narrative   Lives at home with his wife. Active at baseline   Social Determinants of Health   Financial Resource Strain: Low Risk  (10/15/2018)   Overall Financial Resource Strain (CARDIA)    Difficulty of Paying Living Expenses: Not hard at all  Food Insecurity: No Food Insecurity (09/14/2017)   Hunger Vital Sign    Worried About Running Out of Food in the Last Year: Never true    Ran Out of Food in the Last Year: Never true  Transportation Needs: No Transportation Needs (09/14/2017)   PRAPARE - Administrator, Civil Service (Medical): No    Lack of Transportation (Non-Medical): No  Physical Activity: Unknown (10/15/2018)   Exercise Vital Sign    Days of Exercise per Week: 3 days    Minutes of Exercise per Session: Not on file  Stress: No Stress Concern  Present (10/15/2018)   [employer] of Occupational Health - Occupational Stress Questionnaire    Feeling of Stress : Only a little  Social Connections: Unknown (10/15/2018)   Social Connection and Isolation Panel [NHANES]    Frequency of Communication with Friends and Family: Not on file    Frequency of Social Gatherings with Friends and Family: More than three times a week    Attends Religious Services: Not on file    Active Member of Clubs or Organizations: Not on file    Attends Banker Meetings: Not on file    Marital Status: Not on file  Intimate Partner Violence: Not At Risk (09/14/2017)   Humiliation, Afraid, Rape, and Kick questionnaire    Fear of Current or Ex-Partner: No    Emotionally Abused: No    Physically Abused: No    Sexually Abused: No    Past Surgical History:  Procedure Laterality Date   BACK SURGERY     BIOPSY PROSTATE  08/2018   CARDIAC SURGERY     STOMACH SURGERY       Family History  Problem Relation Age of Onset   Heart disease Mother    Diabetes Father    Prostate cancer Neg Hx    Kidney cancer Neg Hx    Bladder Cancer Neg Hx     Allergies  Allergen Reactions   Heparin     REACTION: Unspecified   Repaglinide Other (See Comments)    Increased appetite Increased appetite Increased appetite        Latest Ref Rng & Units 10/24/2022   10:27 AM 10/03/2022   10:51 AM 09/12/2022   10:19 AM  CBC  WBC 4.0 - 10.5 K/uL 4.4   3.5   Hemoglobin 13.0 - 17.0 g/dL 9.9  9.7  9.4   Hematocrit 39.0 - 52.0 % 32.1  31.9  30.8   Platelets 150 - 400 K/uL 151   115       CMP     Component Value Date/Time   NA 141 09/06/2017 0645   K 3.5 09/06/2017 0645   CL 104 09/06/2017 0645   CO2 28 09/06/2017 0645   GLUCOSE 137 (H) 09/06/2017 0645   BUN 23 (H) 09/06/2017 0645   CREATININE 1.76 (H) 09/06/2017 0645   CALCIUM 8.6 (L) 09/06/2017 0645   PROT 5.7 (L) 11/04/2007 0400   ALBUMIN 3.0 (L) 08/18/2017 0500   AST 30 11/04/2007 0400   ALT 23 11/04/2007 0400   ALKPHOS 44 11/04/2007 0400   BILITOT 0.3 11/04/2007 0400   GFRNONAA 35 (L) 09/06/2017 0645   GFRAA 40 (L) 09/06/2017 0645     No results found.     Assessment & Plan:   1. Venous ulcer (HCC) The patient's wounds and drainage have improved.  He currently still has some lower extremity swelling but this is again related to his other murmurs, ability in addition to his lymphedema.  He has been tolerating his compression wraps well.  We will continue to have the patient remain in wrap so that he can heal the remaining small venous ulcerations.  2. Diabetes mellitus with stage 3 chronic kidney disease (HCC) Continue hypoglycemic medications as already ordered, these medications have been reviewed and there are no changes at this time.  Hgb A1C to be monitored as already arranged by primary service  3. Primary hypertension Continue antihypertensive medications as already ordered, these  medications have been reviewed and there are no changes at this time.   Current  Outpatient Medications on File Prior to Visit  Medication Sig Dispense Refill   aspirin 325 MG tablet Take 325 mg by mouth daily.     atorvastatin (LIPITOR) 20 MG tablet Take 20 mg by mouth daily.     calcitRIOL (ROCALTROL) 0.25 MCG capsule Take by mouth.     carvedilol (COREG) 12.5 MG tablet Take 6.25 mg by mouth 2 (two) times daily with a meal.     Cholecalciferol (VITAMIN D3) 10 MCG (400 UNIT) tablet Take 400 Units by mouth daily.     CINNAMON PO Take 1,000 tablets by mouth daily at 6 (six) AM.     ciprofloxacin (CIPRO) 250 MG tablet Take 3 tablets (750 mg total) by mouth 2 (two) times daily. 60 tablet 0   Ferrous Sulfate (IRON) 325 (65 Fe) MG TABS Take 1 tablet by mouth daily.     Garlic 1000 MG CAPS Take 1,000 mg by mouth daily at 6 (six) AM.     Ginger, Zingiber officinalis, (GINGER ROOT) 550 MG CAPS Take 1 capsule by mouth daily at 6 (six) AM.     lisinopril (PRINIVIL,ZESTRIL) 20 MG tablet Take 10 mg by mouth daily.     magnesium oxide (MAG-OX) 400 MG tablet Take 500 mg by mouth daily.     Misc Natural Products (APPLE CIDER VINEGAR DIET PO) Take 450 mg by mouth daily.     Multiple Vitamin (MULTIVITAMIN) capsule Take 1 capsule by mouth daily.     omeprazole (PRILOSEC) 20 MG capsule Take 20 mg by mouth daily.     Potassium 99 MG TABS Take 99 mg by mouth daily as needed.     tamsulosin (FLOMAX) 0.4 MG CAPS capsule Take 1 capsule (0.4 mg total) by mouth 2 (two) times daily. 180 capsule 3   torsemide (DEMADEX) 20 MG tablet Take 20 mg by mouth daily.     Turmeric 500 MG CAPS Take 500 mg by mouth daily.     vitamin E 400 UNIT capsule Take 400 Units by mouth daily.     No current facility-administered medications on file prior to visit.    There are no Patient Instructions on file for this visit. No follow-ups on file.   Georgiana Spinner, NP

## 2023-01-04 ENCOUNTER — Encounter (INDEPENDENT_AMBULATORY_CARE_PROVIDER_SITE_OTHER): Payer: Self-pay | Admitting: Nurse Practitioner

## 2023-01-04 ENCOUNTER — Ambulatory Visit (INDEPENDENT_AMBULATORY_CARE_PROVIDER_SITE_OTHER): Payer: Medicare Other | Admitting: Nurse Practitioner

## 2023-01-04 VITALS — BP 117/67 | HR 68 | Resp 17 | Ht 66.0 in | Wt 178.0 lb

## 2023-01-04 DIAGNOSIS — L97909 Non-pressure chronic ulcer of unspecified part of unspecified lower leg with unspecified severity: Secondary | ICD-10-CM

## 2023-01-04 DIAGNOSIS — I83009 Varicose veins of unspecified lower extremity with ulcer of unspecified site: Secondary | ICD-10-CM

## 2023-01-04 NOTE — Progress Notes (Signed)
History of Present Illness  There is no documented history at this time  Assessments & Plan   There are no diagnoses linked to this encounter.    Additional instructions  Subjective:  Patient presents with venous ulcer of the Bilateral lower extremity.    Procedure:  3 layer unna wrap was placed Bilateral lower extremity.   Plan:   Follow up in one week.  

## 2023-01-11 ENCOUNTER — Ambulatory Visit (INDEPENDENT_AMBULATORY_CARE_PROVIDER_SITE_OTHER): Payer: Medicare Other | Admitting: Nurse Practitioner

## 2023-01-11 ENCOUNTER — Encounter (INDEPENDENT_AMBULATORY_CARE_PROVIDER_SITE_OTHER): Payer: Self-pay | Admitting: Nurse Practitioner

## 2023-01-11 VITALS — BP 120/72 | HR 76 | Resp 18 | Ht 66.0 in | Wt 179.0 lb

## 2023-01-11 DIAGNOSIS — L97909 Non-pressure chronic ulcer of unspecified part of unspecified lower leg with unspecified severity: Secondary | ICD-10-CM | POA: Diagnosis not present

## 2023-01-11 DIAGNOSIS — I83009 Varicose veins of unspecified lower extremity with ulcer of unspecified site: Secondary | ICD-10-CM | POA: Diagnosis not present

## 2023-01-11 NOTE — Progress Notes (Signed)
History of Present Illness  There is no documented history at this time  Assessments & Plan   There are no diagnoses linked to this encounter.    Additional instructions  Subjective:  Patient presents with venous ulcer of the Bilateral lower extremity.    Procedure:  3 layer unna wrap was placed Bilateral lower extremity.   Plan:   Follow up in one week.  

## 2023-01-18 ENCOUNTER — Ambulatory Visit (INDEPENDENT_AMBULATORY_CARE_PROVIDER_SITE_OTHER): Payer: Medicare Other | Admitting: Nurse Practitioner

## 2023-01-18 ENCOUNTER — Encounter (INDEPENDENT_AMBULATORY_CARE_PROVIDER_SITE_OTHER): Payer: Self-pay

## 2023-01-18 VITALS — BP 110/62 | HR 76 | Resp 16

## 2023-01-18 DIAGNOSIS — I890 Lymphedema, not elsewhere classified: Secondary | ICD-10-CM | POA: Diagnosis not present

## 2023-01-18 NOTE — Progress Notes (Signed)
History of Present Illness  There is no documented history at this time  Assessments & Plan   There are no diagnoses linked to this encounter.    Additional instructions  Subjective:  Patient presents with venous ulcer of the Bilateral lower extremity.    Procedure:  3 layer unna wrap was placed Bilateral lower extremity.   Plan:   Follow up in one week.  

## 2023-01-19 ENCOUNTER — Encounter (INDEPENDENT_AMBULATORY_CARE_PROVIDER_SITE_OTHER): Payer: Self-pay | Admitting: Nurse Practitioner

## 2023-01-23 ENCOUNTER — Inpatient Hospital Stay: Payer: Medicare Other | Attending: Internal Medicine

## 2023-01-23 ENCOUNTER — Encounter: Payer: Self-pay | Admitting: Internal Medicine

## 2023-01-23 ENCOUNTER — Inpatient Hospital Stay: Payer: Medicare Other

## 2023-01-23 ENCOUNTER — Inpatient Hospital Stay (HOSPITAL_BASED_OUTPATIENT_CLINIC_OR_DEPARTMENT_OTHER): Payer: Medicare Other | Admitting: Internal Medicine

## 2023-01-23 VITALS — BP 130/68 | HR 82 | Temp 96.9°F | Wt 183.9 lb

## 2023-01-23 DIAGNOSIS — N1832 Chronic kidney disease, stage 3b: Secondary | ICD-10-CM

## 2023-01-23 DIAGNOSIS — N189 Chronic kidney disease, unspecified: Secondary | ICD-10-CM

## 2023-01-23 DIAGNOSIS — I2510 Atherosclerotic heart disease of native coronary artery without angina pectoris: Secondary | ICD-10-CM | POA: Diagnosis not present

## 2023-01-23 DIAGNOSIS — Z79899 Other long term (current) drug therapy: Secondary | ICD-10-CM | POA: Insufficient documentation

## 2023-01-23 DIAGNOSIS — I130 Hypertensive heart and chronic kidney disease with heart failure and stage 1 through stage 4 chronic kidney disease, or unspecified chronic kidney disease: Secondary | ICD-10-CM | POA: Insufficient documentation

## 2023-01-23 DIAGNOSIS — E611 Iron deficiency: Secondary | ICD-10-CM | POA: Diagnosis not present

## 2023-01-23 DIAGNOSIS — I5030 Unspecified diastolic (congestive) heart failure: Secondary | ICD-10-CM | POA: Diagnosis not present

## 2023-01-23 DIAGNOSIS — E1122 Type 2 diabetes mellitus with diabetic chronic kidney disease: Secondary | ICD-10-CM | POA: Diagnosis not present

## 2023-01-23 DIAGNOSIS — D631 Anemia in chronic kidney disease: Secondary | ICD-10-CM | POA: Diagnosis not present

## 2023-01-23 DIAGNOSIS — D649 Anemia, unspecified: Secondary | ICD-10-CM | POA: Diagnosis not present

## 2023-01-23 DIAGNOSIS — I350 Nonrheumatic aortic (valve) stenosis: Secondary | ICD-10-CM | POA: Insufficient documentation

## 2023-01-23 DIAGNOSIS — N184 Chronic kidney disease, stage 4 (severe): Secondary | ICD-10-CM | POA: Insufficient documentation

## 2023-01-23 DIAGNOSIS — N183 Chronic kidney disease, stage 3 unspecified: Secondary | ICD-10-CM

## 2023-01-23 LAB — CBC WITH DIFFERENTIAL/PLATELET
Abs Immature Granulocytes: 0.02 10*3/uL (ref 0.00–0.07)
Basophils Absolute: 0 10*3/uL (ref 0.0–0.1)
Basophils Relative: 0 %
Eosinophils Absolute: 0 10*3/uL (ref 0.0–0.5)
Eosinophils Relative: 1 %
HCT: 26.3 % — ABNORMAL LOW (ref 39.0–52.0)
Hemoglobin: 8.5 g/dL — ABNORMAL LOW (ref 13.0–17.0)
Immature Granulocytes: 1 %
Lymphocytes Relative: 14 %
Lymphs Abs: 0.5 10*3/uL — ABNORMAL LOW (ref 0.7–4.0)
MCH: 30.4 pg (ref 26.0–34.0)
MCHC: 32.3 g/dL (ref 30.0–36.0)
MCV: 93.9 fL (ref 80.0–100.0)
Monocytes Absolute: 0.4 10*3/uL (ref 0.1–1.0)
Monocytes Relative: 11 %
Neutro Abs: 2.6 10*3/uL (ref 1.7–7.7)
Neutrophils Relative %: 73 %
Platelets: 128 10*3/uL — ABNORMAL LOW (ref 150–400)
RBC: 2.8 MIL/uL — ABNORMAL LOW (ref 4.22–5.81)
RDW: 19.1 % — ABNORMAL HIGH (ref 11.5–15.5)
WBC: 3.5 10*3/uL — ABNORMAL LOW (ref 4.0–10.5)
nRBC: 0 % (ref 0.0–0.2)

## 2023-01-23 LAB — IRON AND TIBC
Iron: 36 ug/dL — ABNORMAL LOW (ref 45–182)
Saturation Ratios: 13 % — ABNORMAL LOW (ref 17.9–39.5)
TIBC: 276 ug/dL (ref 250–450)
UIBC: 240 ug/dL

## 2023-01-23 LAB — FERRITIN: Ferritin: 44 ng/mL (ref 24–336)

## 2023-01-23 MED ORDER — DARBEPOETIN ALFA 150 MCG/0.3ML IJ SOSY
150.0000 ug | PREFILLED_SYRINGE | Freq: Once | INTRAMUSCULAR | Status: AC
  Administered 2023-01-23: 150 ug via SUBCUTANEOUS
  Filled 2023-01-23: qty 0.3

## 2023-01-23 NOTE — Progress Notes (Signed)
[facility]  [phone] [phone]  ID: Bill Peterson OB: [birth year]  MR#: [id number]  QIO#:[id number]  Patient Care Team: Lauro Regulus, MD as PCP - General (Internal Medicine) Michaelyn Barter, MD as Consulting Physician (Oncology)  HPI: Bill Peterson is a 88 y.o. male with past medical history of hyperlipidemia, hypertension, diabetes, CHF and CKD stage IV was referred to hematology for further management of anemia.  Patient follows with Dr. Cherylann Ratel for CKD.  Labs from 07/03/2022 showed hemoglobin 7.8, WBC 4.6 and platelet 192.  No recent iron studies.  Takes oral iron daily.  Patient was started on Aranesp on 08/01/2022.  He is tolerating well.  Received 1 dose of IV ferraheme in Dec 2023.  INTERVAL HISTORY-  Patient seen today for Aranesp injection. Reports improvement in energy levels after starting injections.  Tolerating well.   REVIEW OF SYSTEMS:   Review of Systems  Respiratory:  Negative for cough and shortness of breath.   Cardiovascular:  Negative for chest pain and palpitations.  Gastrointestinal:  Negative for abdominal pain, nausea and vomiting.  All other systems reviewed and are negative.   As per HPI. Otherwise, a complete review of systems is negative.  PAST MEDICAL HISTORY: Past Medical History:  Diagnosis Date   BPH (benign prostatic hyperplasia)    CHF (congestive heart failure) (HCC)    Diabetes mellitus without complication (HCC)    Hyperlipidemia    Hypertension     PAST SURGICAL HISTORY: Past Surgical History:  Procedure Laterality Date   BACK SURGERY     BIOPSY PROSTATE  08/2018   CARDIAC SURGERY     STOMACH SURGERY      FAMILY HISTORY: Family History  Problem Relation Age of Onset   Heart disease Mother    Diabetes Father    Prostate cancer Neg Hx    Kidney cancer Neg Hx    Bladder Cancer Neg Hx     HEALTH MAINTENANCE: Social History   Tobacco Use   Smoking status: Never    Passive  exposure: Never   Smokeless tobacco: Never  Vaping Use   Vaping Use: Never used  Substance Use Topics   Alcohol use: No   Drug use: No     Allergies  Allergen Reactions   Heparin     REACTION: Unspecified   Repaglinide Other (See Comments)    Increased appetite Increased appetite Increased appetite     Current Outpatient Medications  Medication Sig Dispense Refill   aspirin 325 MG tablet Take 325 mg by mouth daily.     atorvastatin (LIPITOR) 20 MG tablet Take 20 mg by mouth daily.     calcitRIOL (ROCALTROL) 0.25 MCG capsule Take by mouth.     carvedilol (COREG) 12.5 MG tablet Take 6.25 mg by mouth 2 (two) times daily with a meal.     Cholecalciferol (VITAMIN D3) 10 MCG (400 UNIT) tablet Take 400 Units by mouth daily.     CINNAMON PO Take 1,000 tablets by mouth daily at 6 (six) AM.     Ferrous Sulfate (IRON) 325 (65 Fe) MG TABS Take 1 tablet by mouth daily.     Garlic 1000 MG CAPS Take 1,000 mg by mouth daily at 6 (six) AM.     Ginger, Zingiber officinalis, (GINGER ROOT) 550 MG CAPS Take 1 capsule by mouth daily at 6 (six) AM.     lisinopril (PRINIVIL,ZESTRIL) 20 MG tablet Take 10 mg by mouth daily.     magnesium oxide (  MAG-OX) 400 MG tablet Take 500 mg by mouth daily.     Misc Natural Products (APPLE CIDER VINEGAR DIET PO) Take 450 mg by mouth daily.     Multiple Vitamin (MULTIVITAMIN) capsule Take 1 capsule by mouth daily.     omeprazole (PRILOSEC) 20 MG capsule Take 20 mg by mouth daily.     Potassium 99 MG TABS Take 99 mg by mouth daily as needed.     tamsulosin (FLOMAX) 0.4 MG CAPS capsule Take 1 capsule (0.4 mg total) by mouth 2 (two) times daily. 180 capsule 3   torsemide (DEMADEX) 20 MG tablet Take 20 mg by mouth daily.     Turmeric 500 MG CAPS Take 500 mg by mouth daily.     vitamin E 400 UNIT capsule Take 400 Units by mouth daily. (Patient not taking: Reported on 01/23/2023)     No current facility-administered medications for this visit.    OBJECTIVE: Vitals:    01/23/23 1045  BP: 130/68  Pulse: 82  Temp: (!) 96.9 F (36.1 C)  SpO2: 100%     Body mass index is 29.68 kg/m.      General: Well-developed, well-nourished, no acute distress. Eyes: Pink conjunctiva, anicteric sclera. HEENT: Normocephalic, moist mucous membranes, clear oropharnyx. Lungs: Clear to auscultation bilaterally. Heart: Regular rate and rhythm. No rubs, murmurs, or gallops. Abdomen: Soft, nontender, nondistended. No organomegaly noted, normoactive bowel sounds. Musculoskeletal: No edema, cyanosis, or clubbing. Neuro: Alert, answering all questions appropriately. Cranial nerves grossly intact. Skin: No rashes or petechiae noted. Psych: Normal affect. Lymphatics: No cervical, calvicular, axillary or inguinal LAD.   LAB RESULTS:  Lab Results  Component Value Date   NA 141 09/06/2017   K 3.5 09/06/2017   CL 104 09/06/2017   CO2 28 09/06/2017   GLUCOSE 137 (H) 09/06/2017   BUN 23 (H) 09/06/2017   CREATININE 1.76 (H) 09/06/2017   CALCIUM 8.6 (L) 09/06/2017   PROT 5.7 (L) 11/04/2007   ALBUMIN 3.0 (L) 08/18/2017   AST 30 11/04/2007   ALT 23 11/04/2007   ALKPHOS 44 11/04/2007   BILITOT 0.3 11/04/2007   GFRNONAA 35 (L) 09/06/2017   GFRAA 40 (L) 09/06/2017    Lab Results  Component Value Date   WBC 3.5 (L) 01/23/2023   NEUTROABS 2.6 01/23/2023   HGB 8.5 (L) 01/23/2023   HCT 26.3 (L) 01/23/2023   MCV 93.9 01/23/2023   PLT 128 (L) 01/23/2023    Lab Results  Component Value Date   TIBC 276 01/23/2023   TIBC 288 07/25/2022   FERRITIN 44 01/23/2023   FERRITIN 37 07/25/2022   IRONPCTSAT 13 (L) 01/23/2023   IRONPCTSAT 13 (L) 07/25/2022     STUDIES: No results found.  ASSESSMENT AND PLAN:   Bill Peterson is a 88 y.o. male with pmh of hyperlipidemia, hypertension, diabetes, CHF and CKD stage IV was referred to hematology for further management of anemia.  #Normocytic anemia  #CKD Stage IV #Iron deficiency -Anemia is likely secondary to chronic kidney  disease.  B12 and folate normal.  Ferritin 36, saturation 13%.  Patient is taking oral iron daily.  Received 1 dose of IV Feraheme in December 2023.  Started on Aranesp 100 mics on 08/01/2022. Changed to 150 mcg every 4 weeks instead of 3 weeks for patient comfort.  -I discussed with the patient that due to scheduling error, he was not scheduled for having missed injections since February.  He is globin has trended down to 8.5.  Will proceed with  Aranesp 150 mcg today.  He will get monthly Aranesp injection with labs.  Follow-up in 3 months.  Iron panel showing ferritin of 44 iron saturation of 13%.  I will advised the patient to resume his oral iron.  # Hypertension # CAD status post PCI and stent # Moderate aortic stenosis # Diastolic CHF -Follows with cardiology Dr. Alric Seton at [facility]. Stable.   #DM - per primary  Orders Placed This Encounter  Procedures   CBC with Differential/Platelet   Comprehensive metabolic panel    RTC monthly for aranesp, H/H RTC in 3 months for md visit, labs, aranesp.   Patient expressed understanding and was in agreement with this plan. He also understands that He can call clinic at any time with any questions, concerns, or complaints.   I spent a total of 25 minutes reviewing chart data, face-to-face evaluation with the patient, counseling and coordination of care as detailed above.  Michaelyn Barter, MD   01/23/2023 3:42 PM

## 2023-01-23 NOTE — Progress Notes (Signed)
Patient's Cardiologist told him that he is probably getting ready to have some work done on a valve that isn't working properly in his heart.

## 2023-01-25 ENCOUNTER — Ambulatory Visit (INDEPENDENT_AMBULATORY_CARE_PROVIDER_SITE_OTHER): Payer: Medicare Other | Admitting: Nurse Practitioner

## 2023-01-25 ENCOUNTER — Encounter (INDEPENDENT_AMBULATORY_CARE_PROVIDER_SITE_OTHER): Payer: Self-pay

## 2023-01-25 VITALS — BP 121/67 | HR 84 | Resp 16

## 2023-01-25 DIAGNOSIS — I890 Lymphedema, not elsewhere classified: Secondary | ICD-10-CM

## 2023-01-25 NOTE — Progress Notes (Signed)
History of Present Illness  There is no documented history at this time  Assessments & Plan   There are no diagnoses linked to this encounter.    Additional instructions  Subjective:  Patient presents with venous ulcer of the Bilateral lower extremity.    Procedure:  3 layer unna wrap was placed Bilateral lower extremity.   Plan:   Follow up in one week.  

## 2023-02-01 ENCOUNTER — Ambulatory Visit (INDEPENDENT_AMBULATORY_CARE_PROVIDER_SITE_OTHER): Payer: Medicare Other | Admitting: Nurse Practitioner

## 2023-02-01 ENCOUNTER — Encounter (INDEPENDENT_AMBULATORY_CARE_PROVIDER_SITE_OTHER): Payer: Self-pay

## 2023-02-01 VITALS — BP 114/67 | HR 74 | Resp 18

## 2023-02-01 DIAGNOSIS — I890 Lymphedema, not elsewhere classified: Secondary | ICD-10-CM

## 2023-02-01 NOTE — Progress Notes (Signed)
History of Present Illness  There is no documented history at this time  Assessments & Plan   There are no diagnoses linked to this encounter.    Additional instructions  Subjective:  Patient presents with venous ulcer of the Bilateral lower extremity.    Procedure:  3 layer unna wrap was placed Bilateral lower extremity.   Plan:   Follow up in one week.  

## 2023-02-08 ENCOUNTER — Encounter (INDEPENDENT_AMBULATORY_CARE_PROVIDER_SITE_OTHER): Payer: Self-pay | Admitting: Nurse Practitioner

## 2023-02-08 ENCOUNTER — Ambulatory Visit (INDEPENDENT_AMBULATORY_CARE_PROVIDER_SITE_OTHER): Payer: Medicare Other | Admitting: Nurse Practitioner

## 2023-02-08 ENCOUNTER — Encounter (INDEPENDENT_AMBULATORY_CARE_PROVIDER_SITE_OTHER): Payer: PRIVATE HEALTH INSURANCE

## 2023-02-08 VITALS — BP 114/66 | HR 78 | Resp 18 | Ht 68.6 in | Wt 176.6 lb

## 2023-02-08 DIAGNOSIS — L97909 Non-pressure chronic ulcer of unspecified part of unspecified lower leg with unspecified severity: Secondary | ICD-10-CM | POA: Diagnosis not present

## 2023-02-08 DIAGNOSIS — E1122 Type 2 diabetes mellitus with diabetic chronic kidney disease: Secondary | ICD-10-CM | POA: Diagnosis not present

## 2023-02-08 DIAGNOSIS — I83009 Varicose veins of unspecified lower extremity with ulcer of unspecified site: Secondary | ICD-10-CM | POA: Diagnosis not present

## 2023-02-08 DIAGNOSIS — N183 Chronic kidney disease, stage 3 unspecified: Secondary | ICD-10-CM

## 2023-02-08 DIAGNOSIS — I890 Lymphedema, not elsewhere classified: Secondary | ICD-10-CM | POA: Diagnosis not present

## 2023-02-08 NOTE — Progress Notes (Signed)
Subjective:    Patient ID: Bill Peterson, male    DOB: 1935/02/24, 88 y.o.   MRN: [id number] Chief Complaint  Patient presents with   Follow-up    f/u unnaboot    The patient returns today for evaluation of his lower extremities after several weeks of Unna boots.  The swelling is much improved as the integrity of the skin however in the posterior calf.  He has developed a small ulcer.  Otherwise he denies any significant issues with his legs.  The patient does have stage IV chronic kidney disease in addition to known valvular issues.  This also likely complicates the patient's overall lower extremity edema.    Review of Systems  Cardiovascular:  Positive for leg swelling.  Skin:  Positive for wound.  All other systems reviewed and are negative.      Objective:   Physical Exam Vitals reviewed.  HENT:     Head: Normocephalic.  Cardiovascular:     Rate and Rhythm: Normal rate.  Pulmonary:     Effort: Pulmonary effort is normal.  Musculoskeletal:     Right lower leg: 2+ Edema present.     Left lower leg: 1+ Edema present.  Skin:    General: Skin is warm and dry.  Neurological:     Mental Status: He is alert and oriented to person, place, and time.  Psychiatric:        Mood and Affect: Mood normal.        Behavior: Behavior normal.        Thought Content: Thought content normal.        Judgment: Judgment normal.     BP 114/66 (BP Location: Left Arm)   Pulse 78   Resp 18   Ht 5' 8.6" (1.742 m)   Wt 176 lb 9.6 oz (80.1 kg)   BMI 26.38 kg/m   Past Medical History:  Diagnosis Date   BPH (benign prostatic hyperplasia)    CHF (congestive heart failure) (HCC)    Diabetes mellitus without complication (HCC)    Hyperlipidemia    Hypertension     Social History   Socioeconomic History   Marital status: Married    Spouse name: Not on file   Number of children: Not on file   Years of education: Not on file   Highest education level: Not on file  Occupational  History   Occupation: retired  Tobacco Use   Smoking status: Never    Passive exposure: Never   Smokeless tobacco: Never  Vaping Use   Vaping Use: Never used  Substance and Sexual Activity   Alcohol use: No   Drug use: No   Sexual activity: Yes  Other Topics Concern   Not on file  Social History Narrative   Lives at home with his wife. Active at baseline   Social Determinants of Health   Financial Resource Strain: Low Risk  (10/15/2018)   Overall Financial Resource Strain (CARDIA)    Difficulty of Paying Living Expenses: Not hard at all  Food Insecurity: No Food Insecurity (09/14/2017)   Hunger Vital Sign    Worried About Running Out of Food in the Last Year: Never true    Ran Out of Food in the Last Year: Never true  Transportation Needs: No Transportation Needs (09/14/2017)   PRAPARE - Administrator, Civil Service (Medical): No    Lack of Transportation (Non-Medical): No  Physical Activity: Unknown (10/15/2018)   Exercise Vital Sign  Days of Exercise per Week: 3 days    Minutes of Exercise per Session: Not on file  Stress: No Stress Concern Present (10/15/2018)   [employer] of Occupational Health - Occupational Stress Questionnaire    Feeling of Stress : Only a little  Social Connections: Unknown (10/15/2018)   Social Connection and Isolation Panel [NHANES]    Frequency of Communication with Friends and Family: Not on file    Frequency of Social Gatherings with Friends and Family: More than three times a week    Attends Religious Services: Not on file    Active Member of Clubs or Organizations: Not on file    Attends Banker Meetings: Not on file    Marital Status: Not on file  Intimate Partner Violence: Not At Risk (09/14/2017)   Humiliation, Afraid, Rape, and Kick questionnaire    Fear of Current or Ex-Partner: No    Emotionally Abused: No    Physically Abused: No    Sexually Abused: No    Past Surgical History:  Procedure  Laterality Date   BACK SURGERY     BIOPSY PROSTATE  08/2018   CARDIAC SURGERY     STOMACH SURGERY      Family History  Problem Relation Age of Onset   Heart disease Mother    Diabetes Father    Prostate cancer Neg Hx    Kidney cancer Neg Hx    Bladder Cancer Neg Hx     Allergies  Allergen Reactions   Heparin     REACTION: Unspecified   Repaglinide Other (See Comments)    Increased appetite Increased appetite Increased appetite        Latest Ref Rng & Units 01/23/2023   10:21 AM 10/24/2022   10:27 AM 10/03/2022   10:51 AM  CBC  WBC 4.0 - 10.5 K/uL 3.5  4.4    Hemoglobin 13.0 - 17.0 g/dL 8.5  9.9  9.7   Hematocrit 39.0 - 52.0 % 26.3  32.1  31.9   Platelets 150 - 400 K/uL 128  151        CMP     Component Value Date/Time   NA 141 09/06/2017 0645   K 3.5 09/06/2017 0645   CL 104 09/06/2017 0645   CO2 28 09/06/2017 0645   GLUCOSE 137 (H) 09/06/2017 0645   BUN 23 (H) 09/06/2017 0645   CREATININE 1.76 (H) 09/06/2017 0645   CALCIUM 8.6 (L) 09/06/2017 0645   PROT 5.7 (L) 11/04/2007 0400   ALBUMIN 3.0 (L) 08/18/2017 0500   AST 30 11/04/2007 0400   ALT 23 11/04/2007 0400   ALKPHOS 44 11/04/2007 0400   BILITOT 0.3 11/04/2007 0400   GFRNONAA 35 (L) 09/06/2017 0645     No results found.     Assessment & Plan:   1. Venous ulcer (HCC) The patient has a new ulcer on his calf.  No evidence of infection.  He will continue utilizing Unna boots.  He will be placed in Unna boots to be changed on a weekly basis.  Will reevaluate his progress in 4 weeks.  2. Diabetes mellitus with stage 3 chronic kidney disease (HCC) Continue hypoglycemic medications as already ordered, these medications have been reviewed and there are no changes at this time.  Hgb A1C to be monitored as already arranged by primary service  3. Lymphedema Besides the new ulceration that has presented he has been doing fairly well with the swelling.  He has been having Lovenox for several weeks.  Based  on this I think it would be a good idea for him to take an [place] boot holiday once his wound is healed.  We discussed that he will need to be diligent with use of compression and elevation.  He is agreeable to this plan.   Current Outpatient Medications on File Prior to Visit  Medication Sig Dispense Refill   aspirin 325 MG tablet Take 325 mg by mouth daily.     atorvastatin (LIPITOR) 20 MG tablet Take 20 mg by mouth daily.     calcitRIOL (ROCALTROL) 0.25 MCG capsule Take by mouth.     carvedilol (COREG) 12.5 MG tablet Take 6.25 mg by mouth 2 (two) times daily with a meal.     Cholecalciferol (VITAMIN D3) 10 MCG (400 UNIT) tablet Take 400 Units by mouth daily.     CINNAMON PO Take 1,000 tablets by mouth daily at 6 (six) AM.     Ferrous Sulfate (IRON) 325 (65 Fe) MG TABS Take 1 tablet by mouth daily.     Garlic 1000 MG CAPS Take 1,000 mg by mouth daily at 6 (six) AM.     Ginger, Zingiber officinalis, (GINGER ROOT) 550 MG CAPS Take 1 capsule by mouth daily at 6 (six) AM.     lisinopril (PRINIVIL,ZESTRIL) 20 MG tablet Take 10 mg by mouth daily.     magnesium oxide (MAG-OX) 400 MG tablet Take 500 mg by mouth daily.     Misc Natural Products (APPLE CIDER VINEGAR DIET PO) Take 450 mg by mouth daily.     Multiple Vitamin (MULTIVITAMIN) capsule Take 1 capsule by mouth daily.     omeprazole (PRILOSEC) 20 MG capsule Take 20 mg by mouth daily.     Potassium 99 MG TABS Take 99 mg by mouth daily as needed.     tamsulosin (FLOMAX) 0.4 MG CAPS capsule Take 1 capsule (0.4 mg total) by mouth 2 (two) times daily. 180 capsule 3   torsemide (DEMADEX) 20 MG tablet Take 20 mg by mouth daily.     Turmeric 500 MG CAPS Take 500 mg by mouth daily.     vitamin E 400 UNIT capsule Take 400 Units by mouth daily.     No current facility-administered medications on file prior to visit.    There are no Patient Instructions on file for this visit. No follow-ups on file.   Georgiana Spinner, NP

## 2023-02-09 ENCOUNTER — Encounter (INDEPENDENT_AMBULATORY_CARE_PROVIDER_SITE_OTHER): Payer: Self-pay | Admitting: Nurse Practitioner

## 2023-02-13 ENCOUNTER — Encounter (INDEPENDENT_AMBULATORY_CARE_PROVIDER_SITE_OTHER): Payer: Self-pay | Admitting: Nurse Practitioner

## 2023-02-13 ENCOUNTER — Ambulatory Visit (INDEPENDENT_AMBULATORY_CARE_PROVIDER_SITE_OTHER): Payer: Medicare Other | Admitting: Nurse Practitioner

## 2023-02-13 VITALS — BP 116/68 | HR 67 | Resp 17 | Ht 66.0 in | Wt 182.6 lb

## 2023-02-13 DIAGNOSIS — L97909 Non-pressure chronic ulcer of unspecified part of unspecified lower leg with unspecified severity: Secondary | ICD-10-CM

## 2023-02-13 DIAGNOSIS — I83009 Varicose veins of unspecified lower extremity with ulcer of unspecified site: Secondary | ICD-10-CM

## 2023-02-13 NOTE — Progress Notes (Signed)
History of Present Illness  There is no documented history at this time  Assessments & Plan   There are no diagnoses linked to this encounter.    Additional instructions  Subjective:  Patient presents with venous ulcer of the Bilateral lower extremity.    Procedure:  3 layer unna wrap was placed Bilateral lower extremity.   Plan:   Zeroform was placed on the back of pt's R leg where a blister has popped. Follow up in one week.

## 2023-02-20 ENCOUNTER — Ambulatory Visit (INDEPENDENT_AMBULATORY_CARE_PROVIDER_SITE_OTHER): Payer: Medicare Other | Admitting: Nurse Practitioner

## 2023-02-20 ENCOUNTER — Encounter (INDEPENDENT_AMBULATORY_CARE_PROVIDER_SITE_OTHER): Payer: Self-pay | Admitting: Nurse Practitioner

## 2023-02-20 VITALS — BP 121/77 | HR 72 | Resp 17 | Ht 67.0 in | Wt 180.0 lb

## 2023-02-20 DIAGNOSIS — I83009 Varicose veins of unspecified lower extremity with ulcer of unspecified site: Secondary | ICD-10-CM | POA: Diagnosis not present

## 2023-02-20 DIAGNOSIS — L97909 Non-pressure chronic ulcer of unspecified part of unspecified lower leg with unspecified severity: Secondary | ICD-10-CM | POA: Diagnosis not present

## 2023-02-20 NOTE — Progress Notes (Signed)
History of Present Illness  There is no documented history at this time  Assessments & Plan   There are no diagnoses linked to this encounter.    Additional instructions  Subjective:  Patient presents with venous ulcer of the Bilateral lower extremity.    Procedure:  3 layer unna wrap was placed Bilateral lower extremity.   Plan:   Follow up in one week.  

## 2023-02-22 ENCOUNTER — Inpatient Hospital Stay: Payer: Medicare Other | Attending: Internal Medicine

## 2023-02-22 ENCOUNTER — Other Ambulatory Visit: Payer: Self-pay | Admitting: Internal Medicine

## 2023-02-22 ENCOUNTER — Inpatient Hospital Stay: Payer: Medicare Other

## 2023-02-22 VITALS — BP 116/62

## 2023-02-22 DIAGNOSIS — N184 Chronic kidney disease, stage 4 (severe): Secondary | ICD-10-CM | POA: Diagnosis not present

## 2023-02-22 DIAGNOSIS — D631 Anemia in chronic kidney disease: Secondary | ICD-10-CM | POA: Insufficient documentation

## 2023-02-22 DIAGNOSIS — I130 Hypertensive heart and chronic kidney disease with heart failure and stage 1 through stage 4 chronic kidney disease, or unspecified chronic kidney disease: Secondary | ICD-10-CM | POA: Diagnosis not present

## 2023-02-22 DIAGNOSIS — N1832 Chronic kidney disease, stage 3b: Secondary | ICD-10-CM

## 2023-02-22 DIAGNOSIS — Z79899 Other long term (current) drug therapy: Secondary | ICD-10-CM | POA: Insufficient documentation

## 2023-02-22 LAB — HEMOGLOBIN AND HEMATOCRIT (CANCER CENTER ONLY)
HCT: 27.8 % — ABNORMAL LOW (ref 39.0–52.0)
Hemoglobin: 8.8 g/dL — ABNORMAL LOW (ref 13.0–17.0)

## 2023-02-22 MED ORDER — DARBEPOETIN ALFA 150 MCG/0.3ML IJ SOSY
150.0000 ug | PREFILLED_SYRINGE | Freq: Once | INTRAMUSCULAR | Status: AC
  Administered 2023-02-22: 150 ug via SUBCUTANEOUS
  Filled 2023-02-22: qty 0.3

## 2023-02-27 ENCOUNTER — Ambulatory Visit (INDEPENDENT_AMBULATORY_CARE_PROVIDER_SITE_OTHER): Payer: Medicare Other | Admitting: Nurse Practitioner

## 2023-02-27 ENCOUNTER — Encounter (INDEPENDENT_AMBULATORY_CARE_PROVIDER_SITE_OTHER): Payer: Self-pay

## 2023-02-27 VITALS — BP 125/65 | HR 73 | Resp 18 | Ht 67.0 in | Wt 180.0 lb

## 2023-02-27 DIAGNOSIS — L97909 Non-pressure chronic ulcer of unspecified part of unspecified lower leg with unspecified severity: Secondary | ICD-10-CM | POA: Diagnosis not present

## 2023-02-27 DIAGNOSIS — I83009 Varicose veins of unspecified lower extremity with ulcer of unspecified site: Secondary | ICD-10-CM

## 2023-02-27 NOTE — Progress Notes (Unsigned)
History of Present Illness  There is no documented history at this time  Assessments & Plan   There are no diagnoses linked to this encounter.    Additional instructions  Subjective:  Patient presents with venous ulcer of the Bilateral lower extremity.    Procedure:  3 layer unna wrap was placed Bilateral lower extremity.   Plan:   Follow up in one week.  

## 2023-03-06 ENCOUNTER — Encounter (INDEPENDENT_AMBULATORY_CARE_PROVIDER_SITE_OTHER): Payer: Self-pay | Admitting: Nurse Practitioner

## 2023-03-06 ENCOUNTER — Ambulatory Visit (INDEPENDENT_AMBULATORY_CARE_PROVIDER_SITE_OTHER): Payer: Medicare Other | Admitting: Nurse Practitioner

## 2023-03-06 VITALS — BP 125/71 | HR 76 | Resp 18 | Ht 66.0 in | Wt 180.8 lb

## 2023-03-06 DIAGNOSIS — L97909 Non-pressure chronic ulcer of unspecified part of unspecified lower leg with unspecified severity: Secondary | ICD-10-CM

## 2023-03-06 DIAGNOSIS — I890 Lymphedema, not elsewhere classified: Secondary | ICD-10-CM

## 2023-03-06 DIAGNOSIS — N1832 Chronic kidney disease, stage 3b: Secondary | ICD-10-CM | POA: Diagnosis not present

## 2023-03-06 DIAGNOSIS — E1122 Type 2 diabetes mellitus with diabetic chronic kidney disease: Secondary | ICD-10-CM | POA: Diagnosis not present

## 2023-03-06 DIAGNOSIS — I83009 Varicose veins of unspecified lower extremity with ulcer of unspecified site: Secondary | ICD-10-CM

## 2023-03-06 DIAGNOSIS — N183 Chronic kidney disease, stage 3 unspecified: Secondary | ICD-10-CM

## 2023-03-06 NOTE — Progress Notes (Signed)
Subjective:    Patient ID: Bill Peterson, male    DOB: Oct 13, 1934, 88 y.o.   MRN: [id number] Chief Complaint  Patient presents with   Follow-up    Follow up 4 week unna boot     The patient returns today for evaluation of his bilateral lower extremity edema.  He has been in [employer] for several months due to weeping and wounds.  Today all of his wounds are healed.  He denies any extensive weeping.  He does have some difficulties with fluid balance due to his kidney disease as well as valvular issues.  Overall his progress is doing fairly well.    Review of Systems  Cardiovascular:  Positive for leg swelling.  Musculoskeletal:  Positive for arthralgias and joint swelling.  All other systems reviewed and are negative.      Objective:   Physical Exam Vitals reviewed.  HENT:     Head: Normocephalic.  Cardiovascular:     Rate and Rhythm: Normal rate.  Pulmonary:     Effort: Pulmonary effort is normal.  Musculoskeletal:     Right lower leg: Edema present.     Left lower leg: Edema present.  Skin:    General: Skin is warm and dry.  Neurological:     Mental Status: He is alert and oriented to person, place, and time.  Psychiatric:        Mood and Affect: Mood normal.        Behavior: Behavior normal.        Thought Content: Thought content normal.        Judgment: Judgment normal.     BP 125/71 (BP Location: Left Arm)   Pulse 76   Resp 18   Ht 5\' 6"  (1.676 m)   Wt 180 lb 12.8 oz (82 kg)   BMI 29.18 kg/m   Past Medical History:  Diagnosis Date   BPH (benign prostatic hyperplasia)    CHF (congestive heart failure) (HCC)    Diabetes mellitus without complication (HCC)    Hyperlipidemia    Hypertension     Social History   Socioeconomic History   Marital status: Married    Spouse name: Not on file   Number of children: Not on file   Years of education: Not on file   Highest education level: Not on file  Occupational History   Occupation: retired   Tobacco Use   Smoking status: Never    Passive exposure: Never   Smokeless tobacco: Never  Vaping Use   Vaping Use: Never used  Substance and Sexual Activity   Alcohol use: No   Drug use: No   Sexual activity: Yes  Other Topics Concern   Not on file  Social History Narrative   Lives at home with his wife. Active at baseline   Social Determinants of Health   Financial Resource Strain: Low Risk  (10/15/2018)   Overall Financial Resource Strain (CARDIA)    Difficulty of Paying Living Expenses: Not hard at all  Food Insecurity: No Food Insecurity (09/14/2017)   Hunger Vital Sign    Worried About Running Out of Food in the Last Year: Never true    Ran Out of Food in the Last Year: Never true  Transportation Needs: No Transportation Needs (09/14/2017)   PRAPARE - Administrator, Civil Service (Medical): No    Lack of Transportation (Non-Medical): No  Physical Activity: Unknown (10/15/2018)   Exercise Vital Sign    Days of  Exercise per Week: 3 days    Minutes of Exercise per Session: Not on file  Stress: No Stress Concern Present (10/15/2018)   [employer] of Occupational Health - Occupational Stress Questionnaire    Feeling of Stress : Only a little  Social Connections: Unknown (10/15/2018)   Social Connection and Isolation Panel [NHANES]    Frequency of Communication with Friends and Family: Not on file    Frequency of Social Gatherings with Friends and Family: More than three times a week    Attends Religious Services: Not on file    Active Member of Clubs or Organizations: Not on file    Attends Banker Meetings: Not on file    Marital Status: Not on file  Intimate Partner Violence: Not At Risk (09/14/2017)   Humiliation, Afraid, Rape, and Kick questionnaire    Fear of Current or Ex-Partner: No    Emotionally Abused: No    Physically Abused: No    Sexually Abused: No    Past Surgical History:  Procedure Laterality Date   BACK SURGERY      BIOPSY PROSTATE  08/2018   CARDIAC SURGERY     STOMACH SURGERY      Family History  Problem Relation Age of Onset   Heart disease Mother    Diabetes Father    Prostate cancer Neg Hx    Kidney cancer Neg Hx    Bladder Cancer Neg Hx     Allergies  Allergen Reactions   Heparin     REACTION: Unspecified   Repaglinide Other (See Comments)    Increased appetite Increased appetite Increased appetite        Latest Ref Rng & Units 02/22/2023    9:50 AM 01/23/2023   10:21 AM 10/24/2022   10:27 AM  CBC  WBC 4.0 - 10.5 K/uL  3.5  4.4   Hemoglobin 13.0 - 17.0 g/dL 8.8  8.5  9.9   Hematocrit 39.0 - 52.0 % 27.8  26.3  32.1   Platelets 150 - 400 K/uL  128  151       CMP     Component Value Date/Time   NA 141 09/06/2017 0645   K 3.5 09/06/2017 0645   CL 104 09/06/2017 0645   CO2 28 09/06/2017 0645   GLUCOSE 137 (H) 09/06/2017 0645   BUN 23 (H) 09/06/2017 0645   CREATININE 1.76 (H) 09/06/2017 0645   CALCIUM 8.6 (L) 09/06/2017 0645   PROT 5.7 (L) 11/04/2007 0400   ALBUMIN 3.0 (L) 08/18/2017 0500   AST 30 11/04/2007 0400   ALT 23 11/04/2007 0400   ALKPHOS 44 11/04/2007 0400   BILITOT 0.3 11/04/2007 0400   GFRNONAA 35 (L) 09/06/2017 0645     No results found.     Assessment & Plan:   1. Lymphedema The patient has been in wraps for several months now and all of his wounds are finally healed.  He still has some swelling but at this time we will plan on giving him a vacation from the [employer].  Will plan on having him return in 5 to 6 weeks however if he has issues sooner he is advised to follow-up with [place] before that timeframe.  He is also advised to continue with conservative therapy including use of medical grade compression, elevation and activity as tolerable.  2. Venous ulcer (HCC) All wounds are healed at this time.  3. Diabetes mellitus with stage 3 chronic kidney disease (HCC) Continue hypoglycemic medications as already  ordered, these medications have been  reviewed and there are no changes at this time.  Hgb A1C to be monitored as already arranged by primary service  4. Stage 3b chronic kidney disease (HCC) This also contributes to his lower extremity edema   Current Outpatient Medications on File Prior to Visit  Medication Sig Dispense Refill   aspirin 325 MG tablet Take 325 mg by mouth daily.     atorvastatin (LIPITOR) 20 MG tablet Take 20 mg by mouth daily.     calcitRIOL (ROCALTROL) 0.25 MCG capsule Take by mouth.     carvedilol (COREG) 6.25 MG tablet Take by mouth.     Cholecalciferol (VITAMIN D3) 10 MCG (400 UNIT) tablet Take 400 Units by mouth daily.     CINNAMON PO Take 1,000 tablets by mouth daily at 6 (six) AM.     Ferrous Sulfate (IRON) 325 (65 Fe) MG TABS Take 1 tablet by mouth daily.     Garlic 1000 MG CAPS Take 1,000 mg by mouth daily at 6 (six) AM.     Ginger, Zingiber officinalis, (GINGER ROOT) 550 MG CAPS Take 1 capsule by mouth daily at 6 (six) AM.     lisinopril (PRINIVIL,ZESTRIL) 20 MG tablet Take 10 mg by mouth daily.     magnesium oxide (MAG-OX) 400 MG tablet Take 500 mg by mouth daily.     Misc Natural Products (APPLE CIDER VINEGAR DIET PO) Take 450 mg by mouth daily.     Multiple Vitamin (MULTIVITAMIN) capsule Take 1 capsule by mouth daily.     omeprazole (PRILOSEC) 20 MG capsule Take 20 mg by mouth daily.     Potassium 99 MG TABS Take 99 mg by mouth daily as needed.     tamsulosin (FLOMAX) 0.4 MG CAPS capsule Take 1 capsule (0.4 mg total) by mouth 2 (two) times daily. 180 capsule 3   torsemide (DEMADEX) 20 MG tablet Take 20 mg by mouth daily.     Turmeric 500 MG CAPS Take 500 mg by mouth daily.     vitamin E 400 UNIT capsule Take 400 Units by mouth daily.     carvedilol (COREG) 12.5 MG tablet Take 6.25 mg by mouth 2 (two) times daily with a meal. (Patient not taking: Reported on 02/20/2023)     No current facility-administered medications on file prior to visit.    There are no Patient Instructions on file for  this visit. No follow-ups on file.   Georgiana Spinner, NP

## 2023-03-22 ENCOUNTER — Inpatient Hospital Stay: Payer: Medicare Other

## 2023-03-22 ENCOUNTER — Inpatient Hospital Stay: Payer: Medicare Other | Attending: Internal Medicine

## 2023-03-22 DIAGNOSIS — I130 Hypertensive heart and chronic kidney disease with heart failure and stage 1 through stage 4 chronic kidney disease, or unspecified chronic kidney disease: Secondary | ICD-10-CM | POA: Diagnosis not present

## 2023-03-22 DIAGNOSIS — D631 Anemia in chronic kidney disease: Secondary | ICD-10-CM | POA: Insufficient documentation

## 2023-03-22 DIAGNOSIS — N189 Chronic kidney disease, unspecified: Secondary | ICD-10-CM | POA: Insufficient documentation

## 2023-03-22 DIAGNOSIS — N1832 Chronic kidney disease, stage 3b: Secondary | ICD-10-CM

## 2023-03-22 LAB — HEMOGLOBIN AND HEMATOCRIT (CANCER CENTER ONLY)
HCT: 32.5 % — ABNORMAL LOW (ref 39.0–52.0)
Hemoglobin: 10.5 g/dL — ABNORMAL LOW (ref 13.0–17.0)

## 2023-03-22 NOTE — Progress Notes (Signed)
Hgb is 10.5; hold aranesp

## 2023-03-29 ENCOUNTER — Emergency Department: Payer: Medicare Other

## 2023-03-29 ENCOUNTER — Inpatient Hospital Stay
Admission: EM | Admit: 2023-03-29 | Discharge: 2023-04-29 | DRG: 871 | Disposition: E | Payer: Medicare Other | Attending: Student in an Organized Health Care Education/Training Program | Admitting: Student in an Organized Health Care Education/Training Program

## 2023-03-29 ENCOUNTER — Other Ambulatory Visit: Payer: Self-pay

## 2023-03-29 DIAGNOSIS — E785 Hyperlipidemia, unspecified: Secondary | ICD-10-CM | POA: Diagnosis present

## 2023-03-29 DIAGNOSIS — G928 Other toxic encephalopathy: Secondary | ICD-10-CM | POA: Diagnosis present

## 2023-03-29 DIAGNOSIS — D638 Anemia in other chronic diseases classified elsewhere: Secondary | ICD-10-CM | POA: Diagnosis present

## 2023-03-29 DIAGNOSIS — L97529 Non-pressure chronic ulcer of other part of left foot with unspecified severity: Secondary | ICD-10-CM | POA: Diagnosis present

## 2023-03-29 DIAGNOSIS — R319 Hematuria, unspecified: Secondary | ICD-10-CM | POA: Diagnosis present

## 2023-03-29 DIAGNOSIS — E8809 Other disorders of plasma-protein metabolism, not elsewhere classified: Secondary | ICD-10-CM | POA: Diagnosis present

## 2023-03-29 DIAGNOSIS — I252 Old myocardial infarction: Secondary | ICD-10-CM

## 2023-03-29 DIAGNOSIS — Z7982 Long term (current) use of aspirin: Secondary | ICD-10-CM

## 2023-03-29 DIAGNOSIS — I350 Nonrheumatic aortic (valve) stenosis: Secondary | ICD-10-CM | POA: Diagnosis present

## 2023-03-29 DIAGNOSIS — W19XXXA Unspecified fall, initial encounter: Secondary | ICD-10-CM | POA: Diagnosis present

## 2023-03-29 DIAGNOSIS — N400 Enlarged prostate without lower urinary tract symptoms: Secondary | ICD-10-CM | POA: Diagnosis present

## 2023-03-29 DIAGNOSIS — I130 Hypertensive heart and chronic kidney disease with heart failure and stage 1 through stage 4 chronic kidney disease, or unspecified chronic kidney disease: Secondary | ICD-10-CM | POA: Diagnosis present

## 2023-03-29 DIAGNOSIS — E1122 Type 2 diabetes mellitus with diabetic chronic kidney disease: Secondary | ICD-10-CM | POA: Diagnosis present

## 2023-03-29 DIAGNOSIS — R6521 Severe sepsis with septic shock: Secondary | ICD-10-CM | POA: Diagnosis present

## 2023-03-29 DIAGNOSIS — M79605 Pain in left leg: Secondary | ICD-10-CM | POA: Diagnosis present

## 2023-03-29 DIAGNOSIS — N179 Acute kidney failure, unspecified: Secondary | ICD-10-CM | POA: Diagnosis present

## 2023-03-29 DIAGNOSIS — L03115 Cellulitis of right lower limb: Secondary | ICD-10-CM | POA: Diagnosis present

## 2023-03-29 DIAGNOSIS — M6282 Rhabdomyolysis: Secondary | ICD-10-CM | POA: Diagnosis present

## 2023-03-29 DIAGNOSIS — J9601 Acute respiratory failure with hypoxia: Secondary | ICD-10-CM | POA: Diagnosis present

## 2023-03-29 DIAGNOSIS — R7401 Elevation of levels of liver transaminase levels: Secondary | ICD-10-CM | POA: Diagnosis present

## 2023-03-29 DIAGNOSIS — R634 Abnormal weight loss: Secondary | ICD-10-CM | POA: Diagnosis present

## 2023-03-29 DIAGNOSIS — Z515 Encounter for palliative care: Secondary | ICD-10-CM

## 2023-03-29 DIAGNOSIS — Z6828 Body mass index (BMI) 28.0-28.9, adult: Secondary | ICD-10-CM

## 2023-03-29 DIAGNOSIS — E872 Acidosis, unspecified: Secondary | ICD-10-CM | POA: Diagnosis present

## 2023-03-29 DIAGNOSIS — Z79899 Other long term (current) drug therapy: Secondary | ICD-10-CM

## 2023-03-29 DIAGNOSIS — Z833 Family history of diabetes mellitus: Secondary | ICD-10-CM

## 2023-03-29 DIAGNOSIS — Z66 Do not resuscitate: Secondary | ICD-10-CM | POA: Diagnosis not present

## 2023-03-29 DIAGNOSIS — L97519 Non-pressure chronic ulcer of other part of right foot with unspecified severity: Secondary | ICD-10-CM | POA: Diagnosis present

## 2023-03-29 DIAGNOSIS — I5043 Acute on chronic combined systolic (congestive) and diastolic (congestive) heart failure: Secondary | ICD-10-CM | POA: Diagnosis present

## 2023-03-29 DIAGNOSIS — A419 Sepsis, unspecified organism: Principal | ICD-10-CM | POA: Diagnosis present

## 2023-03-29 DIAGNOSIS — E875 Hyperkalemia: Secondary | ICD-10-CM | POA: Diagnosis present

## 2023-03-29 DIAGNOSIS — S81809A Unspecified open wound, unspecified lower leg, initial encounter: Secondary | ICD-10-CM

## 2023-03-29 DIAGNOSIS — Z8249 Family history of ischemic heart disease and other diseases of the circulatory system: Secondary | ICD-10-CM

## 2023-03-29 DIAGNOSIS — Z1152 Encounter for screening for COVID-19: Secondary | ICD-10-CM

## 2023-03-29 DIAGNOSIS — I2510 Atherosclerotic heart disease of native coronary artery without angina pectoris: Secondary | ICD-10-CM | POA: Diagnosis present

## 2023-03-29 DIAGNOSIS — N184 Chronic kidney disease, stage 4 (severe): Secondary | ICD-10-CM | POA: Diagnosis present

## 2023-03-29 MED ORDER — LACTATED RINGERS IV BOLUS (SEPSIS)
1000.0000 mL | Freq: Once | INTRAVENOUS | Status: AC
  Administered 2023-03-30: 1000 mL via INTRAVENOUS

## 2023-03-29 MED ORDER — SODIUM CHLORIDE 0.9 % IV SOLN
2.0000 g | Freq: Once | INTRAVENOUS | Status: AC
  Administered 2023-03-30: 2 g via INTRAVENOUS
  Filled 2023-03-29: qty 12.5

## 2023-03-29 MED ORDER — VANCOMYCIN HCL IN DEXTROSE 1-5 GM/200ML-% IV SOLN
1000.0000 mg | Freq: Once | INTRAVENOUS | Status: AC
  Administered 2023-03-30: 1000 mg via INTRAVENOUS
  Filled 2023-03-29: qty 200

## 2023-03-29 MED ORDER — METRONIDAZOLE 500 MG/100ML IV SOLN
500.0000 mg | Freq: Once | INTRAVENOUS | Status: AC
  Administered 2023-03-30: 500 mg via INTRAVENOUS
  Filled 2023-03-29: qty 100

## 2023-03-29 MED ORDER — LACTATED RINGERS IV BOLUS (SEPSIS)
500.0000 mL | Freq: Once | INTRAVENOUS | Status: AC
  Administered 2023-03-30: 500 mL via INTRAVENOUS

## 2023-03-29 NOTE — ED Notes (Signed)
Patient transported to CT 

## 2023-03-29 NOTE — ED Provider Notes (Signed)
[facility] Provider Note    Event Date/Time   First MD Initiated Contact with Patient 04/19/2023 2329     (approximate)   History   Code Sepsis   HPI  Bill Peterson is a 88 y.o. male  here with AMS.  History provided primarily by EMS.  Per report, the patient was found down on the ground in his house.  He is unsure how long he was down.  He initially told me that he had just fallen today but does not know what day it is or recall much about what caused him to fall.  He was found with debris from his home stuck to his legs and significant weeping from the legs.  Patient states he feels tired but denies any specific complaints.  He does not recall hitting his head.  He states he has some mild bilateral leg pain but this is somewhat chronic and he has been going to the wound care center for wounds in his legs due to edema, that reportedly has not been seen for several weeks according to EMS report.      Physical Exam   Triage Vital Signs: ED Triage Vitals [04/12/2023 2327]  Encounter Vitals Group     BP (!) 92/31     Systolic BP Percentile      Diastolic BP Percentile      Pulse Rate 77     Resp (!) 26     Temp (!) 92 F (33.3 C)     Temp Source Rectal     SpO2 94 %     Weight      Height      Head Circumference      Peak Flow      Pain Score      Pain Loc      Pain Education      Exclude from Growth Chart     Most recent vital signs: Vitals:   04/07/2023 0335 04/12/2023 0336  BP: 105/63   Pulse: (!) 114   Resp: (!) 29   Temp:  (!) 95.2 F (35.1 C)  SpO2: 97%      General: Drowsy but arouses and answers questions appropriately, although confused as to the date. CV:  Good peripheral perfusion.  Regular rate and rhythm. Resp:  Normal work of breathing.  Lungs clear. Abd:  No distention.  No tenderness.  No rebound or guarding. Other:  Significantly disheveled.  Dry mucous membranes and poor skin turgor.  There are multiple open, draining  wounds to the bilateral legs, some with newspaper and him debris stuck to them.  Small amount of bleeding and oozing.  Mild malodor.   ED Results / Procedures / Treatments   Labs (all labs ordered are listed, but only abnormal results are displayed) Labs Reviewed  LACTIC ACID, PLASMA - Abnormal; Notable for the following components:      Result Value   Lactic Acid, Venous 2.3 (*)    All other components within normal limits  COMPREHENSIVE METABOLIC PANEL - Abnormal; Notable for the following components:   Potassium 7.3 (*)    CO2 <7 (*)    BUN 209 (*)    Creatinine, Ser 14.20 (*)    Calcium 7.9 (*)    Total Protein 6.4 (*)    Albumin 2.6 (*)    AST 47 (*)    GFR, Estimated 3 (*)    All other components within normal limits  CBC WITH DIFFERENTIAL/PLATELET -  Abnormal; Notable for the following components:   WBC 10.7 (*)    RBC 3.03 (*)    Hemoglobin 9.4 (*)    HCT 26.8 (*)    Neutro Abs 9.9 (*)    Lymphs Abs 0.3 (*)    All other components within normal limits  PROTIME-INR - Abnormal; Notable for the following components:   Prothrombin Time 19.0 (*)    INR 1.6 (*)    All other components within normal limits  APTT - Abnormal; Notable for the following components:   aPTT 39 (*)    All other components within normal limits  URINALYSIS, W/ REFLEX TO CULTURE (INFECTION SUSPECTED) - Abnormal; Notable for the following components:   Color, Urine YELLOW (*)    APPearance HAZY (*)    Hgb urine dipstick MODERATE (*)    Protein, ur 100 (*)    Leukocytes,Ua SMALL (*)    All other components within normal limits  CK - Abnormal; Notable for the following components:   Total CK 1,073 (*)    All other components within normal limits  BRAIN NATRIURETIC PEPTIDE - Abnormal; Notable for the following components:   B Natriuretic Peptide 3,125.5 (*)    All other components within normal limits  BLOOD GAS, ARTERIAL - Abnormal; Notable for the following components:   pH, Arterial 7.17 (*)     pCO2 arterial <18 (*)    pO2, Arterial 55 (*)    Bicarbonate 5.8 (*)    Acid-base deficit 20.4 (*)    All other components within normal limits  RESP PANEL BY RT-PCR (RSV, FLU A&B, COVID)  RVPGX2  CULTURE, BLOOD (ROUTINE X 2)  CULTURE, BLOOD (ROUTINE X 2)  LACTIC ACID, PLASMA  TSH  CBC  MAGNESIUM  PHOSPHORUS  COMPREHENSIVE METABOLIC PANEL  MAGNESIUM  PHOSPHORUS  RENAL FUNCTION PANEL  RENAL FUNCTION PANEL  CBG MONITORING, ED     EKG Atrial fibrillation vs junctional rhythm, VR 77. QRS 138, QTc 525. Non specific IVCD. ST depressiosn in lateral precordial leads.    RADIOLOGY CT Head: ? Subacute vs chronic subdural CXR: cardiomegaly   I also independently reviewed and agree with radiologist interpretations.   PROCEDURES:  Critical Care performed: Yes, see critical care procedure note(s)  .Critical Care  Performed by: Shaune Pollack, MD Authorized by: Shaune Pollack, MD   Critical care provider statement:    Critical care time (minutes):  30   Critical care time was exclusive of:  Separately billable procedures and treating other patients   Critical care was necessary to treat or prevent imminent or life-threatening deterioration of the following conditions:  Cardiac failure, circulatory failure, respiratory failure and sepsis   Critical care was time spent personally by me on the following activities:  Development of treatment plan with patient or surrogate, discussions with consultants, evaluation of patient's response to treatment, examination of patient, ordering and review of laboratory studies, ordering and review of radiographic studies, ordering and performing treatments and interventions, pulse oximetry, re-evaluation of patient's condition and review of old charts     MEDICATIONS ORDERED IN ED: Medications  sodium bicarbonate 150 mEq in dextrose 5 % 1,150 mL infusion ( Intravenous New Bag/Given 04/04/2023 0149)  sodium zirconium cyclosilicate (LOKELMA)  packet 10 g (0 g Oral Hold 04/18/2023 0135)  norepinephrine (LEVOPHED) 4mg  in (0.016 mg/mL) premix infusion (20 mcg/min Intravenous Rate/Dose Change 04/10/2023 0326)  docusate sodium (COLACE) capsule 100 mg (has no administration in time range)  polyethylene glycol (MIRALAX / GLYCOLAX) packet 17  g (has no administration in time range)  pantoprazole (PROTONIX) injection 40 mg (40 mg Intravenous Given 04/13/2023 0226)  midazolam (VERSED) injection 4 mg (4 mg Intravenous See Procedure Record 03/29/2023 0316)  etomidate (AMIDATE) injection 20 mg (20 mg Intravenous See Procedure Record 04/19/2023 0315)  rocuronium bromide 10 mg/mL (PF) syringe (80 mg Intravenous See Procedure Record 04/09/2023 0316)  fentaNYL in NS (9mcg/ml) infusion-PREMIX (25 mcg/hr Intravenous New Bag/Given 04/28/2023 0322)  fentaNYL (SUBLIMAZE) bolus via infusion 25-100 mcg (has no administration in time range)  midazolam (VERSED) injection 1-2 mg (has no administration in time range)  sodium bicarbonate injection (100 mEq Intravenous Given 04/16/2023 0301)  PrismaSol BGK 2/3.5 infusion (has no administration in time range)  PrismaSol BGK 2/3.5 infusion (has no administration in time range)  PrismaSol BGK 2/3.5 infusion (has no administration in time range)  midazolam (VERSED) 5 MG/5ML injection (4 mg Intravenous Given 04/13/2023 0304)  anticoagulant sodium citrate solution 5 mL (has no administration in time range)  etomidate (AMIDATE) injection (20 mg Intravenous Given 04/03/2023 0307)  rocuronium (ZEMURON) injection (80 mg Intravenous Given 04/19/2023 0307)  lactated ringers bolus 1,000 mL (0 mLs Intravenous Stopped 04/21/2023 0122)    And  lactated ringers bolus 1,000 mL (0 mLs Intravenous Stopped 04/07/2023 0121)    And  lactated ringers bolus 500 mL (0 mLs Intravenous Stopped 04/01/2023 0122)  ceFEPIme (MAXIPIME) 2 g in sodium chloride 0.9 % 100 mL IVPB (0 g Intravenous Stopped 04/14/2023 0218)  metroNIDAZOLE (FLAGYL) IVPB 500 mg (0 mg Intravenous Stopped  04/22/2023 0121)  vancomycin (VANCOCIN) IVPB 1000 mg/200 mL premix (0 mg Intravenous Stopped 04/14/2023 0122)  vancomycin (VANCOCIN) IVPB 1000 mg/200 mL premix (0 mg Intravenous Stopped 04/15/2023 0317)  calcium gluconate 1 g/ 50 mL sodium chloride IVPB (0 mg Intravenous Stopped 04/23/2023 0312)  dextrose 50 % solution 50 mL (50 mLs Intravenous Given 03/29/2023 0149)  sodium bicarbonate injection 50 mEq (50 mEq Intravenous Given 04/23/2023 0157)  albuterol (PROVENTIL) (2.5 MG/3ML) 0.083% nebulizer solution 2.5 mg (2.5 mg Nebulization Given 04/21/2023 0206)  insulin aspart (novoLOG) injection 10 Units (10 Units Intravenous Given 04/14/2023 0154)  fentaNYL (SUBLIMAZE) injection 100 mcg (100 mcg Intravenous Given 04/20/2023 0259)  fentaNYL (SUBLIMAZE) injection 25 mcg (25 mcg Intravenous Given 04/10/2023 0315)  sodium chloride 0.9 % bolus 250 mL (250 mLs Intravenous New Bag/Given 04/28/2023 0319)     IMPRESSION / MDM / ASSESSMENT AND PLAN / ED COURSE  I reviewed the triage vital signs and the nursing notes.                              Differential diagnosis includes, but is not limited to, severe sepsis, acute renal failure, rhabdomyolysis, heart failure, anemia, polypharmacy  Patient's presentation is most consistent with acute presentation with potential threat to life or bodily function.  The patient is on the cardiac monitor to evaluate for evidence of arrhythmia and/or significant heart rate changes  88 yo M here with AMS, found down at home. Clinically, concern for acute renal failure 2/2 poor PO intake, possible rhabdo, and prolonged downtime as well as leakage of fluid from wounds in his b/l legs. Pt labs show profound renal failure with K>7, CO2<7, Cr 14. He has produced a small amount of urine. WBC 10.7. BCx sent. CXR clear, CT head shows subacute or chronic SDH - doubt it is causing his current sx.  For his renal failure, pt given 2.5L fluid resuscitation  and started on isotonic bicarb. Bicarb push in addition to insulin,  albuterol, calcium given as well. Dr. Cherylann Ratel consulted, will start CRRT in ICU. ICU consulted. Pt developed progressively worsening mental status and resp status, decision made to intubate for stabilization, and line placement. Broad spectrum abx given in event of possible concomitant sepsis.    FINAL CLINICAL IMPRESSION(S) / ED DIAGNOSES   Final diagnoses:  Acute renal failure, unspecified acute renal failure type (HCC)  Acute respiratory failure with hypoxemia (HCC)  Hyperkalemia  Acidosis     Rx / DC Orders   ED Discharge Orders     None        Note:  This document was prepared using Dragon voice recognition software and may include unintentional dictation errors.   Shaune Pollack, MD 04/10/2023 [phone]

## 2023-03-29 NOTE — ED Triage Notes (Signed)
Patient brought in by EMS from home after being found down by neighbors after unknown time on the ground.  Patient is alert.  He has breakdown on both legs below the knee.  EMS reports rectal temp of 92 and initial blood pressure of 82/36.  He was given a liter of fluid.

## 2023-03-29 NOTE — ED Notes (Signed)
Copywriter, advertising to patient at [employer]

## 2023-03-30 ENCOUNTER — Inpatient Hospital Stay: Payer: Medicare Other

## 2023-03-30 DIAGNOSIS — W19XXXA Unspecified fall, initial encounter: Secondary | ICD-10-CM | POA: Diagnosis present

## 2023-03-30 DIAGNOSIS — I2510 Atherosclerotic heart disease of native coronary artery without angina pectoris: Secondary | ICD-10-CM | POA: Diagnosis present

## 2023-03-30 DIAGNOSIS — E785 Hyperlipidemia, unspecified: Secondary | ICD-10-CM | POA: Diagnosis present

## 2023-03-30 DIAGNOSIS — I350 Nonrheumatic aortic (valve) stenosis: Secondary | ICD-10-CM | POA: Diagnosis present

## 2023-03-30 DIAGNOSIS — E1122 Type 2 diabetes mellitus with diabetic chronic kidney disease: Secondary | ICD-10-CM | POA: Diagnosis present

## 2023-03-30 DIAGNOSIS — L97519 Non-pressure chronic ulcer of other part of right foot with unspecified severity: Secondary | ICD-10-CM | POA: Diagnosis present

## 2023-03-30 DIAGNOSIS — N170 Acute kidney failure with tubular necrosis: Secondary | ICD-10-CM

## 2023-03-30 DIAGNOSIS — S81809A Unspecified open wound, unspecified lower leg, initial encounter: Secondary | ICD-10-CM

## 2023-03-30 DIAGNOSIS — J9601 Acute respiratory failure with hypoxia: Secondary | ICD-10-CM

## 2023-03-30 DIAGNOSIS — Z66 Do not resuscitate: Secondary | ICD-10-CM | POA: Diagnosis not present

## 2023-03-30 DIAGNOSIS — N179 Acute kidney failure, unspecified: Secondary | ICD-10-CM | POA: Diagnosis present

## 2023-03-30 DIAGNOSIS — M6282 Rhabdomyolysis: Secondary | ICD-10-CM | POA: Diagnosis present

## 2023-03-30 DIAGNOSIS — G928 Other toxic encephalopathy: Secondary | ICD-10-CM

## 2023-03-30 DIAGNOSIS — N184 Chronic kidney disease, stage 4 (severe): Secondary | ICD-10-CM

## 2023-03-30 DIAGNOSIS — L97529 Non-pressure chronic ulcer of other part of left foot with unspecified severity: Secondary | ICD-10-CM | POA: Diagnosis present

## 2023-03-30 DIAGNOSIS — E872 Acidosis, unspecified: Secondary | ICD-10-CM | POA: Diagnosis present

## 2023-03-30 DIAGNOSIS — I130 Hypertensive heart and chronic kidney disease with heart failure and stage 1 through stage 4 chronic kidney disease, or unspecified chronic kidney disease: Secondary | ICD-10-CM | POA: Diagnosis present

## 2023-03-30 DIAGNOSIS — Z515 Encounter for palliative care: Secondary | ICD-10-CM | POA: Diagnosis not present

## 2023-03-30 DIAGNOSIS — D638 Anemia in other chronic diseases classified elsewhere: Secondary | ICD-10-CM | POA: Diagnosis present

## 2023-03-30 DIAGNOSIS — E875 Hyperkalemia: Secondary | ICD-10-CM | POA: Diagnosis present

## 2023-03-30 DIAGNOSIS — Z1152 Encounter for screening for COVID-19: Secondary | ICD-10-CM | POA: Diagnosis not present

## 2023-03-30 DIAGNOSIS — R634 Abnormal weight loss: Secondary | ICD-10-CM | POA: Diagnosis present

## 2023-03-30 DIAGNOSIS — R6521 Severe sepsis with septic shock: Secondary | ICD-10-CM | POA: Diagnosis present

## 2023-03-30 DIAGNOSIS — A419 Sepsis, unspecified organism: Secondary | ICD-10-CM | POA: Diagnosis present

## 2023-03-30 DIAGNOSIS — E8809 Other disorders of plasma-protein metabolism, not elsewhere classified: Secondary | ICD-10-CM | POA: Diagnosis present

## 2023-03-30 DIAGNOSIS — L03115 Cellulitis of right lower limb: Secondary | ICD-10-CM | POA: Diagnosis present

## 2023-03-30 DIAGNOSIS — I5043 Acute on chronic combined systolic (congestive) and diastolic (congestive) heart failure: Secondary | ICD-10-CM | POA: Diagnosis present

## 2023-03-30 LAB — RENAL FUNCTION PANEL
Albumin: 2.4 g/dL — ABNORMAL LOW (ref 3.5–5.0)
Anion gap: 21 — ABNORMAL HIGH (ref 5–15)
BUN: 198 mg/dL — ABNORMAL HIGH (ref 8–23)
CO2: 11 mmol/L — ABNORMAL LOW (ref 22–32)
Calcium: 7.9 mg/dL — ABNORMAL LOW (ref 8.9–10.3)
Chloride: 109 mmol/L (ref 98–111)
Creatinine, Ser: 12.73 mg/dL — ABNORMAL HIGH (ref 0.61–1.24)
GFR, Estimated: 3 mL/min — ABNORMAL LOW (ref >60)
Glucose, Bld: 115 mg/dL — ABNORMAL HIGH (ref 70–99)
Phosphorus: 12.9 mg/dL — ABNORMAL HIGH (ref 2.5–4.6)
Potassium: 6.4 mmol/L (ref 3.5–5.1)
Sodium: 141 mmol/L (ref 135–145)

## 2023-03-30 LAB — BLOOD GAS, ARTERIAL
Acid-base deficit: 20.4 mmol/L — ABNORMAL HIGH (ref 0.0–2.0)
Acid-base deficit: 14.1 mmol/L — ABNORMAL HIGH (ref 0.0–2.0)
Bicarbonate: 5.8 mmol/L — ABNORMAL LOW (ref 20.0–28.0)
Bicarbonate: 11.2 mmol/L — ABNORMAL LOW (ref 20.0–28.0)
FIO2: 50 %
MECHVT: 450 mL
O2 Saturation: 79.1 %
O2 Saturation: 99.9 %
PEEP: 5 cmH2O
Patient temperature: 37
Patient temperature: 34.4
RATE: 24 resp/min
pCO2 arterial: <18 mmHg — CL (ref 32–48)
pCO2 arterial: 22 mmHg — ABNORMAL LOW (ref 32–48)
pH, Arterial: 7.17 — CL (ref 7.35–7.45)
pH, Arterial: 7.3 — ABNORMAL LOW (ref 7.35–7.45)
pO2, Arterial: 55 mmHg — ABNORMAL LOW (ref 83–108)
pO2, Arterial: 206 mmHg — ABNORMAL HIGH (ref 83–108)

## 2023-03-30 LAB — HEPATIC FUNCTION PANEL
ALT: 36 U/L (ref 0–44)
AST: 64 U/L — ABNORMAL HIGH (ref 15–41)
Albumin: 2.4 g/dL — ABNORMAL LOW (ref 3.5–5.0)
Alkaline Phosphatase: 109 U/L (ref 38–126)
Bilirubin, Direct: 0.2 mg/dL (ref 0.0–0.2)
Indirect Bilirubin: 0.9 mg/dL (ref 0.3–0.9)
Total Bilirubin: 1.1 mg/dL (ref 0.3–1.2)
Total Protein: 6.1 g/dL — ABNORMAL LOW (ref 6.5–8.1)

## 2023-03-30 LAB — CBC
HCT: 26.1 % — ABNORMAL LOW (ref 39.0–52.0)
Hemoglobin: 9 g/dL — ABNORMAL LOW (ref 13.0–17.0)
MCH: 30 pg (ref 26.0–34.0)
MCHC: 34.5 g/dL (ref 30.0–36.0)
MCV: 87 fL (ref 80.0–100.0)
Platelets: 200 10*3/uL (ref 150–400)
RBC: 3 MIL/uL — ABNORMAL LOW (ref 4.22–5.81)
RDW: 14.7 % (ref 11.5–15.5)
WBC: 11.3 10*3/uL — ABNORMAL HIGH (ref 4.0–10.5)
nRBC: 0 % (ref 0.0–0.2)

## 2023-03-30 LAB — GLUCOSE, CAPILLARY
Glucose-Capillary: 105 mg/dL — ABNORMAL HIGH (ref 70–99)
Glucose-Capillary: 126 mg/dL — ABNORMAL HIGH (ref 70–99)
Glucose-Capillary: 130 mg/dL — ABNORMAL HIGH (ref 70–99)

## 2023-03-30 LAB — MRSA NEXT GEN BY PCR, NASAL: MRSA by PCR Next Gen: NOT DETECTED

## 2023-03-30 LAB — CBG MONITORING, ED: Glucose-Capillary: 90 mg/dL (ref 70–99)

## 2023-03-30 LAB — BRAIN NATRIURETIC PEPTIDE: B Natriuretic Peptide: 3125.5 pg/mL — ABNORMAL HIGH (ref 0.0–100.0)

## 2023-03-30 LAB — LACTIC ACID, PLASMA
Lactic Acid, Venous: 2.5 mmol/L (ref 0.5–1.9)
Lactic Acid, Venous: 1.7 mmol/L (ref 0.5–1.9)

## 2023-03-30 LAB — MAGNESIUM: Magnesium: 2.7 mg/dL — ABNORMAL HIGH (ref 1.7–2.4)

## 2023-03-30 LAB — PHOSPHORUS: Phosphorus: 12.4 mg/dL — ABNORMAL HIGH (ref 2.5–4.6)

## 2023-03-30 MED ORDER — ETOMIDATE 2 MG/ML IV SOLN
INTRAVENOUS | Status: DC | PRN
Start: 2023-03-30 — End: 2023-03-30
  Administered 2023-03-30: 20 mg via INTRAVENOUS

## 2023-03-30 MED ORDER — FENTANYL 2500MCG IN NS 250ML (10MCG/ML) PREMIX INFUSION: 0.0000 ug/h | INTRAVENOUS | Status: DC

## 2023-03-30 MED ORDER — SODIUM BICARBONATE 8.4 % IV SOLN
INTRAVENOUS | Status: AC
  Filled 2023-03-30: qty 100

## 2023-03-30 MED ORDER — MIDAZOLAM HCL 5 MG/5ML IJ SOLN
INTRAMUSCULAR | Status: DC | PRN
  Administered 2023-03-30: 4 mg via INTRAVENOUS

## 2023-03-30 MED ORDER — POLYETHYLENE GLYCOL 3350 17 G PO PACK: 17.0000 g | PACK | Freq: Every day | ORAL | Status: DC

## 2023-03-30 MED ORDER — PRISMASOL BGK 2/3.5 32-2-3.5 MEQ/L EC SOLN
Status: DC
  Filled 2023-03-30 (×13): qty 5000

## 2023-03-30 MED ORDER — ROCURONIUM BROMIDE 50 MG/5ML IV SOLN
INTRAVENOUS | Status: DC | PRN
Start: 2023-03-30 — End: 2023-03-30
  Administered 2023-03-30: 80 mg via INTRAVENOUS

## 2023-03-30 MED ORDER — ATORVASTATIN CALCIUM 20 MG PO TABS
20.0000 mg | ORAL_TABLET | Freq: Every day | ORAL | Status: DC
  Administered 2023-03-30: 20 mg
  Filled 2023-03-30: qty 1

## 2023-03-30 MED ORDER — ROCURONIUM BROMIDE 10 MG/ML (PF) SYRINGE
80.0000 mg | PREFILLED_SYRINGE | Freq: Once | INTRAVENOUS | Status: DC
  Filled 2023-03-30: qty 10

## 2023-03-30 MED ORDER — FAMOTIDINE 20 MG PO TABS: 20.0000 mg | ORAL_TABLET | Freq: Two times a day (BID) | ORAL | Status: DC

## 2023-03-30 MED ORDER — INSULIN ASPART 100 UNIT/ML IJ SOLN
5.0000 [IU] | Freq: Once | INTRAMUSCULAR | Status: DC
  Filled 2023-03-30: qty 1

## 2023-03-30 MED ORDER — SODIUM CHLORIDE 0.9 % IV BOLUS
250.0000 mL | Freq: Once | INTRAVENOUS | Status: AC
  Administered 2023-03-30 (×2): 250 mL via INTRAVENOUS

## 2023-03-30 MED ORDER — VANCOMYCIN HCL IN DEXTROSE 1-5 GM/200ML-% IV SOLN
1000.0000 mg | Freq: Once | INTRAVENOUS | Status: AC
  Administered 2023-03-30: 1000 mg via INTRAVENOUS
  Filled 2023-03-30: qty 200

## 2023-03-30 MED ORDER — VANCOMYCIN VARIABLE DOSE PER UNSTABLE RENAL FUNCTION (PHARMACIST DOSING): Status: DC

## 2023-03-30 MED ORDER — ORAL CARE MOUTH RINSE
15.0000 mL | OROMUCOSAL | Status: DC
  Administered 2023-03-30 (×3): 15 mL via OROMUCOSAL

## 2023-03-30 MED ORDER — ACETAMINOPHEN 325 MG PO TABS: 650.0000 mg | ORAL_TABLET | Freq: Four times a day (QID) | ORAL | Status: DC | PRN

## 2023-03-30 MED ORDER — ORAL CARE MOUTH RINSE: 15.0000 mL | OROMUCOSAL | Status: DC | PRN

## 2023-03-30 MED ORDER — GLYCOPYRROLATE 0.2 MG/ML IJ SOLN: 0.2000 mg | INTRAMUSCULAR | Status: DC | PRN

## 2023-03-30 MED ORDER — ALBUTEROL SULFATE (2.5 MG/3ML) 0.083% IN NEBU
2.5000 mg | INHALATION_SOLUTION | Freq: Once | RESPIRATORY_TRACT | Status: AC
  Administered 2023-03-30: 2.5 mg via RESPIRATORY_TRACT
  Filled 2023-03-30: qty 3

## 2023-03-30 MED ORDER — ANTICOAGULANT SODIUM CITRATE 4% (200MG/5ML) IV SOLN
5.0000 mL | Status: DC | PRN
  Filled 2023-03-30: qty 5

## 2023-03-30 MED ORDER — SODIUM CHLORIDE 0.9 % IV SOLN: 1.0000 g | INTRAVENOUS | Status: DC

## 2023-03-30 MED ORDER — PRISMASOL BGK 2/3.5 32-2-3.5 MEQ/L EC SOLN
Status: DC
  Filled 2023-03-30 (×3): qty 5000

## 2023-03-30 MED ORDER — SODIUM BICARBONATE 8.4 % IV SOLN
INTRAVENOUS | Status: AC
  Filled 2023-03-30: qty 150

## 2023-03-30 MED ORDER — SODIUM BICARBONATE 8.4 % IV SOLN
50.0000 meq | Freq: Once | INTRAVENOUS | Status: AC
  Administered 2023-03-30: 50 meq via INTRAVENOUS
  Filled 2023-03-30: qty 50

## 2023-03-30 MED ORDER — FENTANYL CITRATE PF 50 MCG/ML IJ SOSY
25.0000 ug | PREFILLED_SYRINGE | Freq: Once | INTRAMUSCULAR | Status: AC
  Administered 2023-03-30: 25 ug via INTRAVENOUS
  Filled 2023-03-30: qty 1

## 2023-03-30 MED ORDER — FENTANYL 2500MCG IN NS 250ML (10MCG/ML) PREMIX INFUSION
25.0000 ug/h | INTRAVENOUS | Status: DC
  Administered 2023-03-30: 25 ug/h via INTRAVENOUS
  Filled 2023-03-30: qty 250

## 2023-03-30 MED ORDER — SODIUM BICARBONATE 8.4 % IV SOLN
INTRAVENOUS | Status: DC | PRN
  Administered 2023-03-30: 100 meq via INTRAVENOUS

## 2023-03-30 MED ORDER — ORAL CARE MOUTH RINSE
15.0000 mL | OROMUCOSAL | Status: DC
  Administered 2023-03-30 (×3): 15 mL via OROMUCOSAL
  Filled 2023-03-30 (×3): qty 15

## 2023-03-30 MED ORDER — CHLORHEXIDINE GLUCONATE CLOTH 2 % EX PADS
6.0000 | MEDICATED_PAD | Freq: Every day | CUTANEOUS | Status: DC
  Administered 2023-03-30: 6 via TOPICAL

## 2023-03-30 MED ORDER — DOCUSATE SODIUM 100 MG PO CAPS: 100.0000 mg | ORAL_CAPSULE | Freq: Two times a day (BID) | ORAL | Status: DC | PRN

## 2023-03-30 MED ORDER — INSULIN ASPART 100 UNIT/ML IJ SOLN
10.0000 [IU] | Freq: Once | INTRAMUSCULAR | Status: AC
  Administered 2023-03-30: 10 [IU] via INTRAVENOUS
  Filled 2023-03-30: qty 1

## 2023-03-30 MED ORDER — SODIUM CHLORIDE 0.9 % IV SOLN
2.0000 g | Freq: Two times a day (BID) | INTRAVENOUS | Status: DC
  Filled 2023-03-30: qty 12.5

## 2023-03-30 MED ORDER — MIDAZOLAM HCL 2 MG/2ML IJ SOLN
4.0000 mg | Freq: Once | INTRAMUSCULAR | Status: DC
  Filled 2023-03-30: qty 4

## 2023-03-30 MED ORDER — POLYVINYL ALCOHOL 1.4 % OP SOLN: 1.0000 [drp] | Freq: Four times a day (QID) | OPHTHALMIC | Status: DC | PRN

## 2023-03-30 MED ORDER — NOREPINEPHRINE 4 MG/250ML-% IV SOLN
0.0000 ug/min | INTRAVENOUS | Status: DC
  Administered 2023-03-30: 2 ug/min via INTRAVENOUS
  Filled 2023-03-30: qty 250

## 2023-03-30 MED ORDER — ACETAMINOPHEN 650 MG RE SUPP: 650.0000 mg | Freq: Four times a day (QID) | RECTAL | Status: DC | PRN

## 2023-03-30 MED ORDER — DEXTROSE 50 % IV SOLN
1.0000 | Freq: Once | INTRAVENOUS | Status: AC
  Administered 2023-03-30: 50 mL via INTRAVENOUS
  Filled 2023-03-30: qty 50

## 2023-03-30 MED ORDER — SODIUM ZIRCONIUM CYCLOSILICATE 10 G PO PACK
10.0000 g | PACK | Freq: Once | ORAL | Status: DC
  Filled 2023-03-30: qty 1

## 2023-03-30 MED ORDER — DOCUSATE SODIUM 50 MG/5ML PO LIQD
100.0000 mg | Freq: Two times a day (BID) | ORAL | Status: DC
  Administered 2023-03-30: 100 mg
  Filled 2023-03-30: qty 10

## 2023-03-30 MED ORDER — FENTANYL BOLUS VIA INFUSION: 25.0000 ug | INTRAVENOUS | Status: DC | PRN

## 2023-03-30 MED ORDER — ETOMIDATE 2 MG/ML IV SOLN
20.0000 mg | Freq: Once | INTRAVENOUS | Status: DC
  Filled 2023-03-30: qty 10

## 2023-03-30 MED ORDER — MIDAZOLAM HCL 2 MG/2ML IJ SOLN
1.0000 mg | INTRAMUSCULAR | Status: DC | PRN
  Administered 2023-03-30: 1 mg via INTRAVENOUS
  Administered 2023-03-30 (×4): 2 mg via INTRAVENOUS
  Filled 2023-03-30 (×5): qty 2

## 2023-03-30 MED ORDER — CALCIUM GLUCONATE-NACL 1-0.675 GM/50ML-% IV SOLN
1.0000 g | Freq: Once | INTRAVENOUS | Status: AC
  Administered 2023-03-30: 1000 mg via INTRAVENOUS
  Filled 2023-03-30: qty 50

## 2023-03-30 MED ORDER — VANCOMYCIN HCL 1250 MG/250ML IV SOLN
1250.0000 mg | INTRAVENOUS | Status: DC
  Filled 2023-03-30: qty 250

## 2023-03-30 MED ORDER — FENTANYL CITRATE PF 50 MCG/ML IJ SOSY
100.0000 ug | PREFILLED_SYRINGE | Freq: Once | INTRAMUSCULAR | Status: AC
  Administered 2023-03-30: 100 ug via INTRAVENOUS
  Filled 2023-03-30: qty 2

## 2023-03-30 MED ORDER — NOREPINEPHRINE 16 MG/250ML-% IV SOLN
0.0000 ug/min | INTRAVENOUS | Status: DC
  Administered 2023-03-30: 14 ug/min via INTRAVENOUS
  Filled 2023-03-30: qty 250

## 2023-03-30 MED ORDER — POLYETHYLENE GLYCOL 3350 17 G PO PACK: 17.0000 g | PACK | Freq: Every day | ORAL | Status: DC | PRN

## 2023-03-30 MED ORDER — HEPARIN SODIUM (PORCINE) 1000 UNIT/ML DIALYSIS: 1000.0000 [IU] | INTRAMUSCULAR | Status: DC | PRN

## 2023-03-30 MED ORDER — PANTOPRAZOLE SODIUM 40 MG IV SOLR: 40.0000 mg | Freq: Every day | INTRAVENOUS | Status: DC

## 2023-03-30 MED ORDER — PANTOPRAZOLE SODIUM 40 MG IV SOLR
40.0000 mg | INTRAVENOUS | Status: DC
  Administered 2023-03-30: 40 mg via INTRAVENOUS
  Filled 2023-03-30: qty 10

## 2023-03-30 MED ORDER — VASOPRESSIN 20 UNITS/100 ML INFUSION FOR SHOCK
0.0000 [IU]/min | INTRAVENOUS | Status: DC
  Filled 2023-03-30: qty 100

## 2023-03-30 MED ORDER — FENTANYL BOLUS VIA INFUSION: 100.0000 ug | INTRAVENOUS | Status: DC | PRN

## 2023-03-30 MED ORDER — GLYCOPYRROLATE 1 MG PO TABS: 1.0000 mg | ORAL_TABLET | ORAL | Status: DC | PRN

## 2023-04-12 ENCOUNTER — Ambulatory Visit (INDEPENDENT_AMBULATORY_CARE_PROVIDER_SITE_OTHER): Payer: Medicare Other | Admitting: Nurse Practitioner

## 2023-04-26 ENCOUNTER — Ambulatory Visit: Payer: Medicare Other | Admitting: Internal Medicine

## 2023-04-26 ENCOUNTER — Other Ambulatory Visit: Payer: Medicare Other

## 2023-04-26 ENCOUNTER — Ambulatory Visit: Payer: Medicare Other

## 2023-04-29 NOTE — Progress Notes (Signed)
Initial Nutrition Assessment  DOCUMENTATION CODES:   Non-severe (moderate) malnutrition in context of chronic illness  INTERVENTION:   If plan is for full care, recommend:  Vital 1.2@50ml /hr- Initiate at 45ml/hr and increase by 50ml/hr q 8 hours until goal rate is reached.   ProSource TF 20- Give 60ml BID via tube, each supplement provides 80kcal and 20g of protein.   Free water flushes 30ml q4 hours to maintain tube patency   Regimen provides 1600kcal/day, 130g/day protein and 1172ml/day of free water.   Rena-vit daily via tube   Pt at high refeed risk; recommend monitor potassium, magnesium and phosphorus labs daily until stable  Daily weights   NUTRITION DIAGNOSIS:   Moderate Malnutrition related to chronic illness as evidenced by moderate fat depletion, moderate muscle depletion.  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  Pt sedated and ventilated. OGT in place. Pt receiving CRRT. Per chart review, family reports pt with poor oral intake and wt loss pta; wife reports that pt has lost ~50lbs since December. Per chart, pt appears to have gained 28lbs from 03/2022-09/2022 and then noted to have lost 8lbs(4%) over the 3 months if his admission weight is correct. Will plan to initiate tube feeds if plan is for full aggressive care. Pt is likely at refeed risk.     Medications  reviewed and include: colace, protonix, miralax, cefepime, levophed, vancomycin   Labs reviewed: K 6.4(H), BUN 198(H), creat 12.73(H), P 12.9(H), Mg 2.7(H) BNP- 3125.5(H)- 8/2 Wbc- 11.3(H), Hgb 9.0(L), Hct 26.1(L) Cbgs- 130, 126, 105, 90 x 24hrs  Patient is currently intubated on ventilator support MV: 10.0 L/min Temp (24hrs), Avg:93.7 F (34.3 C), Min:92 F (33.3 C), Max:95.2 F (35.1 C)  Propofol: none   MAP- >61mmHg   UOP- 70ml   NUTRITION - FOCUSED PHYSICAL EXAM:  Flowsheet Row Most Recent Value  Orbital Region Mild depletion  Upper Arm Region Severe depletion  Thoracic and Lumbar Region Severe depletion  Buccal Region Mild depletion  Temple Region Mild depletion  Clavicle Bone Region Moderate depletion  Clavicle and Acromion Bone Region Moderate depletion  Scapular Bone Region Moderate depletion  Dorsal Hand Unable to assess  Patellar Region Moderate depletion  Anterior Thigh Region Moderate depletion  Posterior Calf Region Moderate depletion  Edema (RD Assessment) None  Hair Reviewed  Eyes Reviewed  Mouth Reviewed  Skin Reviewed  Nails Reviewed   Diet Order:   Diet Order             Diet NPO time specified  Diet effective now                  EDUCATION NEEDS:   No education needs have been identified at this time  Skin:  Skin Assessment: Reviewed RN Assessment (wounds BLE, cellulitis)  Last BM:  pta  Height:   Ht Readings from Last 1 Encounters:  04/17/2023 5\' 6"  (1.676 m)    Weight:  Wt Readings from Last 1 Encounters:  04/18/2023 80.6 kg    Ideal Body Weight:  64.5 kg  BMI:  Body mass index is 28.68 kg/m.  Estimated Nutritional Needs:   Kcal:  1342kcal/day  Protein:  115-130g/day  Fluid:  UOP +1L  Betsey Holiday MS, RD, LDN Please refer to [facility] for RD and/or RD on-call/weekend/after hours pager

## 2023-04-29 NOTE — Progress Notes (Signed)
PHARMACY -  BRIEF ANTIBIOTIC NOTE   Pharmacy has received consult(s) for Vanc, Cefepime from an ED provider.  The patient's profile has been reviewed for ht/wt/allergies/indication/available labs.    One time order(s) placed for Vancomycin 2 gm IV X 1 and Cefepime 2 gm IV X 1.   Further antibiotics/pharmacy consults should be ordered by admitting physician if indicated.                       Thank you, , D 04/25/2023  12:30 AM

## 2023-04-29 NOTE — Progress Notes (Signed)
Patient comes in severe AKI, metabolic acidosis, and hyperkalemia.   Will proceed with CRRT using 2k bath and fluid removal goal of net even.

## 2023-04-29 NOTE — Sepsis Progress Note (Signed)
Elink monitoring for the code sepsis protocol.  

## 2023-04-29 NOTE — Progress Notes (Signed)
Pharmacy Antibiotic Note  Bill Peterson is a 88 y.o. male w/ PMH of DM, HLD, HTN, CAD, CHF, CKD, Gastric Stromal tumor admitted on 04/27/2023 with  wound infection .  Pharmacy has been consulted for vancomycin and cefepime dosing.   Pt is acute on chronic renal impairment now on CRRT  Plan:  1) adjust cefepime 2 grams IV every 12 hours  2) s/p vancomycin 2000 mg IV x 1 loading dose --start vancomycin 15 mg/kg IV every 24 hours --trough prior to 3rd or 4th maintenance dose --goal vancomycin trough 10 - 15 mcg/mL  Height: 5\' 6"  (167.6 cm) Weight: 80.6 kg (177 lb 11.1 oz) IBW/kg (Calculated) : 63.8  Temp (24hrs), Avg:94 F (34.4 C), Min:92 F (33.3 C), Max:95.2 F (35.1 C)  Recent Labs  Lab 04/04/2023 2342 04/12/2023 2345 03/31/2023 0204 04/02/2023 0449  WBC  --  10.7*  --  11.3*  CREATININE  --  14.20*  --  12.73*  LATICACIDVEN 1.5  --  2.3* 2.5*    Estimated Creatinine Clearance: 4.2 mL/min (A) (by C-G formula based on SCr of 12.73 mg/dL (H)).    Allergies  Allergen Reactions   Heparin     REACTION: Unspecified   Repaglinide Other (See Comments)    Increased appetite Increased appetite Increased appetite     Antimicrobials this admission: 08/02 cefepime >> 08/02 vancomycin >>  Microbiology results: 08/01 BCx: pending  Thank you for allowing pharmacy to be a part of this patient's care.  Lowella Bandy 04/05/2023 7:15 AM

## 2023-04-29 NOTE — IPAL (Signed)
  Interdisciplinary Goals of Care Family Meeting   Date carried out: 04/14/2023  Location of the meeting: Bedside  Member's involved: Physician, Bedside Registered Nurse, and Family Member or next of kin  Durable Power of Attorney or acting medical decision maker: Patient's wife, Robert Bellow    Discussion: We discussed goals of care for Bill Peterson .  I explained to her that he is experiencing multi-organ failure driven by renal failure secondary to rhabdo. He's intubated secondary to altered mental status and is on vasopressors for blood pressor support given shock. Explained that with multi-organ failure, prognosis is extremely guarded. Wife is tearful and sad. She is awaiting her daughter who is on her way from out of town. She understands that Mr. Heyde is unlikely to survive this hospitalization and she would not want him to suffer. She has a son who isn't able to present secondary to PTSD, and another son who [place] in the [address]. We will maintain the treatment plan as is for now, and revisit goals of care once the daughter presents to the bedside. Chaplain at the bedside providing emotional support.  Code status:   Code Status: Full Code   Disposition: Continue current acute care  Time spent for the meeting: 15 minutes    Raechel Chute, MD  04/02/2023, 11:05 AM

## 2023-04-29 NOTE — Consult Note (Addendum)
WOC Nurse Consult Note: Reason for Consult: Consult requested for cellulitis to bilat legs.  Pt was previously followed by the vascular team for [facility] boots, according to progress notes, "these were discontinued on 7/9 because wounds were healed." He was found down at home with full thickness wounds to bilat legs, large amt yellow leakage, and soiled foreign objects from the floor had adhered to his skin.  Wound type: Bilat anterior and posterior calves, from below knees to ankles, with patchy areas of full thickness skin loss, red and macerated with mod amt yellow drainage, strong odor, patchy areas of loose scabbed peeling skin.  Dressing procedure/placement/frequency: Applied bilat Una boots as previously used by the vascular team as an outpatient. Topical treatment orders provided for bedside nurses: Leave bilat Una boots in place, WOC team will change Q FRI. Pt is critically ill with multiple systemic factors which can impair healing.  He is in Prevalon boots bilat to reduce pressure.  WOC team will plan to change Una boots next Fri. Thank-you,  Cammie Mcgee MSN, RN, CWOCN, Council, CNS [phone]

## 2023-04-29 NOTE — Progress Notes (Signed)
   04/17/2023 0300  Spiritual Encounters  Type of Visit Initial  Care provided to: Family  Referral source Nurse (RN/NT/LPN)  Reason for visit Urgent spiritual support  OnCall Visit Yes  Spiritual Framework  Presenting Themes Meaning/purpose/sources of inspiration;Rituals and practive  Family Stress Factors Major life changes  Interventions  Spiritual Care Interventions Made Established relationship of care and support;Compassionate presence;Reflective listening;Explored ethical dilemma;Encouragement;Prayer  Intervention Outcomes  Outcomes Connection to spiritual care;Reduced anxiety;Reduced fear  Spiritual Care Plan  Spiritual Care Issues Still Outstanding No further spiritual care needs at this time (see row info)     04/12/2023 0300  Spiritual Encounters  Type of Visit Initial  Care provided to: Family  Referral source Nurse (RN/NT/LPN)  Reason for visit Urgent spiritual support  OnCall Visit Yes  Spiritual Framework  Presenting Themes Meaning/purpose/sources of inspiration;Rituals and practive  Family Stress Factors Major life changes  Interventions  Spiritual Care Interventions Made Established relationship of care and support;Compassionate presence;Reflective listening;Explored ethical dilemma;Encouragement;Prayer  Intervention Outcomes  Outcomes Connection to spiritual care;Reduced anxiety;Reduced fear  Spiritual Care Plan  Spiritual Care Issues Still Outstanding No further spiritual care needs at this time (see row info)     04/10/2023 0300  Spiritual Encounters  Type of Visit Initial  Care provided to: Family  Referral source Nurse (RN/NT/LPN)  Reason for visit Urgent spiritual support  OnCall Visit Yes  Spiritual Framework  Presenting Themes Meaning/purpose/sources of inspiration;Rituals and practive  Family Stress Factors Major life changes  Interventions  Spiritual Care Interventions Made Established relationship of care and support;Compassionate  presence;Reflective listening;Explored ethical dilemma;Encouragement;Prayer  Intervention Outcomes  Outcomes Connection to spiritual care;Reduced anxiety;Reduced fear  Spiritual Care Plan  Spiritual Care Issues Still Outstanding No further spiritual care needs at this time (see row info)   Page by medical staff to support family members of patient. The pt was getting a intubation and the family was in distress. So I pray and steady with the family until the medical team was done. I let the family know that we are here 24/7 if they need pastoral care.

## 2023-04-29 NOTE — Death Summary Note (Signed)
DEATH SUMMARY   Patient Details  Name: Bill Peterson MRN: [id number] DOB: 1934/10/03  Admission/Discharge Information   Admit Date:  April 15, 2023  Date of Death: 16-Apr-2023  Time of Death: 15:55  Length of Stay: 0  Referring Physician: Lauro Regulus, MD   Reason(s) for Hospitalization  Renal failure, rhabdomyolysis.  Diagnoses  Preliminary cause of death:  Secondary Diagnoses (including complications and co-morbidities):  Principal Problem:   Acute renal failure superimposed on chronic kidney disease (HCC) Active Problems:   Acute respiratory failure with hypoxemia (HCC)   Toxic metabolic encephalopathy   Metabolic acidosis   Multiple open wounds of lower extremity, initial encounter   Brief Hospital Course (including significant findings, care, treatment, and services provided and events leading to death)  Bill Peterson is a 88 y.o. year old male who has a past medical history of CKD and presented to the hospital following being found down. He was noted to have renal failure and respiratory failure, requiring intubation and initiation of mechanical ventilation. He was also started on CVVHD for renal replacement therapy and vasopressors for blood pressure support. He had developed rhabdomyolysis contributing to his renal failure, and had multiple lower extremity ulcers that were potentially infected. Patient's family were updated regarding his overall critical condition and decision was made by his wife to withdraw care. Patient passed in the presence of his loving family and care was withdrawn and compassionate extubation performed.    Pertinent Labs and Studies  Significant Diagnostic Studies CT ABDOMEN PELVIS WO CONTRAST  Result Date: 2023/04/16 CLINICAL DATA:  Sepsis.  Found down. EXAM: CT ABDOMEN AND PELVIS WITHOUT CONTRAST TECHNIQUE: Multidetector CT imaging of the abdomen and pelvis was performed following the standard protocol without IV contrast. RADIATION DOSE  REDUCTION: This exam was performed according to the departmental dose-optimization program which includes automated exposure control, adjustment of the mA and/or kV according to patient size and/or use of iterative reconstruction technique. COMPARISON:  05/20/2010 FINDINGS: Lower chest: Streaky density in the lower lungs attributed atelectasis. Cardiomegaly and extensive atheromatous calcification. Aortic calcification. Hepatobiliary: No focal liver abnormality.Layering gallstones. No acute biliary finding or biliary dilatation. Pancreas: Unremarkable. Spleen: Unremarkable. Adrenals/Urinary Tract: Negative adrenals. No hydronephrosis or ureteral stone. Symmetric kidneys with nonspecific perinephric stranding. Punctate left renal calculus. The bladder is collapsed by a Foley catheter. Stomach/Bowel: Postoperative bowel in the right lower quadrant at site of enteroenteric anastomosis. Right inguinal hernia containing loops of nonobstructed bowel. No bowel obstruction or visible inflammation Vascular/Lymphatic: No acute vascular abnormality. Extensive atheromatous calcification. No mass or adenopathy. Reproductive:Enlarged prostate up lifting the bladder base. Other: No ascites or pneumoperitoneum. Musculoskeletal: No acute abnormalities. Generalized degeneration of the spine with lower lumbar laminectomies. IMPRESSION: 1. No acute intra-abdominal finding. 2. Atherosclerosis, cholelithiasis, and right inguinal hernia containing bowel. Electronically Signed   By: Tiburcio Pea M.D.   On: April 16, 2023 04:57   DG Chest Portable 1 View  Result Date: 2023-04-16 CLINICAL DATA:  Shortness of breath EXAM: PORTABLE CHEST 1 VIEW COMPARISON:  15-Apr-2023 FINDINGS: Endotracheal intubation with endotracheal tube tip 12 mm above the carina. An enteric tube reaches the stomach. Stable cardiomegaly. Indistinct streaky density in the lower lungs, on contemporaneous abdominal CT this correlates with atelectasis at the lung bases. No  visible effusion, edema, or pneumothorax. IMPRESSION: 1. Endotracheal tube with tip 12 mm above the carina. 2. Located enteric tube. 3. Atelectasis at the lung bases. Electronically Signed   By: Tiburcio Pea M.D.   On: 16-Apr-2023 04:36   DG  Lab 04/08/2023 2345 04/06/2023 0449  WBC 10.7* 11.3*  NEUTROABS 9.9*  --   HGB 9.4* 9.0*  HCT 26.8* 26.1*  MCV 88.4 87.0  PLT 213 200   Cardiac Enzymes: Recent Labs  Lab 04/21/2023 2345  CKTOTAL 1,073*   Sepsis Labs: Recent Labs  Lab 04/27/2023 2342 04/09/2023 2345 04/07/2023 0204 04/05/2023 0449 04/10/2023 1028  WBC  --  10.7*  --  11.3*  --   LATICACIDVEN 1.5  --  2.3* 2.5* 1.7    Procedures/Operations  Temporary dialysis catheter placement Endotracheal intubation.     04/28/2023, 3:57 PM  Lab 04/08/2023 2345 04/06/2023 0449  WBC 10.7* 11.3*  NEUTROABS 9.9*  --   HGB 9.4* 9.0*  HCT 26.8* 26.1*  MCV 88.4 87.0  PLT 213 200   Cardiac Enzymes: Recent Labs  Lab 04/21/2023 2345  CKTOTAL 1,073*   Sepsis Labs: Recent Labs  Lab 04/27/2023 2342 04/09/2023 2345 04/07/2023 0204 04/05/2023 0449 04/10/2023 1028  WBC  --  10.7*  --  11.3*  --   LATICACIDVEN 1.5  --  2.3* 2.5* 1.7    Procedures/Operations  Temporary dialysis catheter placement Endotracheal intubation.     04/28/2023, 3:57 PM  DEATH SUMMARY   Patient Details  Name: Bill Peterson MRN: [id number] DOB: 1934/10/03  Admission/Discharge Information   Admit Date:  April 15, 2023  Date of Death: 16-Apr-2023  Time of Death: 15:55  Length of Stay: 0  Referring Physician: Lauro Regulus, MD   Reason(s) for Hospitalization  Renal failure, rhabdomyolysis.  Diagnoses  Preliminary cause of death:  Secondary Diagnoses (including complications and co-morbidities):  Principal Problem:   Acute renal failure superimposed on chronic kidney disease (HCC) Active Problems:   Acute respiratory failure with hypoxemia (HCC)   Toxic metabolic encephalopathy   Metabolic acidosis   Multiple open wounds of lower extremity, initial encounter   Brief Hospital Course (including significant findings, care, treatment, and services provided and events leading to death)  Bill Peterson is a 88 y.o. year old male who has a past medical history of CKD and presented to the hospital following being found down. He was noted to have renal failure and respiratory failure, requiring intubation and initiation of mechanical ventilation. He was also started on CVVHD for renal replacement therapy and vasopressors for blood pressure support. He had developed rhabdomyolysis contributing to his renal failure, and had multiple lower extremity ulcers that were potentially infected. Patient's family were updated regarding his overall critical condition and decision was made by his wife to withdraw care. Patient passed in the presence of his loving family and care was withdrawn and compassionate extubation performed.    Pertinent Labs and Studies  Significant Diagnostic Studies CT ABDOMEN PELVIS WO CONTRAST  Result Date: 2023/04/16 CLINICAL DATA:  Sepsis.  Found down. EXAM: CT ABDOMEN AND PELVIS WITHOUT CONTRAST TECHNIQUE: Multidetector CT imaging of the abdomen and pelvis was performed following the standard protocol without IV contrast. RADIATION DOSE  REDUCTION: This exam was performed according to the departmental dose-optimization program which includes automated exposure control, adjustment of the mA and/or kV according to patient size and/or use of iterative reconstruction technique. COMPARISON:  05/20/2010 FINDINGS: Lower chest: Streaky density in the lower lungs attributed atelectasis. Cardiomegaly and extensive atheromatous calcification. Aortic calcification. Hepatobiliary: No focal liver abnormality.Layering gallstones. No acute biliary finding or biliary dilatation. Pancreas: Unremarkable. Spleen: Unremarkable. Adrenals/Urinary Tract: Negative adrenals. No hydronephrosis or ureteral stone. Symmetric kidneys with nonspecific perinephric stranding. Punctate left renal calculus. The bladder is collapsed by a Foley catheter. Stomach/Bowel: Postoperative bowel in the right lower quadrant at site of enteroenteric anastomosis. Right inguinal hernia containing loops of nonobstructed bowel. No bowel obstruction or visible inflammation Vascular/Lymphatic: No acute vascular abnormality. Extensive atheromatous calcification. No mass or adenopathy. Reproductive:Enlarged prostate up lifting the bladder base. Other: No ascites or pneumoperitoneum. Musculoskeletal: No acute abnormalities. Generalized degeneration of the spine with lower lumbar laminectomies. IMPRESSION: 1. No acute intra-abdominal finding. 2. Atherosclerosis, cholelithiasis, and right inguinal hernia containing bowel. Electronically Signed   By: Tiburcio Pea M.D.   On: April 16, 2023 04:57   DG Chest Portable 1 View  Result Date: 2023-04-16 CLINICAL DATA:  Shortness of breath EXAM: PORTABLE CHEST 1 VIEW COMPARISON:  15-Apr-2023 FINDINGS: Endotracheal intubation with endotracheal tube tip 12 mm above the carina. An enteric tube reaches the stomach. Stable cardiomegaly. Indistinct streaky density in the lower lungs, on contemporaneous abdominal CT this correlates with atelectasis at the lung bases. No  visible effusion, edema, or pneumothorax. IMPRESSION: 1. Endotracheal tube with tip 12 mm above the carina. 2. Located enteric tube. 3. Atelectasis at the lung bases. Electronically Signed   By: Tiburcio Pea M.D.   On: 16-Apr-2023 04:36   DG  DEATH SUMMARY   Patient Details  Name: Bill Peterson MRN: [id number] DOB: 1934/10/03  Admission/Discharge Information   Admit Date:  April 15, 2023  Date of Death: 16-Apr-2023  Time of Death: 15:55  Length of Stay: 0  Referring Physician: Lauro Regulus, MD   Reason(s) for Hospitalization  Renal failure, rhabdomyolysis.  Diagnoses  Preliminary cause of death:  Secondary Diagnoses (including complications and co-morbidities):  Principal Problem:   Acute renal failure superimposed on chronic kidney disease (HCC) Active Problems:   Acute respiratory failure with hypoxemia (HCC)   Toxic metabolic encephalopathy   Metabolic acidosis   Multiple open wounds of lower extremity, initial encounter   Brief Hospital Course (including significant findings, care, treatment, and services provided and events leading to death)  Bill Peterson is a 88 y.o. year old male who has a past medical history of CKD and presented to the hospital following being found down. He was noted to have renal failure and respiratory failure, requiring intubation and initiation of mechanical ventilation. He was also started on CVVHD for renal replacement therapy and vasopressors for blood pressure support. He had developed rhabdomyolysis contributing to his renal failure, and had multiple lower extremity ulcers that were potentially infected. Patient's family were updated regarding his overall critical condition and decision was made by his wife to withdraw care. Patient passed in the presence of his loving family and care was withdrawn and compassionate extubation performed.    Pertinent Labs and Studies  Significant Diagnostic Studies CT ABDOMEN PELVIS WO CONTRAST  Result Date: 2023/04/16 CLINICAL DATA:  Sepsis.  Found down. EXAM: CT ABDOMEN AND PELVIS WITHOUT CONTRAST TECHNIQUE: Multidetector CT imaging of the abdomen and pelvis was performed following the standard protocol without IV contrast. RADIATION DOSE  REDUCTION: This exam was performed according to the departmental dose-optimization program which includes automated exposure control, adjustment of the mA and/or kV according to patient size and/or use of iterative reconstruction technique. COMPARISON:  05/20/2010 FINDINGS: Lower chest: Streaky density in the lower lungs attributed atelectasis. Cardiomegaly and extensive atheromatous calcification. Aortic calcification. Hepatobiliary: No focal liver abnormality.Layering gallstones. No acute biliary finding or biliary dilatation. Pancreas: Unremarkable. Spleen: Unremarkable. Adrenals/Urinary Tract: Negative adrenals. No hydronephrosis or ureteral stone. Symmetric kidneys with nonspecific perinephric stranding. Punctate left renal calculus. The bladder is collapsed by a Foley catheter. Stomach/Bowel: Postoperative bowel in the right lower quadrant at site of enteroenteric anastomosis. Right inguinal hernia containing loops of nonobstructed bowel. No bowel obstruction or visible inflammation Vascular/Lymphatic: No acute vascular abnormality. Extensive atheromatous calcification. No mass or adenopathy. Reproductive:Enlarged prostate up lifting the bladder base. Other: No ascites or pneumoperitoneum. Musculoskeletal: No acute abnormalities. Generalized degeneration of the spine with lower lumbar laminectomies. IMPRESSION: 1. No acute intra-abdominal finding. 2. Atherosclerosis, cholelithiasis, and right inguinal hernia containing bowel. Electronically Signed   By: Tiburcio Pea M.D.   On: April 16, 2023 04:57   DG Chest Portable 1 View  Result Date: 2023-04-16 CLINICAL DATA:  Shortness of breath EXAM: PORTABLE CHEST 1 VIEW COMPARISON:  15-Apr-2023 FINDINGS: Endotracheal intubation with endotracheal tube tip 12 mm above the carina. An enteric tube reaches the stomach. Stable cardiomegaly. Indistinct streaky density in the lower lungs, on contemporaneous abdominal CT this correlates with atelectasis at the lung bases. No  visible effusion, edema, or pneumothorax. IMPRESSION: 1. Endotracheal tube with tip 12 mm above the carina. 2. Located enteric tube. 3. Atelectasis at the lung bases. Electronically Signed   By: Tiburcio Pea M.D.   On: 16-Apr-2023 04:36   DG

## 2023-04-29 NOTE — Procedures (Signed)
Central Venous Catheter Insertion Procedure Note  Bill Peterson  [id number]  1935/06/30  Date:04/04/2023  Time:5:25 AM   Provider Performing: L [place]   Procedure: Insertion of Non-tunneled Central Venous [phone]) with US guidance ([id number])   Indication(s) Medication administration and Hemodialysis  Consent Risks of the procedure as well as the alternatives and risks of each were explained to the patient and/or caregiver.  Consent for the procedure was obtained and is signed in the bedside chart  Anesthesia Topical only with 1% lidocaine , fentanyl drip  Timeout Verified patient identification, verified procedure, site/side was marked, verified correct patient position, special equipment/implants available, medications/allergies/relevant history reviewed, required imaging and test results available.  Sterile Technique Maximal sterile technique including full sterile barrier drape, hand hygiene, sterile gown, sterile gloves, mask, hair covering, sterile ultrasound probe cover (if used).  Procedure Description Area of catheter insertion was cleaned with chlorhexidine and draped in sterile fashion.  With real-time ultrasound guidance a HD catheter was placed into the left femoral vein. Nonpulsatile blood flow and easy flushing noted in all ports.  The catheter was sutured in place and sterile dressing applied.  Complications/Tolerance None; patient tolerated the procedure well. Chest X-ray is ordered to verify placement for internal jugular or subclavian cannulation.   Chest x-ray is not ordered for femoral cannulation.  EBL Minimal  Specimen(s) None  Betsey Holiday, AGACNP-BC Acute Care Nurse Practitioner [facility]   [phone] / [phone] Please see Amion for details.

## 2023-04-29 NOTE — IPAL (Signed)
  Interdisciplinary Goals of Care Family Meeting   Date carried out: 04/27/2023  Location of the meeting: Bedside  Member's involved: Physician and Family Member or next of kin  Durable Power of Attorney or acting medical decision maker: Wife, Jaran Sainz    Discussion: We discussed goals of care for [employer] .  Explained overall critical illness, multi-organ failure, circulatory shock, and renal failure. Explained overall dismal prognosis and patient's wife is understanding. She would like to change goals of care and to focus on patient's comfort.  Code status:   Code Status: Full Code   Disposition: In-patient comfort care  Time spent for the meeting: 10 minutes    Raechel Chute, MD  04/04/2023, 3:16 PM

## 2023-04-29 NOTE — Progress Notes (Signed)
Pharmacy Antibiotic Note  Bill Peterson is a 88 y.o. male admitted on 04/01/2023 with  wound infection .  Pharmacy has been consulted for Vanc, Cefepime dosing.   Pt is acute on chronic renal impairment.   Plan: Cefepime 2 gm IV X 1 given in ED on 8/2 @ 0133. Cefepime 1 gm IV Q24H ordered to start on 8/3 @ 0100.   Vancomycin 1 gm IV X 1 given in ED on 8/2 @ 0024 followed by additional Vanc 1 gm IV X 1 to make total loading dose of 2 gm.  - Will dose by levels since pt unstable renal function - Will draw random vanc ~ 24 hrs after 1st dose on 8/3 @ 0000. - Goal level :  15 - 20 mcg/mL   Height: 5\' 6"  (167.6 cm) Weight: 80.6 kg (177 lb 11.1 oz) IBW/kg (Calculated) : 63.8  Temp (24hrs), Avg:94 F (34.4 C), Min:92 F (33.3 C), Max:95.2 F (35.1 C)  Recent Labs  Lab 04/01/2023 2342 04/17/2023 2345 04/09/2023 0204 04/01/2023 0449  WBC  --  10.7*  --  11.3*  CREATININE  --  14.20*  --   --   LATICACIDVEN 1.5  --  2.3* 2.5*    Estimated Creatinine Clearance: 3.8 mL/min (A) (by C-G formula based on SCr of 14.2 mg/dL (H)).    Allergies  Allergen Reactions   Heparin     REACTION: Unspecified   Repaglinide Other (See Comments)    Increased appetite Increased appetite Increased appetite     Antimicrobials this admission:   >>    >>   Dose adjustments this admission:   Microbiology results:  BCx:   UCx:    Sputum:    MRSA PCR:   Thank you for allowing pharmacy to be a part of this patient's care.  , D 04/13/2023 6:02 AM

## 2023-04-29 NOTE — Progress Notes (Signed)
   2023-04-08 1621  Attending Physican Contact  Attending Physician Notified Y  Attending Physician (First and Last Name) Dr Aundria Rud, Lianne Bushy  Post Mortem Checklist  Date of Death 2023/04/08  Time of Death 1555  Pronounced By Dr, Lillie Columbia  Next of kin notified Yes (Wife and family at bedside.)  Name of next of kin notified of death Langdon Crosson  Contact Person's Relationship to Patient Spouse  Contact Person's Phone Number [phone]  Family Communication Notes Pt wife and families at bedside the whole time.  Was the patient a No Code Blue or a Limited Code Blue? No (Comfort care)  Did the patient die unattended? No  Patient restrained? Not applicable  Height 5\' 6"  (1.676 m)  Weight 80.6 kg  [facility] (previously known as [facility])  Notification Date 2023-04-08  Notification Time 1621  [facility] Number [phone]  Is patient a potential donor? N  Autopsy  Autopsy requested by MD or Family ( Non ME Case) N/A  Patient and Hospital Property Returned  Patient is satisfied that all belongings have been returned? Not applicable  Dermatherapy linen/gowns NOT sent with patient or transporter Not applicable  Medical Examiner  Is this a medical examiner's case? N  Funeral Home  Planned location of pickup [facility]

## 2023-04-29 NOTE — Progress Notes (Signed)
   04/11/2023 1200  Spiritual Encounters  Type of Visit Initial  Care provided to: Pt and family  Conversation partners present during encounter Nurse;Physician  Referral source Nurse (RN/NT/LPN)  Reason for visit Urgent spiritual support  OnCall Visit Yes  Spiritual Framework  Presenting Themes Meaning/purpose/sources of inspiration;Goals in life/care;Values and beliefs;Courage hope and growth;Impactful experiences and emotions  Patient Stress Factors Major life changes  Family Stress Factors Major life changes  Interventions  Spiritual Care Interventions Made Established relationship of care and support;Compassionate presence;Reflective listening;Prayer;Encouragement;Self-care teaching;Narrative/life review;Bereavement/grief support  Intervention Outcomes  Outcomes Connection to spiritual care;Awareness around self/spiritual resourses;Reduced isolation  Spiritual Care Plan  Spiritual Care Issues Still Outstanding No further spiritual care needs at this time (see row info)    Chaplain received page from nurses station to come and talk with the patient's wife. Doctor was in the room when I arrived and was talking to her about decisions to remove the patient from life support. Chaplain offered compassionate presence, listened reflectively, got Mrs. Mcelwain snacks to eat and prayed with her and the patient. Family member arrived and the chaplain told them if they need me to come back for support to please have her nurse page me.

## 2023-04-29 NOTE — Progress Notes (Signed)
[facility]  ROUNDING NOTE   Subjective:   Bill Peterson is a 88 y.o. male  with past medical history of hypertension, diabetes, BPH, CAD, and chronic kidney disease IV. Patient presents to the ED after being found down on the ground. He has been admitted to ICU for Acidosis [E87.20] Hyperkalemia [E87.5] Acute renal failure, unspecified acute renal failure type (HCC) [N17.9] Acute respiratory failure with hypoxemia (HCC) [J96.01] Acute renal failure superimposed on chronic kidney disease (HCC) [N17.9, N18.9]  Patient is known to our practice and is followed by Dr Wynelle Link. He was last seen in office on Dec 28, 2022 for routine follow up. Patient was found down in his home with weeping legs. Time down unknown. Patient seen and evaluated at bedside in ICU.  Patient currently intubated requiring 30% FiO2.  Sedation with fentanyl however able to open eyes.  Requiring Levophed for blood pressure support.  Significant leg wound noted on left lower extremity, currently being dressed by wound nurse.  Labs on ED arrival show potassium 7.3, bicarb less than 7, BUN 209, creatinine 14.2 with GFR 3, albumin 2.6, and CK 1073. UA appears hazy with hematuria and mild proteinuria.  We have been consulted to monitor AKI. Marland Kitchen   Objective:  Vital signs in last 24 hours:  Temp:  [92 F (33.3 C)-95.2 F (35.1 C)] 93.6 F (34.2 C) (08/02 1145) Pulse Rate:  [53-114] 91 (08/02 1145) Resp:  [16-36] 26 (08/02 1145) BP: (72-137)/(31-66) 106/56 (08/02 1130) SpO2:  [82 %-100 %] 99 % (08/02 1145) FiO2 (%):  [30 %-50 %] 30 % (08/02 0925) Weight:  [80.6 kg] 80.6 kg (08/02 0812)  Weight change:  Filed Weights   04/13/2023 0010 04/10/2023 0812  Weight: 80.6 kg 80.6 kg    Intake/Output: I/O last 3 completed shifts: In: 250 [IV Piggyback:250] Out: -    Intake/Output this shift:  Total I/O In: 877.2 [I.V.:837.2; Other:20; NG/GT:20] Out: 186 [Urine:70]  Physical Exam: General: Ill appearing  Head:  Normocephalic, atraumatic.   Eyes: Anicteric  Lungs:  Rhonchi, vent assisted  Heart: Regular rate and rhythm  Abdomen:  Soft, nontender  Extremities:  No peripheral edema.  Neurologic: Sedated  Skin: No lesions  Access: Left femoral internal jugular     Basic Metabolic Panel: Recent Labs  Lab 04/05/2023 2345 04/27/2023 0449  NA 139 141  K 7.3* 6.4*  CL 110 109  CO2 <7* 11*  GLUCOSE 72 115*  BUN 209* 198*  CREATININE 14.20* 12.73*  CALCIUM 7.9* 7.9*  MG  --  2.7*  PHOS  --  12.9*  12.4*    Liver Function Tests: Recent Labs  Lab 04/25/2023 2345 04/17/2023 0449  AST 47* 64*  ALT 31 36  ALKPHOS 95 109  BILITOT 0.6 1.1  PROT 6.4* 6.1*  ALBUMIN 2.6* 2.4*  2.4*   No results for input(s): "LIPASE", "AMYLASE" in the last 168 hours. No results for input(s): "AMMONIA" in the last 168 hours.  CBC: Recent Labs  Lab 04/04/2023 2345 04/18/2023 0449  WBC 10.7* 11.3*  NEUTROABS 9.9*  --   HGB 9.4* 9.0*  HCT 26.8* 26.1*  MCV 88.4 87.0  PLT 213 200    Cardiac Enzymes: Recent Labs  Lab 04/16/2023 2345  CKTOTAL 1,073*    BNP: Invalid input(s): "POCBNP"  CBG: Recent Labs  Lab 04/19/2023 0126 03/31/2023 0426 04/28/2023 0726 04/07/2023 1139  GLUCAP 90 105* 126* 130*    Microbiology: Results for orders placed or performed during the hospital encounter of  04/22/2023  Blood Culture (routine x 2)     Status: None (Preliminary result)   Collection Time: 04/05/2023 11:35 PM   Specimen: BLOOD  Result Value Ref Range Status   Specimen Description BLOOD BLOOD RIGHT ARM  Final   Special Requests   Final    BOTTLES DRAWN AEROBIC AND ANAEROBIC Blood Culture adequate volume   Culture   Final    NO GROWTH < 12 HOURS Performed at [facility], [address], [place], [place] [address]    Report Status PENDING  Incomplete  Resp panel by RT-PCR (RSV, Flu A&B, Covid) Anterior Nasal Swab     Status: None   Collection Time: 04/09/2023 11:42 PM   Specimen: Anterior Nasal Swab  Result  Value Ref Range Status   SARS Coronavirus 2 by RT PCR NEGATIVE NEGATIVE Final    Comment: (NOTE) SARS-CoV-2 target nucleic acids are NOT DETECTED.  The SARS-CoV-2 RNA is generally detectable in upper respiratory specimens during the acute phase of infection. The lowest concentration of SARS-CoV-2 viral copies this assay can detect is 138 copies/mL. A negative result does not preclude SARS-Cov-2 infection and should not be used as the sole basis for treatment or other patient management decisions. A negative result may occur with  improper specimen collection/handling, submission of specimen other than nasopharyngeal swab, presence of viral mutation(s) within the areas targeted by this assay, and inadequate number of viral copies(<138 copies/mL). A negative result must be combined with clinical observations, patient history, and epidemiological information. The expected result is Negative.  Fact Sheet for Patients:  BloggerCourse.com  Fact Sheet for Healthcare Providers:  SeriousBroker.it  This test is no t yet approved or cleared by the [place] FDA and  has been authorized for detection and/or diagnosis of SARS-CoV-2 by FDA under an Emergency Use Authorization (EUA). This EUA will remain  in effect (meaning this test can be used) for the duration of the COVID-19 declaration under Section 564(b)(1) of the Act, 21 U.S.C.section 360bbb-3(b)(1), unless the authorization is terminated  or revoked sooner.       Influenza A by PCR NEGATIVE NEGATIVE Final   Influenza B by PCR NEGATIVE NEGATIVE Final    Comment: (NOTE) The Xpert Xpress SARS-CoV-2/FLU/RSV plus assay is intended as an aid in the diagnosis of influenza from Nasopharyngeal swab specimens and should not be used as a sole basis for treatment. Nasal washings and aspirates are unacceptable for Xpert Xpress SARS-CoV-2/FLU/RSV testing.  Fact Sheet for  Patients: BloggerCourse.com  Fact Sheet for Healthcare Providers: SeriousBroker.it  This test is not yet approved or cleared by the [place] FDA and has been authorized for detection and/or diagnosis of SARS-CoV-2 by FDA under an Emergency Use Authorization (EUA). This EUA will remain in effect (meaning this test can be used) for the duration of the COVID-19 declaration under Section 564(b)(1) of the Act, 21 U.S.C. section 360bbb-3(b)(1), unless the authorization is terminated or revoked.     Resp Syncytial Virus by PCR NEGATIVE NEGATIVE Final    Comment: (NOTE) Fact Sheet for Patients: BloggerCourse.com  Fact Sheet for Healthcare Providers: SeriousBroker.it  This test is not yet approved or cleared by the [place] FDA and has been authorized for detection and/or diagnosis of SARS-CoV-2 by FDA under an Emergency Use Authorization (EUA). This EUA will remain in effect (meaning this test can be used) for the duration of the COVID-19 declaration under Section 564(b)(1) of the Act, 21 U.S.C. section 360bbb-3(b)(1), unless the authorization is terminated or revoked.  Performed at [facility], [address] [place], [place] [address]   Blood Culture (routine x 2)     Status: None (Preliminary result)   Collection Time: 04/02/2023 11:43 PM   Specimen: BLOOD  Result Value Ref Range Status   Specimen Description BLOOD BLOOD RIGHT ARM  Final   Special Requests   Final    BOTTLES DRAWN AEROBIC AND ANAEROBIC Blood Culture adequate volume   Culture   Final    NO GROWTH < 12 HOURS Performed at [facility], [address] [place], [place] [address]    Report Status PENDING  Incomplete  MRSA Next Gen by PCR, Nasal     Status: None   Collection Time: 04/22/2023  9:33 AM   Specimen: Nasal Mucosa; Nasal Swab  Result Value Ref Range Status   MRSA by PCR Next  Gen NOT DETECTED NOT DETECTED Final    Comment: (NOTE) The GeneXpert MRSA Assay (FDA approved for NASAL specimens only), is one component of a comprehensive MRSA colonization surveillance program. It is not intended to diagnose MRSA infection nor to guide or monitor treatment for MRSA infections. Test performance is not FDA approved in patients less than 14 years old. Performed at [facility], [address] [place], [place] [address]     Coagulation Studies: Recent Labs    04/15/2023 2345  LABPROT 19.0*  INR 1.6*    Urinalysis: Recent Labs    04/12/2023 0222  COLORURINE YELLOW*  LABSPEC 1.014  PHURINE 5.0  GLUCOSEU NEGATIVE  HGBUR MODERATE*  BILIRUBINUR NEGATIVE  KETONESUR NEGATIVE  PROTEINUR 100*  NITRITE NEGATIVE  LEUKOCYTESUR SMALL*      Imaging: CT ABDOMEN PELVIS WO CONTRAST  Result Date: 04/11/2023 CLINICAL DATA:  Sepsis.  Found down. EXAM: CT ABDOMEN AND PELVIS WITHOUT CONTRAST TECHNIQUE: Multidetector CT imaging of the abdomen and pelvis was performed following the standard protocol without IV contrast. RADIATION DOSE REDUCTION: This exam was performed according to the departmental dose-optimization program which includes automated exposure control, adjustment of the mA and/or kV according to patient size and/or use of iterative reconstruction technique. COMPARISON:  05/20/2010 FINDINGS: Lower chest: Streaky density in the lower lungs attributed atelectasis. Cardiomegaly and extensive atheromatous calcification. Aortic calcification. Hepatobiliary: No focal liver abnormality.Layering gallstones. No acute biliary finding or biliary dilatation. Pancreas: Unremarkable. Spleen: Unremarkable. Adrenals/Urinary Tract: Negative adrenals. No hydronephrosis or ureteral stone. Symmetric kidneys with nonspecific perinephric stranding. Punctate left renal calculus. The bladder is collapsed by a Foley catheter. Stomach/Bowel: Postoperative bowel in the right lower quadrant at  site of enteroenteric anastomosis. Right inguinal hernia containing loops of nonobstructed bowel. No bowel obstruction or visible inflammation Vascular/Lymphatic: No acute vascular abnormality. Extensive atheromatous calcification. No mass or adenopathy. Reproductive:Enlarged prostate up lifting the bladder base. Other: No ascites or pneumoperitoneum. Musculoskeletal: No acute abnormalities. Generalized degeneration of the spine with lower lumbar laminectomies. IMPRESSION: 1. No acute intra-abdominal finding. 2. Atherosclerosis, cholelithiasis, and right inguinal hernia containing bowel. Electronically Signed   By: Tiburcio Pea M.D.   On: 04/22/2023 04:57   DG Chest Portable 1 View  Result Date: 03/29/2023 CLINICAL DATA:  Shortness of breath EXAM: PORTABLE CHEST 1 VIEW COMPARISON:  04/24/2023 FINDINGS: Endotracheal intubation with endotracheal tube tip 12 mm above the carina. An enteric tube reaches the stomach. Stable cardiomegaly. Indistinct streaky density in the lower lungs, on contemporaneous abdominal CT this correlates with atelectasis at the lung bases. No visible effusion, edema, or pneumothorax. IMPRESSION: 1. Endotracheal tube with tip 12 mm above  the carina. 2. Located enteric tube. 3. Atelectasis at the lung bases. Electronically Signed   By: Tiburcio Pea M.D.   On: 04/12/2023 04:36   DG Abdomen 1 View  Result Date: 04/13/2023 CLINICAL DATA:  Verify orogastric tube placement EXAM: ABDOMEN - 1 VIEW COMPARISON:  05/20/2010 FINDINGS: Enteric tube with tip and side port at the stomach. Low endotracheal tube and retrocardiac streaky density with pending chest radiograph. The upper abdominal bowel gas pattern is normal without concerning mass effect or calcification. Extensive artifact from EKG leads. IMPRESSION: Enteric tube with tip and side port at the stomach. Electronically Signed   By: Tiburcio Pea M.D.   On: 04/15/2023 04:32   CT HEAD WO CONTRAST ( )  Result Date:  04/27/2023 CLINICAL DATA:  Neck trauma with abnormal mental status. Found down by neighbors house. EXAM: CT HEAD WITHOUT CONTRAST CT CERVICAL SPINE WITHOUT CONTRAST TECHNIQUE: Multidetector CT imaging of the head and cervical spine was performed following the standard protocol without intravenous contrast. Multiplanar CT image reconstructions of the cervical spine were also generated. RADIATION DOSE REDUCTION: This exam was performed according to the departmental dose-optimization program which includes automated exposure control, adjustment of the mA and/or kV according to patient size and/or use of iterative reconstruction technique. COMPARISON:  04/03/2023 head CT. FINDINGS: CT HEAD FINDINGS Brain: No evidence of acute infarction, hemorrhage, hydrocephalus, extra-axial collection or mass lesion/mass effect. Mild cerebral volume loss for age. Vascular: No hyperdense vessel or unexpected calcification. Skull: Normal. Negative for fracture or focal lesion. Sinuses/Orbits: No acute finding. Chronic opacification of the right maxillary sinus with lesser sinus opacification in the ethmoids and sphenoids. CT CERVICAL SPINE FINDINGS Alignment: No traumatic malalignment. Skull base and vertebrae: No acute fracture. No primary bone lesion or focal pathologic process. Soft tissues and spinal canal: No prevertebral fluid or swelling. No visible canal hematoma. Located endotracheal and enteric tubes were covered. Disc levels:  Generalized degenerative endplate and facet spurring. Upper chest: Negative IMPRESSION: No evidence of acute intracranial or cervical spine injury. Electronically Signed   By: Tiburcio Pea M.D.   On: 04/22/2023 04:25   CT CERVICAL SPINE WO CONTRAST  Result Date: 04/04/2023 CLINICAL DATA:  Neck trauma with abnormal mental status. Found down by neighbors house. EXAM: CT HEAD WITHOUT CONTRAST CT CERVICAL SPINE WITHOUT CONTRAST TECHNIQUE: Multidetector CT imaging of the head and cervical spine was  performed following the standard protocol without intravenous contrast. Multiplanar CT image reconstructions of the cervical spine were also generated. RADIATION DOSE REDUCTION: This exam was performed according to the departmental dose-optimization program which includes automated exposure control, adjustment of the mA and/or kV according to patient size and/or use of iterative reconstruction technique. COMPARISON:  04/26/2023 head CT. FINDINGS: CT HEAD FINDINGS Brain: No evidence of acute infarction, hemorrhage, hydrocephalus, extra-axial collection or mass lesion/mass effect. Mild cerebral volume loss for age. Vascular: No hyperdense vessel or unexpected calcification. Skull: Normal. Negative for fracture or focal lesion. Sinuses/Orbits: No acute finding. Chronic opacification of the right maxillary sinus with lesser sinus opacification in the ethmoids and sphenoids. CT CERVICAL SPINE FINDINGS Alignment: No traumatic malalignment. Skull base and vertebrae: No acute fracture. No primary bone lesion or focal pathologic process. Soft tissues and spinal canal: No prevertebral fluid or swelling. No visible canal hematoma. Located endotracheal and enteric tubes were covered. Disc levels:  Generalized degenerative endplate and facet spurring. Upper chest: Negative IMPRESSION: No evidence of acute intracranial or cervical spine injury. Electronically Signed   By: Audry Riles.D.  On: 04/22/2023 04:25   CT HEAD WO CONTRAST ( )  Result Date: 04/26/2023 CLINICAL DATA:  Mental status change EXAM: CT HEAD WITHOUT CONTRAST TECHNIQUE: Contiguous axial images were obtained from the base of the skull through the vertex without intravenous contrast. RADIATION DOSE REDUCTION: This exam was performed according to the departmental dose-optimization program which includes automated exposure control, adjustment of the mA and/or kV according to patient size and/or use of iterative reconstruction technique. COMPARISON:  None  Available. FINDINGS: Brain: No acute territorial infarction or intracranial mass. Possible trace 1-2 mm subdural collection on coronal series 4, image 26, with an appearance suggesting subacute to developing chronic subdural collection. No acute hemorrhage is visualized. Moderate atrophy. Nonenlarged ventricles. Mild white matter chronic small vessel ischemic change. Vascular: No hyperdense vessels. Vertebral and carotid vascular calcifications Skull: No fracture Sinuses/Orbits: Opacified right maxillary sinus with moderate mucosal thickening. Other: Nonspecific gas in the soft tissues lateral to the right orbit. IMPRESSION: 1. Possible trace 1-2 mm subdural collection on the left, with an appearance suggesting subacute to developing chronic subdural collection. No acute hemorrhage is visualized. 2. Atrophy and chronic small vessel ischemic change of the white matter Electronically Signed   By: Jasmine Pang M.D.   On: 04/03/2023 00:24   DG Chest Port 1 View  Result Date: 03/29/2023 CLINICAL DATA:  Possible sepsis EXAM: PORTABLE CHEST 1 VIEW COMPARISON:  09/06/2017 FINDINGS: Mild cardiomegaly. No acute airspace disease or effusion. Aortic atherosclerosis. No pneumothorax. IMPRESSION: Mild cardiomegaly. Electronically Signed   By: Jasmine Pang M.D.   On: 04/07/2023 00:16     Medications:    anticoagulant sodium citrate     [START ON 03/31/2023] ceFEPime (MAXIPIME) IV     fentaNYL infusion INTRAVENOUS 200 mcg/hr (04/15/2023 1204)   norepinephrine (LEVOPHED) Adult infusion 12 mcg/min (04/14/2023 1204)   PrismaSol BGK 2/3.5 500 mL/hr at 04/25/2023 0745   PrismaSol BGK 2/3.5 400 mL/hr at 04/28/2023 0745   PrismaSol BGK 2/3.5 2,500 mL/hr at 04/05/2023 1145   [START ON 03/31/2023] vancomycin     vasopressin Stopped (04/17/2023 0745)    atorvastatin  20 mg Per Tube Daily   Chlorhexidine Gluconate Cloth  6 each Topical Daily   docusate  100 mg Per Tube BID   etomidate  20 mg Intravenous Once   midazolam  4 mg  Intravenous Once   mouth rinse  15 mL Mouth Rinse Q2H   mouth rinse  15 mL Mouth Rinse Q2H   pantoprazole (PROTONIX) IV  40 mg Intravenous QHS   polyethylene glycol  17 g Per Tube Daily   rocuronium bromide  80 mg Intravenous Once   anticoagulant sodium citrate, docusate sodium, fentaNYL, midazolam, mouth rinse, mouth rinse, polyethylene glycol  Assessment/ Plan:  Bill Peterson is a 88 y.o.  male with past medical history of hypertension, diabetes, BPH, CAD, and chronic kidney disease IV. Patient presents to the ED after being found down on the ground. He has been admitted to ICU for Acidosis [E87.20] Hyperkalemia [E87.5] Acute renal failure, unspecified acute renal failure type (HCC) [N17.9] Acute respiratory failure with hypoxemia (HCC) [J96.01] Acute renal failure superimposed on chronic kidney disease (HCC) [N17.9, N18.9]   Acute Kidney Injury with hyperkalemia on chronic kidney disease stage IV with baseline creatinine 2.99 and GFR of 20 on 12/19/22.  Acute kidney injury secondary to ATN due to suspected wound infection Chronic kidney disease is secondary to diabetes and hypertension.  Potassium 7.3 on admission. Acidotic. Placed on CRRT once line placed.  Remains on 2K bath, may transition to 4K later today. Net even for now.   Lab Results  Component Value Date   CREATININE 12.73 (H) 04/02/2023   CREATININE 14.20 (H) 04/06/2023   CREATININE 1.76 (H) 09/06/2017    Intake/Output Summary (Last 24 hours) at 04/23/2023 1227 Last data filed at 04/26/2023 1204 Gross per 24 hour  Intake 1127.17 ml  Output 186 ml  Net 941.17 ml   2. Acute metabolic acidosis, s bicarb less than 7 on admission. Will correct with CRRT.    3. Acute on chronic heart failure, ECHO pending.   4. Diabetes mellitus type II with chronic kidney disease/renal manifestations: noninsulin dependent. Most recent hemoglobin A1c is 7.1 on 10/17/22.     LOS: 0 Bill Peterson 8/2/202412:27 PM

## 2023-04-29 NOTE — Progress Notes (Signed)
PHARMACY CONSULT NOTE  Pharmacy Consult for Electrolyte Monitoring and Replacement   Recent Labs: Potassium (mmol/L)  Date Value  04/08/2023 6.4 (HH)   Magnesium (mg/dL)  Date Value  40/98/1191 2.7 (H)   Calcium (mg/dL)  Date Value  47/82/9562 7.9 (L)   Albumin (g/dL)  Date Value  13/03/6577 2.4 (L)  04/26/2023 2.4 (L)   Phosphorus (mg/dL)  Date Value  46/96/2952 12.4 (H)  04/20/2023 12.9 (H)   Sodium (mmol/L)  Date Value  04/07/2023 141     Assessment: 88 year old male with history of multiple medical including CKD, T2DM, CAD, HFpEF and AS who presents to the hospital after being found down for an unknown period of time. CRRT started today  Goal of Therapy:  Electrolytes WNL  Plan:  ---no electrolyte replacement warranted for now ---renal function panel BID per CRRT protocol  Lowella Bandy ,PharmD Clinical Pharmacist 04/20/2023 10:01 AM

## 2023-04-29 NOTE — Sepsis Progress Note (Signed)
Notified provider of need to order repeat lactic acid. ° °

## 2023-04-29 NOTE — Procedures (Signed)
Intubation Procedure Note  BRYANN MCNEALY  [id number]  1935/03/19  Date:04/19/2023  Time:3:17 AM   Provider Performing: L Rust-Chester    Procedure: Intubation (31500)  Indication(s) Respiratory Failure  Consent Risks of the procedure as well as the alternatives and risks of each were explained to the patient and/or caregiver.  Consent for the procedure was obtained and is signed in the bedside chart   Anesthesia Etomidate, Versed, Fentanyl, and Rocuronium   Time Out Verified patient identification, verified procedure, site/side was marked, verified correct patient position, special equipment/implants available, medications/allergies/relevant history reviewed, required imaging and test results available.   Sterile Technique Usual hand hygeine, masks, and gloves were used   Procedure Description Patient positioned in bed supine.  Sedation given as noted above.  Patient was intubated with endotracheal tube using Glidescope.  View was Grade 1 full glottis .  Number of attempts was 1.  Colorimetric CO2 detector was consistent with tracheal placement.   Complications/Tolerance None; patient tolerated the procedure well. Chest X-ray is ordered to verify placement.   EBL Minimal   Specimen(s) None   Betsey Holiday, AGACNP-BC Acute Care Nurse Practitioner [facility]   [phone] / [phone] Please see Amion for details.

## 2023-04-29 NOTE — Progress Notes (Signed)
Pt extubated to comfort care.  

## 2023-04-29 NOTE — Progress Notes (Signed)
   04/14/2023 1600  Spiritual Encounters  Conversation partners present during encounter Physician;Nurse  Referral source Nurse (RN/NT/LPN)  Reason for visit End-of-life  OnCall Visit Yes  Spiritual Framework  Presenting Themes Impactful experiences and emotions;Courage hope and growth  Family Stress Factors Loss  Interventions  Spiritual Care Interventions Made Compassionate presence;Reflective listening;Bereavement/grief support;Supported grief process;Provided grief education;Prayer  Intervention Outcomes  Outcomes Awareness around self/spiritual resourses;Awareness of support  Spiritual Care Plan  Spiritual Care Issues Still Outstanding No further spiritual care needs at this time (see row info)   Chaplain recvd a call that the patient was being put on comfort care. Chaplain went to sit with the patient and family. Chaplain prayed over the patient and offered comfort and care to the family. Chaplain sang with the family after the passing of the patient. Chaplain will tell oncoming chaplain to go by and check on the family.

## 2023-04-29 NOTE — Progress Notes (Signed)
CODE SEPSIS - PHARMACY COMMUNICATION  **Broad Spectrum Antibiotics should be administered within 1 hour of Sepsis diagnosis**  Time Code Sepsis Called/Page Received: 8/1 @ 2331   Antibiotics Ordered: Vanc, cefepime, metronidazole   Time of 1st antibiotic administration: metronidazole 500 mg IV X 1 on 8/2 @ 0017  Additional action taken by pharmacy:   If necessary, Name of Provider/Nurse Contacted:     , D ,PharmD Clinical Pharmacist  04/18/2023  12:27 AM

## 2023-04-29 NOTE — Progress Notes (Signed)
Late entering. Pt extubated to comfort care per family request and Pt PEA at 1555 is time of death.

## 2023-04-29 NOTE — H&P (Signed)
NAME:  Bill Peterson, MRN:  [id number], DOB:  January 27, 1935, LOS: 0 ADMISSION DATE:  04/13/2023, CONSULTATION DATE:  04/20/2023 REFERRING MD:  Dr. Erma Heritage, CHIEF COMPLAINT: Fall & CODE Sepsis    History of Present Illness:  88 yo M presenting to [facility] ED from home via EMS for evaluation of AMS.  History provided per chart review and family bedside interview.  Patient was initially alert and responsive now encephalopathic and unable to participate in interview. Patient was found down on the ground in his house and he was unsure how long he had been on the ground. He initially though he had fallen today (8/1) but could recall details. He does not recall hitting his head. He was found with debris stuck to his legs with significant weeping noted from BLE.  On arrival to ED patient endorsed fatigue, but has no other specific complaints. He did report mild bilateral leg pain but reported this was somewhat chronic and his being followed at the wound care clinic for the wounds on his BLE s/t chronic edema. Spouse who is bedside reported she last spoke with the patient the evening of Tuesday, 03/27/2023 unless she has been away since then.  She attempted to call him at the house but he did not pick up the phone on Wednesday or Thursday.  She reports him as having no significant complaints recently.  However she has observed over the last 3 weeks that he appears more fatigued and weak as well as some confusion, while still able to perform usual ADL tasks independently.  He has had poor p.o. intake surviving on peanut butter and jelly.  He does have a history of 2 falls within this last year.  She also described 50 pound weight loss since Christmas.  She denies fever/chills, cough, shortness of breath but that otherwise she is not sure he would confide in any other symptoms even if they were occurring. She denies any tobacco use history, EtOH or illegal drug use history and the patient has no chronic pain needs. EMS reported  initial temperature is 92 and initial blood pressure 82/36, he was administered a liter of fluid en route.  ED course: Upon arrival the patient was alert and responsive able to participate in interview with EDP, see HPI above.  Labs significant for acute renal failure with creatinine greater than 14 and BUN greater than 200.  He is also hyperkalemic with a severe metabolic acidosis and mild leukocytosis.  Initial lactic WNL with a trend up to 2.5 on repeat lactic.  Mild transaminitis and hypoalbuminemia, with a very elevated BNP greater than 3000 & and CK greater than 1000.  Patient was still voiding EDP spoke with nephrology on-call who recommended sodium bicarbonate drip trial.  However, while in ED patient became more encephalopathic, with increased work of breathing and wheezing post IV fluid boluses.  Discussion with family regarding goals of care, need for CRRT, decision made to urgently intubate and place the patient on mechanical ventilatory support.  Nephrology in agreement with initiating CRRT. Medications given: albuterol, insulin & D50, Ca gluconate, sodium bicarb amp + drip, 2.5 L IVF bolus, cefepime/flagyl/vancomycin Initial Vitals: 92, 26, 77, 92/31 & 94% on RA Significant labs: (Labs/ Imaging personally reviewed) I, Cheryll Cockayne Rust-Chester, AGACNP-BC, personally viewed and interpreted this ECG. EKG Interpretation: Date: 04/15/2023, EKG Time: 23:51, Rate: 77, Rhythm: NSR, QRS Axis: normal Intervals: prolonged PR & Qtc, ST/T Wave abnormalities: non specific T wave abnormalities, Narrative Interpretation: NSR with 1st degree HB  and prolonged Qtc Chemistry: Na+: 139, K+: 7.3, BUN/Cr.: 209/14.2, Serum CO2/ AG: <7/not calc., albumin: 2.6, AST: 47 CK: 1073 Hematology: WBC: 10.7, Hgb: 9.4,  BNP: 3125.5, Lactic: 1.5, COVID-19 & Influenza A/B: negative  VBG: 7.17/ <18/55/5.8 CXR 04/03/2023: mild cardiomegaly CT head wo contrast 03/29/2023: Possible trace 1-2 mm subdural collection on the left, with  an appearance suggesting subacute to developing chronic subdural collection. No acute hemorrhage is visualized. Atrophy and chronic small vessel ischemic change of the white matter  PCCM consulted for admission due to acute renal failure superimposed on CKD stage IV with acute metabolic encephalopathy and acute hypoxic respiratory failure in the setting of acute on chronic HFpEF requiring urgent intubation and mechanical ventilatory support.  Also concern for bilateral lower extremity cellulitis.  Pertinent  Medical History  T2DM HLD HTN Moderate AS CAD s/p MI (1996) HFpEF CKD Stage IV Anemia of Chronic Disease (managed by hematology/oncology) BPH Gastric Stromal tumor Significant Hospital Events: Including procedures, antibiotic start and stop dates in addition to other pertinent events   04/02/2023: Admit to ICU with acute renal failure superimposed on CKD stage IV requiring urgent CRRT and with acute metabolic encephalopathy and acute hypoxic respiratory failure in the setting of acute on chronic HFpEF requiring urgent intubation and mechanical ventilatory support.  Also concern for bilateral lower extremity cellulitis.  Interim History / Subjective:  Patient awake but acutely encephalopathic, increasingly agitated and fidgety, unable to answer questions.  Work of breathing progressively worsening.  Discussions with wife bedside and children via telephone conference.  Decision made to urgently intubate patient and placed him on mechanical ventilatory support in order to facilitate CRRT.  Objective   Blood pressure (!) 74/56, pulse 85, temperature (!) 92.5 F (33.6 C), temperature source Bladder, resp. rate (!) 24, height 5\' 6"  (1.676 m), weight 80.6 kg, SpO2 99%.       No intake or output data in the 24 hours ending 04/07/2023 0143 Filed Weights   04/02/2023 0010  Weight: 80.6 kg    Examination: General: Adult male, critically ill, lying in bed-increased work of breathing HEENT: MM  pink/moist, anicteric, atraumatic, neck supple Neuro: Altered unable to answer orientation questions, able to follow some simple intermittent commands, PERRL +3, MAE CV: s1s2 RRR, NSR on monitor, no r/m/g Pulm: Regular, labored on room air, breath sounds expiratory/inspiratory wheezing-BUL & diminished-BLL GI: soft, hernia- rounded, non tender, bs x 4 GU: foley in place with clear red urine Skin: extensive bilateral lower extremity wounds  Extremities: warm/dry, pulses + 2 R/P, trace edema noted  Resolved Hospital Problem list     Assessment & Plan:  Acute renal failure superimposed on CKD stage IV suspect prerenal secondary to circulatory shock Hyperkalemia Anion gap metabolic acidosis Baseline Cr: 2.8, Cr on admission: 14.20 Shifting measures administered for hyperkalemia: Insulin, calcium, bicarb - CRRT initiated per nephrology, will need central access -Continue sodium bicarbonate drip until CRRT initiated - Strict I/O's: alert provider if UOP < 0.5 mL/kg/hr - Follow-up potassium > Daily BMP, replace electrolytes PRN - Avoid nephrotoxic agents as able, ensure adequate renal perfusion - Nephrology following, appreciate input   Acute Hypoxic Respiratory Failure secondary to acute metabolic encephalopathy in the setting of acute renal failure superimposed on CKD stage IV  - Ventilator settings: PRVC  8 mL/kg, 50% FiO2, 5 PEEP, continue ventilator support & lung protective strategies - Wean PEEP & FiO2 as tolerated, maintain SpO2 > 90% - Head of bed elevated 30 degrees, VAP protocol in place [place]  pressures less than 30 cm H20  - Intermittent chest x-ray & ABG PRN - Daily WUA with SBT as tolerated  - Ensure adequate pulmonary hygiene  - Bronchodilators PRN - PAD protocol in place: continue Fentanyl drip & Versed IVP  Acute on Chronic HFrEF exacerbation Moderate aortic stenosis PMHx: HFpEF, HTN, HLD - Echocardiogram ordered -Hold outpatient lisinopril and carvedilol,  continue Lipitor - Continuous cardiac monitoring  - Daily weights to assess volume status - Consider cardiology consult depending on echocardiogram results  Suspected Sepsis due to suspected bilateral cellulitis Circulatory shock Lactic: 1.5 > 2.5 Initial interventions/workup included: 2.5 L of NS/LR & Cefepime/ Vancomycin/ Metronidazole-  - f/u cultures, trend lactic, stat CT abdomen and pelvis without contrast - Daily CBC, monitor WBC/ fever curve - IV antibiotics: cefepime & vancomycin  - Continue vasopressors to maintain MAP< 65: norepinephrine & vasopressin PRN - consult WOC  Acute metabolic encephalopathy suspect secondary to acute renal failure and uremia 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 intake -Consult dietary  Best Practice (right click and "Reselect all SmartList Selections" daily)  Diet/type: NPO w/ meds via tube DVT prophylaxis: other > consider heparin SQ, hold for now due to hematuria and possible chronic trace subdural hematoma GI prophylaxis: H2B Lines: Dialysis Catheter and yes and it is still needed Foley:  Yes, and it is still needed Code Status:  full code Last date of multidisciplinary goals of care discussion [04/16/2023]  Labs   CBC: Recent Labs  Lab 04/22/2023 2345  WBC 10.7*  NEUTROABS 9.9*  HGB 9.4*  HCT 26.8*  MCV 88.4  PLT 213    Basic Metabolic Panel: Recent Labs  Lab 04/17/2023 2345  NA 139  K 7.3*  CL 110  CO2 <7*  GLUCOSE 72  BUN 209*  CREATININE 14.20*  CALCIUM 7.9*   GFR: Estimated Creatinine Clearance: 3.8 mL/min (A) (by C-G formula based on SCr of 14.2 mg/dL  (H)). Recent Labs  Lab 04/01/2023 2342 04/24/2023 2345  WBC  --  10.7*  LATICACIDVEN 1.5  --     Liver Function Tests: Recent Labs  Lab 04/02/2023 2345  AST 47*  ALT 31  ALKPHOS 95  BILITOT 0.6  PROT 6.4*  ALBUMIN 2.6*   No results for input(s): "LIPASE", "AMYLASE" in the last 168 hours. No results for input(s): "AMMONIA" in the last 168 hours.  ABG No results found for: "PHART", "PCO2ART", "PO2ART", "HCO3", "TCO2", "ACIDBASEDEF", "O2SAT"   Coagulation Profile: Recent Labs  Lab 04/07/2023 2345  INR 1.6*    Cardiac Enzymes: Recent Labs  Lab 04/24/2023 2345  CKTOTAL 1,073*    HbA1C: Hgb A1c MFr Bld  Date/Time Value Ref Range Status  08/15/2017 08:12 PM 6.9 (H) 4.8 - 5.6 % Final    Comment:    (NOTE) Pre diabetes:          5.7%-6.4% Diabetes:              >6.4% Glycemic control for   <7.0% adults with diabetes   03/29/2007 06:20 PM (H)  Final   10.1 (NOTE)   The ADA recommends the following therapeutic goals for glycemic   control related to Hgb A1C  measurement:   Goal of Therapy:   < 7.0% Hgb A1C   Action Suggested:  > 8.0% Hgb A1C   Ref:  Diabetes Care, 22, Suppl. 1, 1999    CBG: Recent Labs  Lab 04/08/2023 0126  GLUCAP 90    Review of Systems:   UTA patient unable to participate in interview at this time Past Medical History:  He,  has a past medical history of BPH (benign prostatic hyperplasia), CHF (congestive heart failure) (HCC), Diabetes mellitus without complication (HCC), Hyperlipidemia, and Hypertension.   Surgical History:   Past Surgical History:  Procedure Laterality Date   BACK SURGERY     BIOPSY PROSTATE  08/2018   CARDIAC SURGERY     STOMACH SURGERY       Social History:   reports that he has never smoked. He has never been exposed to tobacco smoke. He has never used smokeless tobacco. He reports that he does not drink alcohol and does not use drugs.   Family History:  His family history includes Diabetes in his father; Heart disease  in his mother. There is no history of Prostate cancer, Kidney cancer, or Bladder Cancer.   Allergies Allergies  Allergen Reactions   Heparin     REACTION: Unspecified   Repaglinide Other (See Comments)    Increased appetite Increased appetite Increased appetite      Home Medications  Prior to Admission medications   Medication Sig Start Date End Date Taking? Authorizing Provider  atorvastatin (LIPITOR) 20 MG tablet Take 20 mg by mouth daily.   Yes [provider]  calcitRIOL (ROCALTROL) 0.25 MCG capsule Take by mouth. 07/12/22 07/12/23 Yes [provider]  carvedilol (COREG) 6.25 MG tablet Take by mouth. 02/20/23 02/20/24 Yes [provider]  lisinopril (ZESTRIL) 10 MG tablet Take 10 mg by mouth daily. 03/08/23  Yes [provider]  aspirin 325 MG tablet Take 325 mg by mouth daily.    [provider]  Cholecalciferol (VITAMIN D3) 10 MCG (400 UNIT) tablet Take 400 Units by mouth daily.    [provider]  CINNAMON PO Take 1,000 tablets by mouth daily at 6 (six) AM.    [provider]  Ferrous Sulfate (IRON) 325 (65 Fe) MG TABS Take 1 tablet by mouth daily.    [provider]  Garlic 1000 MG CAPS Take 1,000 mg by mouth daily at 6 (six) AM.    [provider]  Ginger, Zingiber officinalis, (GINGER ROOT) 550 MG CAPS Take 1 capsule by mouth daily at 6 (six) AM.    [provider]  magnesium oxide (MAG-OX) 400 MG tablet Take 500 mg by mouth daily.    [provider]  Misc Natural Products (APPLE CIDER VINEGAR DIET PO) Take 450 mg by mouth daily.    [provider]  Multiple Vitamin (MULTIVITAMIN) capsule Take 1 capsule by mouth daily.    [provider]  omeprazole (PRILOSEC) 20 MG capsule Take 20 mg by mouth daily.    [provider]  Potassium 99 MG TABS Take 99 mg by mouth daily as needed.    [provider]  tamsulosin (FLOMAX) 0.4 MG CAPS capsule Take 1  capsule (0.4 mg total) by mouth 2 (two) times daily. Patient not taking: Reported on 04/18/2023 07/02/19   Riki Altes, MD  torsemide (DEMADEX) 20 MG tablet Take 20 mg by mouth daily. Patient not taking: Reported on 03/29/2023    [provider]  Turmeric 500 MG CAPS  Take 500 mg by mouth daily.    [provider]  vitamin E 400 UNIT capsule Take 400 Units by mouth daily.    [provider]     Critical care time: 70 minutes       Betsey Holiday, AGACNP-BC Acute Care Nurse Practitioner [facility]   [phone] / [phone] Please see Amion for details.

## 2023-04-29 DEATH — deceased
